# Patient Record
Sex: Female | Born: 1970 | Race: White | Hispanic: No | Marital: Married | Smoking: Never smoker
Health system: Southern US, Community
[De-identification: ages and names within clinical notes are randomized; demographics above are authoritative.]

## PROBLEM LIST (undated history)

## (undated) DIAGNOSIS — K5900 Constipation, unspecified: Secondary | ICD-10-CM

## (undated) DIAGNOSIS — R768 Other specified abnormal immunological findings in serum: Secondary | ICD-10-CM

## (undated) DIAGNOSIS — D509 Iron deficiency anemia, unspecified: Secondary | ICD-10-CM

## (undated) DIAGNOSIS — C8307 Small cell B-cell lymphoma, spleen: Secondary | ICD-10-CM

## (undated) DIAGNOSIS — N924 Excessive bleeding in the premenopausal period: Secondary | ICD-10-CM

## (undated) DIAGNOSIS — D472 Monoclonal gammopathy: Secondary | ICD-10-CM

## (undated) HISTORY — DX: Small cell b-cell lymphoma, spleen: C83.07

## (undated) HISTORY — DX: Iron deficiency anemia, unspecified: D50.9

## (undated) HISTORY — DX: Constipation, unspecified: K59.00

## (undated) HISTORY — DX: Monoclonal gammopathy: D47.2

## (undated) HISTORY — DX: Excessive bleeding in the premenopausal period: N92.4

## (undated) HISTORY — DX: Other specified abnormal immunological findings in serum: R76.8

## (undated) HISTORY — PX: CHOLECYSTECTOMY: SHX55

---

## 1998-07-09 ENCOUNTER — Other Ambulatory Visit: Admission: RE | Admit: 1998-07-09 | Discharge: 1998-07-09 | Payer: Self-pay | Admitting: Obstetrics and Gynecology

## 1999-08-02 ENCOUNTER — Other Ambulatory Visit: Admission: RE | Admit: 1999-08-02 | Discharge: 1999-08-02 | Payer: Self-pay | Admitting: Obstetrics and Gynecology

## 2000-07-10 ENCOUNTER — Other Ambulatory Visit: Admission: RE | Admit: 2000-07-10 | Discharge: 2000-07-10 | Payer: Self-pay | Admitting: Obstetrics and Gynecology

## 2001-08-09 ENCOUNTER — Other Ambulatory Visit: Admission: RE | Admit: 2001-08-09 | Discharge: 2001-08-09 | Payer: Self-pay | Admitting: Obstetrics and Gynecology

## 2002-05-12 ENCOUNTER — Other Ambulatory Visit: Admission: RE | Admit: 2002-05-12 | Discharge: 2002-05-12 | Payer: Self-pay | Admitting: Obstetrics and Gynecology

## 2002-12-05 ENCOUNTER — Inpatient Hospital Stay (HOSPITAL_COMMUNITY): Admission: AD | Admit: 2002-12-05 | Discharge: 2002-12-08 | Payer: Self-pay | Admitting: Obstetrics and Gynecology

## 2002-12-09 ENCOUNTER — Encounter: Admission: RE | Admit: 2002-12-09 | Discharge: 2003-01-08 | Payer: Self-pay | Admitting: Obstetrics and Gynecology

## 2002-12-15 ENCOUNTER — Inpatient Hospital Stay (HOSPITAL_COMMUNITY): Admission: AD | Admit: 2002-12-15 | Discharge: 2002-12-15 | Payer: Self-pay | Admitting: Obstetrics & Gynecology

## 2002-12-15 ENCOUNTER — Encounter: Payer: Self-pay | Admitting: Obstetrics & Gynecology

## 2003-01-16 ENCOUNTER — Other Ambulatory Visit: Admission: RE | Admit: 2003-01-16 | Discharge: 2003-01-16 | Payer: Self-pay | Admitting: Obstetrics and Gynecology

## 2004-01-18 ENCOUNTER — Other Ambulatory Visit: Admission: RE | Admit: 2004-01-18 | Discharge: 2004-01-18 | Payer: Self-pay | Admitting: Obstetrics and Gynecology

## 2004-08-08 ENCOUNTER — Ambulatory Visit: Payer: Self-pay | Admitting: Internal Medicine

## 2005-03-06 ENCOUNTER — Other Ambulatory Visit: Admission: RE | Admit: 2005-03-06 | Discharge: 2005-03-06 | Payer: Self-pay | Admitting: Obstetrics and Gynecology

## 2005-04-07 ENCOUNTER — Ambulatory Visit: Payer: Self-pay | Admitting: Unknown Physician Specialty

## 2005-04-23 ENCOUNTER — Ambulatory Visit: Payer: Self-pay | Admitting: Unknown Physician Specialty

## 2005-06-17 ENCOUNTER — Ambulatory Visit: Payer: Self-pay | Admitting: Surgery

## 2005-08-15 ENCOUNTER — Ambulatory Visit: Payer: Self-pay | Admitting: Psychiatry

## 2006-06-18 ENCOUNTER — Ambulatory Visit: Payer: Self-pay | Admitting: Psychiatry

## 2006-06-22 ENCOUNTER — Ambulatory Visit: Payer: Self-pay | Admitting: Obstetrics and Gynecology

## 2006-07-03 ENCOUNTER — Ambulatory Visit: Payer: Self-pay | Admitting: Obstetrics and Gynecology

## 2007-01-07 ENCOUNTER — Ambulatory Visit (HOSPITAL_COMMUNITY): Admission: RE | Admit: 2007-01-07 | Discharge: 2007-01-07 | Payer: Self-pay | Admitting: Obstetrics and Gynecology

## 2007-02-11 ENCOUNTER — Ambulatory Visit (HOSPITAL_COMMUNITY): Admission: RE | Admit: 2007-02-11 | Discharge: 2007-02-11 | Payer: Self-pay | Admitting: Obstetrics and Gynecology

## 2007-03-11 ENCOUNTER — Ambulatory Visit (HOSPITAL_COMMUNITY): Admission: RE | Admit: 2007-03-11 | Discharge: 2007-03-11 | Payer: Self-pay | Admitting: Obstetrics and Gynecology

## 2007-03-15 ENCOUNTER — Ambulatory Visit: Payer: Self-pay | Admitting: Oncology

## 2007-03-17 ENCOUNTER — Ambulatory Visit (HOSPITAL_COMMUNITY): Admission: RE | Admit: 2007-03-17 | Discharge: 2007-03-17 | Payer: Self-pay | Admitting: Oncology

## 2007-03-17 LAB — CBC & DIFF AND RETIC
Basophils Absolute: 0 10*3/uL (ref 0.0–0.1)
EOS%: 1 % (ref 0.0–7.0)
HCT: 32.2 % — ABNORMAL LOW (ref 34.8–46.6)
HGB: 11.7 g/dL (ref 11.6–15.9)
IRF: 0.43 — ABNORMAL HIGH (ref 0.130–0.330)
MCH: 33.6 pg (ref 26.0–34.0)
MONO#: 0.4 10*3/uL (ref 0.1–0.9)
NEUT#: 5.9 10*3/uL (ref 1.5–6.5)
NEUT%: 67.7 % (ref 39.6–76.8)
RDW: 13.9 % (ref 11.3–14.5)
RETIC #: 116.2 10*3/uL — ABNORMAL HIGH (ref 19.7–115.1)
WBC: 8.7 10*3/uL (ref 3.9–10.0)
lymph#: 2.3 10*3/uL (ref 0.9–3.3)

## 2007-03-17 LAB — CHCC SMEAR

## 2007-03-17 LAB — MORPHOLOGY: PLT EST: ADEQUATE

## 2007-03-19 LAB — COMPREHENSIVE METABOLIC PANEL
ALT: 13 U/L (ref 0–35)
AST: 12 U/L (ref 0–37)
Calcium: 8.6 mg/dL (ref 8.4–10.5)
Chloride: 106 mEq/L (ref 96–112)
Creatinine, Ser: 0.69 mg/dL (ref 0.40–1.20)
Sodium: 138 mEq/L (ref 135–145)
Total Bilirubin: 0.4 mg/dL (ref 0.3–1.2)
Total Protein: 6.6 g/dL (ref 6.0–8.3)

## 2007-03-19 LAB — IMMUNOFIXATION ELECTROPHORESIS
IgA: 265 mg/dL (ref 68–378)
IgM, Serum: 376 mg/dL — ABNORMAL HIGH (ref 60–263)

## 2007-03-31 ENCOUNTER — Ambulatory Visit (HOSPITAL_COMMUNITY): Admission: RE | Admit: 2007-03-31 | Discharge: 2007-03-31 | Payer: Self-pay | Admitting: Obstetrics and Gynecology

## 2007-04-29 ENCOUNTER — Ambulatory Visit: Payer: Self-pay | Admitting: Oncology

## 2007-05-03 ENCOUNTER — Ambulatory Visit (HOSPITAL_COMMUNITY): Admission: RE | Admit: 2007-05-03 | Discharge: 2007-05-03 | Payer: Self-pay | Admitting: Obstetrics and Gynecology

## 2007-05-03 LAB — CBC & DIFF AND RETIC
Eosinophils Absolute: 0.1 10*3/uL (ref 0.0–0.5)
LYMPH%: 25.5 % (ref 14.0–48.0)
MCV: 92.7 fL (ref 81.0–101.0)
MONO%: 4.3 % (ref 0.0–13.0)
NEUT#: 5.8 10*3/uL (ref 1.5–6.5)
Platelets: 192 10*3/uL (ref 145–400)
RBC: 3.31 10*6/uL — ABNORMAL LOW (ref 3.70–5.32)
Retic %: 3.2 % — ABNORMAL HIGH (ref 0.4–2.3)

## 2007-05-11 ENCOUNTER — Ambulatory Visit (HOSPITAL_COMMUNITY): Admission: RE | Admit: 2007-05-11 | Discharge: 2007-05-11 | Payer: Self-pay | Admitting: Obstetrics and Gynecology

## 2007-05-18 ENCOUNTER — Ambulatory Visit (HOSPITAL_COMMUNITY): Admission: RE | Admit: 2007-05-18 | Discharge: 2007-05-18 | Payer: Self-pay | Admitting: Obstetrics and Gynecology

## 2007-05-18 LAB — CBC & DIFF AND RETIC
Basophils Absolute: 0.1 10*3/uL (ref 0.0–0.1)
Eosinophils Absolute: 0.1 10*3/uL (ref 0.0–0.5)
HCT: 30.6 % — ABNORMAL LOW (ref 34.8–46.6)
HGB: 11.1 g/dL — ABNORMAL LOW (ref 11.6–15.9)
LYMPH%: 24.6 % (ref 14.0–48.0)
MCV: 90 fL (ref 81.0–101.0)
MONO%: 3.7 % (ref 0.0–13.0)
NEUT#: 6.2 10*3/uL (ref 1.5–6.5)
NEUT%: 70.2 % (ref 39.6–76.8)
Platelets: 179 10*3/uL (ref 145–400)
RBC: 3.4 10*6/uL — ABNORMAL LOW (ref 3.70–5.32)

## 2007-05-25 ENCOUNTER — Ambulatory Visit (HOSPITAL_COMMUNITY): Admission: RE | Admit: 2007-05-25 | Discharge: 2007-05-25 | Payer: Self-pay | Admitting: Obstetrics and Gynecology

## 2007-06-01 ENCOUNTER — Ambulatory Visit (HOSPITAL_COMMUNITY): Admission: RE | Admit: 2007-06-01 | Discharge: 2007-06-01 | Payer: Self-pay | Admitting: Obstetrics and Gynecology

## 2007-06-01 LAB — CBC & DIFF AND RETIC
Basophils Absolute: 0 10*3/uL (ref 0.0–0.1)
Eosinophils Absolute: 0.1 10*3/uL (ref 0.0–0.5)
HGB: 11.2 g/dL — ABNORMAL LOW (ref 11.6–15.9)
LYMPH%: 25.9 % (ref 14.0–48.0)
MCV: 92.2 fL (ref 81.0–101.0)
MONO#: 0.4 10*3/uL (ref 0.1–0.9)
MONO%: 4.4 % (ref 0.0–13.0)
NEUT#: 6.3 10*3/uL (ref 1.5–6.5)
Platelets: 195 10*3/uL (ref 145–400)
RETIC #: 95.9 10*3/uL (ref 19.7–115.1)

## 2007-06-08 ENCOUNTER — Ambulatory Visit (HOSPITAL_COMMUNITY): Admission: RE | Admit: 2007-06-08 | Discharge: 2007-06-08 | Payer: Self-pay | Admitting: Obstetrics and Gynecology

## 2007-06-10 ENCOUNTER — Ambulatory Visit: Payer: Self-pay | Admitting: Oncology

## 2007-06-15 LAB — CBC & DIFF AND RETIC
Basophils Absolute: 0 10*3/uL (ref 0.0–0.1)
Eosinophils Absolute: 0.1 10*3/uL (ref 0.0–0.5)
HGB: 11.7 g/dL (ref 11.6–15.9)
MCV: 91.6 fL (ref 81.0–101.0)
MONO#: 0.3 10*3/uL (ref 0.1–0.9)
MONO%: 3.5 % (ref 0.0–13.0)
NEUT#: 6.6 10*3/uL — ABNORMAL HIGH (ref 1.5–6.5)
Platelets: 195 10*3/uL (ref 145–400)
RDW: 14 % (ref 11.3–14.5)
RETIC #: 107.1 10*3/uL (ref 19.7–115.1)
Retic %: 3.1 % — ABNORMAL HIGH (ref 0.4–2.3)
WBC: 9.4 10*3/uL (ref 3.9–10.0)

## 2007-06-15 LAB — CHCC SMEAR

## 2007-06-16 ENCOUNTER — Ambulatory Visit (HOSPITAL_COMMUNITY): Admission: RE | Admit: 2007-06-16 | Discharge: 2007-06-16 | Payer: Self-pay | Admitting: Obstetrics and Gynecology

## 2007-06-17 LAB — COMPREHENSIVE METABOLIC PANEL
ALT: 10 U/L (ref 0–35)
AST: 11 U/L (ref 0–37)
Albumin: 3.4 g/dL — ABNORMAL LOW (ref 3.5–5.2)
Alkaline Phosphatase: 95 U/L (ref 39–117)
Glucose, Bld: 107 mg/dL — ABNORMAL HIGH (ref 70–99)
Potassium: 3.8 mEq/L (ref 3.5–5.3)
Sodium: 136 mEq/L (ref 135–145)
Total Bilirubin: 0.5 mg/dL (ref 0.3–1.2)
Total Protein: 6.6 g/dL (ref 6.0–8.3)

## 2007-06-17 LAB — IMMUNOFIXATION ELECTROPHORESIS
IgA: 274 mg/dL (ref 68–378)
IgG (Immunoglobin G), Serum: 823 mg/dL (ref 694–1618)

## 2007-06-17 LAB — DIRECT ANTIGLOBULIN TEST (NOT AT ARMC): DAT IgG: POSITIVE — AB

## 2007-06-21 ENCOUNTER — Ambulatory Visit (HOSPITAL_COMMUNITY): Admission: RE | Admit: 2007-06-21 | Discharge: 2007-06-21 | Payer: Self-pay | Admitting: Obstetrics and Gynecology

## 2007-06-30 ENCOUNTER — Ambulatory Visit (HOSPITAL_COMMUNITY): Admission: RE | Admit: 2007-06-30 | Discharge: 2007-06-30 | Payer: Self-pay | Admitting: Obstetrics and Gynecology

## 2007-06-30 LAB — CBC & DIFF AND RETIC
Basophils Absolute: 0 10*3/uL (ref 0.0–0.1)
EOS%: 0.6 % (ref 0.0–7.0)
HGB: 11.9 g/dL (ref 11.6–15.9)
MCH: 32.4 pg (ref 26.0–34.0)
MCV: 89.3 fL (ref 81.0–101.0)
MONO%: 3.7 % (ref 0.0–13.0)
RBC: 3.68 10*6/uL — ABNORMAL LOW (ref 3.70–5.32)
RDW: 12 % (ref 11.3–14.5)
RETIC #: 116.7 10*3/uL — ABNORMAL HIGH (ref 19.7–115.1)

## 2007-07-06 ENCOUNTER — Ambulatory Visit (HOSPITAL_COMMUNITY): Admission: RE | Admit: 2007-07-06 | Discharge: 2007-07-06 | Payer: Self-pay | Admitting: Obstetrics and Gynecology

## 2007-07-08 ENCOUNTER — Inpatient Hospital Stay (HOSPITAL_COMMUNITY): Admission: AD | Admit: 2007-07-08 | Discharge: 2007-07-08 | Payer: Self-pay | Admitting: Obstetrics & Gynecology

## 2007-07-14 ENCOUNTER — Ambulatory Visit (HOSPITAL_COMMUNITY): Admission: RE | Admit: 2007-07-14 | Discharge: 2007-07-14 | Payer: Self-pay | Admitting: Obstetrics and Gynecology

## 2007-07-18 ENCOUNTER — Inpatient Hospital Stay (HOSPITAL_COMMUNITY): Admission: AD | Admit: 2007-07-18 | Discharge: 2007-07-21 | Payer: Self-pay | Admitting: Obstetrics and Gynecology

## 2007-07-20 ENCOUNTER — Encounter (INDEPENDENT_AMBULATORY_CARE_PROVIDER_SITE_OTHER): Payer: Self-pay | Admitting: Obstetrics and Gynecology

## 2007-07-29 ENCOUNTER — Ambulatory Visit: Payer: Self-pay | Admitting: Oncology

## 2007-08-11 LAB — CBC & DIFF AND RETIC
Basophils Absolute: 0 10*3/uL (ref 0.0–0.1)
EOS%: 1.9 % (ref 0.0–7.0)
HGB: 14.5 g/dL (ref 11.6–15.9)
LYMPH%: 34.9 % (ref 14.0–48.0)
MCH: 32.3 pg (ref 26.0–34.0)
MCV: 89.6 fL (ref 81.0–101.0)
MONO%: 6.2 % (ref 0.0–13.0)
NEUT%: 56.7 % (ref 39.6–76.8)
RDW: 13.6 % (ref 11.3–14.5)
RETIC #: 92 10*3/uL (ref 19.7–115.1)

## 2007-08-11 LAB — COMPREHENSIVE METABOLIC PANEL
ALT: 34 U/L (ref 0–35)
Albumin: 4 g/dL (ref 3.5–5.2)
Alkaline Phosphatase: 105 U/L (ref 39–117)
Glucose, Bld: 91 mg/dL (ref 70–99)
Potassium: 4.4 mEq/L (ref 3.5–5.3)
Sodium: 140 mEq/L (ref 135–145)
Total Protein: 7 g/dL (ref 6.0–8.3)

## 2007-08-27 LAB — DIRECT ANTIGLOBULIN TEST (NOT AT ARMC): DAT IgG: POSITIVE — AB

## 2007-12-08 ENCOUNTER — Ambulatory Visit: Payer: Self-pay | Admitting: Obstetrics and Gynecology

## 2008-02-04 ENCOUNTER — Ambulatory Visit: Payer: Self-pay | Admitting: Oncology

## 2008-02-08 ENCOUNTER — Encounter: Admission: RE | Admit: 2008-02-08 | Discharge: 2008-02-08 | Payer: Self-pay | Admitting: General Surgery

## 2008-02-08 LAB — CBC & DIFF AND RETIC
BASO%: 0.7 % (ref 0.0–2.0)
EOS%: 2 % (ref 0.0–7.0)
IRF: 0.37 — ABNORMAL HIGH (ref 0.130–0.330)
MCH: 30.1 pg (ref 26.0–34.0)
MCHC: 34.5 g/dL (ref 32.0–36.0)
MONO#: 0.4 10*3/uL (ref 0.1–0.9)
RBC: 3.8 10*6/uL (ref 3.70–5.32)
Retic %: 3.6 % — ABNORMAL HIGH (ref 0.4–2.3)
WBC: 7 10*3/uL (ref 3.9–10.0)
lymph#: 3 10*3/uL (ref 0.9–3.3)

## 2008-02-09 LAB — DIRECT ANTIGLOBULIN TEST (NOT AT ARMC): DAT IgG: POSITIVE — AB

## 2008-03-14 LAB — COMPREHENSIVE METABOLIC PANEL
Albumin: 4.2 g/dL (ref 3.5–5.2)
Alkaline Phosphatase: 68 U/L (ref 39–117)
BUN: 13 mg/dL (ref 6–23)
Glucose, Bld: 94 mg/dL (ref 70–99)
Total Bilirubin: 0.6 mg/dL (ref 0.3–1.2)

## 2008-03-14 LAB — CBC & DIFF AND RETIC
BASO%: 0.4 % (ref 0.0–2.0)
HCT: 35.2 % (ref 34.8–46.6)
IRF: 0.36 — ABNORMAL HIGH (ref 0.130–0.330)
MCHC: 36.2 g/dL — ABNORMAL HIGH (ref 32.0–36.0)
MONO#: 0.3 10*3/uL (ref 0.1–0.9)
RBC: 4.02 10*6/uL (ref 3.70–5.32)
RDW: 14.1 % (ref 11.3–14.5)
RETIC #: 98.1 10*3/uL (ref 19.7–115.1)
Retic %: 2.4 % — ABNORMAL HIGH (ref 0.4–2.3)
WBC: 5.5 10*3/uL (ref 3.9–10.0)
lymph#: 2.4 10*3/uL (ref 0.9–3.3)

## 2008-03-14 LAB — LACTATE DEHYDROGENASE: LDH: 140 U/L (ref 94–250)

## 2008-03-14 LAB — BILIRUBIN, DIRECT: Bilirubin, Direct: 0.1 mg/dL (ref 0.0–0.3)

## 2008-05-04 ENCOUNTER — Ambulatory Visit: Payer: Self-pay | Admitting: Oncology

## 2008-05-09 LAB — CBC & DIFF AND RETIC
BASO%: 0.2 % (ref 0.0–2.0)
EOS%: 1.4 % (ref 0.0–7.0)
LYMPH%: 38.9 % (ref 14.0–48.0)
MCHC: 35.4 g/dL (ref 32.0–36.0)
MCV: 89.5 fL (ref 81.0–101.0)
MONO%: 5.1 % (ref 0.0–13.0)
Platelets: 171 10*3/uL (ref 145–400)
RBC: 3.84 10*6/uL (ref 3.70–5.32)
Retic %: 2.4 % — ABNORMAL HIGH (ref 0.4–2.3)
WBC: 5.9 10*3/uL (ref 3.9–10.0)

## 2008-06-30 ENCOUNTER — Ambulatory Visit: Payer: Self-pay | Admitting: Oncology

## 2008-07-14 LAB — MORPHOLOGY: PLT EST: ADEQUATE

## 2008-07-14 LAB — CBC & DIFF AND RETIC
BASO%: 0.3 % (ref 0.0–2.0)
LYMPH%: 38.5 % (ref 14.0–48.0)
MCHC: 35.4 g/dL (ref 32.0–36.0)
MONO#: 0.4 10*3/uL (ref 0.1–0.9)
MONO%: 5.9 % (ref 0.0–13.0)
Platelets: 184 10*3/uL (ref 145–400)
RBC: 4.14 10*6/uL (ref 3.70–5.32)
Retic %: 2.5 % — ABNORMAL HIGH (ref 0.4–2.3)
WBC: 6.4 10*3/uL (ref 3.9–10.0)

## 2008-09-21 ENCOUNTER — Ambulatory Visit: Payer: Self-pay | Admitting: Obstetrics and Gynecology

## 2008-11-09 ENCOUNTER — Ambulatory Visit: Payer: Self-pay | Admitting: Oncology

## 2009-03-13 ENCOUNTER — Ambulatory Visit: Payer: Self-pay | Admitting: Oncology

## 2009-03-15 LAB — CBC & DIFF AND RETIC
BASO%: 0.2 % (ref 0.0–2.0)
IRF: 0.39 — ABNORMAL HIGH (ref 0.130–0.330)
LYMPH%: 42.9 % (ref 14.0–49.7)
MCHC: 35.5 g/dL (ref 31.5–36.0)
MONO#: 0.4 10*3/uL (ref 0.1–0.9)
NEUT#: 3.9 10*3/uL (ref 1.5–6.5)
RBC: 4.12 10*6/uL (ref 3.70–5.45)
RDW: 14.3 % (ref 11.2–14.5)
RETIC #: 126.5 10*3/uL — ABNORMAL HIGH (ref 19.7–115.1)
Retic %: 3.1 % — ABNORMAL HIGH (ref 0.4–2.3)
WBC: 7.9 10*3/uL (ref 3.9–10.3)
lymph#: 3.4 10*3/uL — ABNORMAL HIGH (ref 0.9–3.3)

## 2009-03-15 LAB — LACTATE DEHYDROGENASE: LDH: 156 U/L (ref 94–250)

## 2009-07-12 ENCOUNTER — Ambulatory Visit: Payer: Self-pay | Admitting: Oncology

## 2009-07-16 LAB — MORPHOLOGY: PLT EST: ADEQUATE

## 2009-07-16 LAB — CBC & DIFF AND RETIC
Basophils Absolute: 0 10*3/uL (ref 0.0–0.1)
Eosinophils Absolute: 0.1 10*3/uL (ref 0.0–0.5)
HCT: 36.6 % (ref 34.8–46.6)
HGB: 12.2 g/dL (ref 11.6–15.9)
Immature Retic Fract: 0.8 % (ref 0.00–10.70)
NEUT#: 2.6 10*3/uL (ref 1.5–6.5)
NEUT%: 43.6 % (ref 38.4–76.8)
RDW: 13.7 % (ref 11.2–14.5)
Retic Ct Abs: 73.33 10*3/uL — ABNORMAL HIGH (ref 18.30–72.70)
lymph#: 2.9 10*3/uL (ref 0.9–3.3)

## 2009-07-16 LAB — CHCC SMEAR

## 2009-07-16 LAB — BILIRUBIN, DIRECT: Bilirubin, Direct: <0.1 mg/dL (ref 0.0–0.3)

## 2009-07-16 LAB — LACTATE DEHYDROGENASE: LDH: 125 U/L (ref 94–250)

## 2009-12-04 ENCOUNTER — Inpatient Hospital Stay: Payer: Self-pay | Admitting: Unknown Physician Specialty

## 2009-12-21 ENCOUNTER — Ambulatory Visit: Payer: Self-pay | Admitting: Oncology

## 2009-12-25 LAB — MORPHOLOGY: RBC Comments: NORMAL

## 2009-12-25 LAB — CBC & DIFF AND RETIC
BASO%: 0.1 % (ref 0.0–2.0)
Eosinophils Absolute: 0.1 10*3/uL (ref 0.0–0.5)
Immature Retic Fract: 2.2 % (ref 0.00–10.70)
LYMPH%: 26.2 % (ref 14.0–49.7)
MCHC: 34.1 g/dL (ref 31.5–36.0)
MONO#: 0.4 10*3/uL (ref 0.1–0.9)
NEUT#: 4.4 10*3/uL (ref 1.5–6.5)
Platelets: 213 10*3/uL (ref 145–400)
RBC: 4.18 10*6/uL (ref 3.70–5.45)
RDW: 13.3 % (ref 11.2–14.5)
Retic %: 0.93 % (ref 0.50–1.50)
Retic Ct Abs: 38.87 10*3/uL (ref 18.30–72.70)
WBC: 6.7 10*3/uL (ref 3.9–10.3)
lymph#: 1.8 10*3/uL (ref 0.9–3.3)

## 2009-12-26 LAB — COMPREHENSIVE METABOLIC PANEL
AST: 45 U/L — ABNORMAL HIGH (ref 0–37)
Albumin: 4.2 g/dL (ref 3.5–5.2)
Alkaline Phosphatase: 46 U/L (ref 39–117)
Potassium: 4.1 mEq/L (ref 3.5–5.3)
Sodium: 137 mEq/L (ref 135–145)
Total Bilirubin: 0.5 mg/dL (ref 0.3–1.2)
Total Protein: 7 g/dL (ref 6.0–8.3)

## 2009-12-26 LAB — KAPPA/LAMBDA LIGHT CHAINS
Kappa:Lambda Ratio: 1.2 (ref 0.26–1.65)
Lambda Free Lght Chn: 1.19 mg/dL (ref 0.57–2.63)

## 2009-12-26 LAB — IGM: IgM, Serum: 425 mg/dL — ABNORMAL HIGH (ref 60–263)

## 2010-01-02 ENCOUNTER — Ambulatory Visit (HOSPITAL_COMMUNITY): Admission: RE | Admit: 2010-01-02 | Discharge: 2010-01-02 | Payer: Self-pay | Admitting: Oncology

## 2010-01-11 LAB — UIFE/LIGHT CHAINS/TP QN, 24-HR UR
Alpha 1, Urine: DETECTED — AB
Alpha 2, Urine: DETECTED — AB
Free Kappa Lt Chains,Ur: 8.81 mg/dL — ABNORMAL HIGH (ref 0.04–1.51)
Free Kappa/Lambda Ratio: 11.75 ratio — ABNORMAL HIGH (ref 0.46–4.00)
Free Lambda Excretion/Day: 9 mg/d
Free Lt Chn Excr Rate: 105.72 mg/d
Gamma Globulin, Urine: DETECTED — AB
Time: 24 hours
Total Protein, Urine: 55.6 mg/dL

## 2010-01-14 LAB — COMPREHENSIVE METABOLIC PANEL
BUN: 12 mg/dL (ref 6–23)
CO2: 23 mEq/L (ref 19–32)
Calcium: 9.3 mg/dL (ref 8.4–10.5)
Chloride: 103 mEq/L (ref 96–112)
Creatinine, Ser: 0.82 mg/dL (ref 0.40–1.20)
Glucose, Bld: 83 mg/dL (ref 70–99)

## 2010-01-14 LAB — CBC & DIFF AND RETIC
Basophils Absolute: 0 10*3/uL (ref 0.0–0.1)
EOS%: 1.4 % (ref 0.0–7.0)
HGB: 12.8 g/dL (ref 11.6–15.9)
MCH: 29.8 pg (ref 25.1–34.0)
MCV: 87.4 fL (ref 79.5–101.0)
MONO%: 5.7 % (ref 0.0–14.0)
NEUT%: 57.7 % (ref 38.4–76.8)
RDW: 13.5 % (ref 11.2–14.5)

## 2010-01-14 LAB — LACTATE DEHYDROGENASE: LDH: 122 U/L (ref 94–250)

## 2010-01-29 ENCOUNTER — Ambulatory Visit: Payer: Self-pay | Admitting: Unknown Physician Specialty

## 2010-02-26 ENCOUNTER — Ambulatory Visit: Payer: Self-pay

## 2010-07-04 ENCOUNTER — Ambulatory Visit: Payer: Self-pay | Admitting: Oncology

## 2010-07-15 LAB — CBC & DIFF AND RETIC
Basophils Absolute: 0 10*3/uL (ref 0.0–0.1)
Eosinophils Absolute: 0.1 10*3/uL (ref 0.0–0.5)
HCT: 37 % (ref 34.8–46.6)
LYMPH%: 37.8 % (ref 14.0–49.7)
MCHC: 34.6 g/dL (ref 31.5–36.0)
MONO#: 0.2 10*3/uL (ref 0.1–0.9)
NEUT#: 2.6 10*3/uL (ref 1.5–6.5)
NEUT%: 54.9 % (ref 38.4–76.8)
Platelets: 156 10*3/uL (ref 145–400)
WBC: 4.7 10*3/uL (ref 3.9–10.3)

## 2010-07-15 LAB — MORPHOLOGY: RBC Comments: NORMAL

## 2010-07-18 LAB — IMMUNOFIXATION ELECTROPHORESIS
IgA: 306 mg/dL (ref 68–378)
IgG (Immunoglobin G), Serum: 909 mg/dL (ref 694–1618)
Total Protein, Serum Electrophoresis: 6.4 g/dL (ref 6.0–8.3)

## 2010-07-18 LAB — DIRECT ANTIGLOBULIN TEST (NOT AT ARMC)
DAT (Complement): NEGATIVE
DAT IgG: NEGATIVE

## 2010-07-19 LAB — UIFE/LIGHT CHAINS/TP QN, 24-HR UR
Alpha 2, Urine: DETECTED — AB
Free Kappa Lt Chains,Ur: 0.65 mg/dL (ref 0.04–1.51)
Free Lt Chn Excr Rate: 3.9 mg/d
Time: 24 hours
Total Protein, Urine: 1.5 mg/dL

## 2010-07-19 LAB — CREATININE CLEARANCE, URINE, 24 HOUR
Creatinine Clearance: 87 mL/min (ref 75–115)
Creatinine, 24H Ur: 1042 mg/d (ref 700–1800)
Urine Total Volume-CRCL: 600 mL

## 2010-08-28 ENCOUNTER — Ambulatory Visit: Payer: Self-pay | Admitting: Unknown Physician Specialty

## 2011-02-03 ENCOUNTER — Other Ambulatory Visit: Payer: Self-pay | Admitting: Oncology

## 2011-02-03 ENCOUNTER — Encounter (HOSPITAL_BASED_OUTPATIENT_CLINIC_OR_DEPARTMENT_OTHER): Payer: PRIVATE HEALTH INSURANCE | Admitting: Oncology

## 2011-02-03 DIAGNOSIS — R799 Abnormal finding of blood chemistry, unspecified: Secondary | ICD-10-CM

## 2011-02-03 DIAGNOSIS — R809 Proteinuria, unspecified: Secondary | ICD-10-CM

## 2011-02-03 DIAGNOSIS — D472 Monoclonal gammopathy: Secondary | ICD-10-CM

## 2011-02-03 LAB — CBC & DIFF AND RETIC
Basophils Absolute: 0 10*3/uL (ref 0.0–0.1)
EOS%: 3.6 % (ref 0.0–7.0)
Eosinophils Absolute: 0.2 10*3/uL (ref 0.0–0.5)
HGB: 11.6 g/dL (ref 11.6–15.9)
LYMPH%: 36.1 % (ref 14.0–49.7)
MCH: 27.9 pg (ref 25.1–34.0)
MCV: 83.9 fL (ref 79.5–101.0)
MONO%: 5.4 % (ref 0.0–14.0)
NEUT#: 3 10*3/uL (ref 1.5–6.5)
Platelets: 183 10*3/uL (ref 145–400)
RDW: 13.9 % (ref 11.2–14.5)

## 2011-02-04 LAB — KAPPA/LAMBDA LIGHT CHAINS
Kappa free light chain: 1.83 mg/dL (ref 0.33–1.94)
Kappa:Lambda Ratio: 1.08 (ref 0.26–1.65)
Lambda Free Lght Chn: 1.69 mg/dL (ref 0.57–2.63)

## 2011-02-04 LAB — IGM: IgM, Serum: 580 mg/dL — ABNORMAL HIGH (ref 60–263)

## 2011-02-06 ENCOUNTER — Other Ambulatory Visit: Payer: Self-pay | Admitting: Oncology

## 2011-02-10 LAB — CREATININE CLEARANCE, URINE, 24 HOUR
Creatinine, Urine: 107.2 mg/dL
Creatinine: 0.91 mg/dL (ref 0.40–1.20)

## 2011-02-10 LAB — UIFE/LIGHT CHAINS/TP QN, 24-HR UR
Albumin, U: DETECTED
Beta, Urine: DETECTED — AB
Free Lambda Lt Chains,Ur: <0.04 mg/dL — ABNORMAL LOW (ref 0.08–1.01)
Total Protein, Urine-Ur/day: 18 mg/d (ref 10–140)
Volume, Urine: 1600 mL

## 2011-05-14 ENCOUNTER — Other Ambulatory Visit: Payer: Self-pay | Admitting: Oncology

## 2011-05-14 ENCOUNTER — Encounter (HOSPITAL_BASED_OUTPATIENT_CLINIC_OR_DEPARTMENT_OTHER): Payer: PRIVATE HEALTH INSURANCE | Admitting: Oncology

## 2011-05-14 DIAGNOSIS — R809 Proteinuria, unspecified: Secondary | ICD-10-CM

## 2011-05-14 DIAGNOSIS — R799 Abnormal finding of blood chemistry, unspecified: Secondary | ICD-10-CM

## 2011-05-14 DIAGNOSIS — D472 Monoclonal gammopathy: Secondary | ICD-10-CM

## 2011-05-14 LAB — CBC WITH DIFFERENTIAL/PLATELET
Basophils Absolute: 0 10*3/uL (ref 0.0–0.1)
Eosinophils Absolute: 0.1 10*3/uL (ref 0.0–0.5)
HGB: 11.3 g/dL — ABNORMAL LOW (ref 11.6–15.9)
LYMPH%: 28.6 % (ref 14.0–49.7)
MCV: 84.8 fL (ref 79.5–101.0)
MONO%: 6.9 % (ref 0.0–14.0)
NEUT#: 2.7 10*3/uL (ref 1.5–6.5)
Platelets: 146 10*3/uL (ref 145–400)

## 2011-05-16 LAB — IMMUNOFIXATION ELECTROPHORESIS
IgM, Serum: 530 mg/dL — ABNORMAL HIGH (ref 52–322)
Total Protein, Serum Electrophoresis: 6.8 g/dL (ref 6.0–8.3)

## 2011-05-16 LAB — KAPPA/LAMBDA LIGHT CHAINS: Kappa:Lambda Ratio: 1.4 (ref 0.26–1.65)

## 2011-05-16 LAB — RHEUMATOID FACTOR: Rhuematoid fact SerPl-aCnc: <10 IU/mL (ref <=14)

## 2011-06-17 ENCOUNTER — Ambulatory Visit: Payer: Self-pay

## 2011-07-24 LAB — CBC
HCT: 32 — ABNORMAL LOW
HCT: 37.2
Hemoglobin: 11.5 — ABNORMAL LOW
Hemoglobin: 13.6
MCHC: 36.6 — ABNORMAL HIGH
MCV: 93.2
MCV: 94.2
RBC: 3.4 — ABNORMAL LOW
RDW: 13.8
WBC: 12.2 — ABNORMAL HIGH

## 2011-07-24 LAB — RPR: RPR Ser Ql: NONREACTIVE

## 2011-08-04 ENCOUNTER — Other Ambulatory Visit: Payer: Self-pay | Admitting: Oncology

## 2011-08-04 ENCOUNTER — Encounter (HOSPITAL_BASED_OUTPATIENT_CLINIC_OR_DEPARTMENT_OTHER): Payer: PRIVATE HEALTH INSURANCE | Admitting: Oncology

## 2011-08-04 DIAGNOSIS — D472 Monoclonal gammopathy: Secondary | ICD-10-CM

## 2011-08-04 DIAGNOSIS — R799 Abnormal finding of blood chemistry, unspecified: Secondary | ICD-10-CM

## 2011-08-04 DIAGNOSIS — R809 Proteinuria, unspecified: Secondary | ICD-10-CM

## 2011-08-04 LAB — CBC WITH DIFFERENTIAL/PLATELET
Basophils Absolute: 0 10*3/uL (ref 0.0–0.1)
Eosinophils Absolute: 0.1 10*3/uL (ref 0.0–0.5)
HCT: 32.3 % — ABNORMAL LOW (ref 34.8–46.6)
HGB: 11.2 g/dL — ABNORMAL LOW (ref 11.6–15.9)
LYMPH%: 32.3 % (ref 14.0–49.7)
MCV: 83.9 fL (ref 79.5–101.0)
MONO#: 0.3 10*3/uL (ref 0.1–0.9)
NEUT#: 2.6 10*3/uL (ref 1.5–6.5)
NEUT%: 58.6 % (ref 38.4–76.8)
Platelets: 169 10*3/uL (ref 145–400)
RBC: 3.85 10*6/uL (ref 3.70–5.45)
WBC: 4.4 10*3/uL (ref 3.9–10.3)

## 2011-08-06 LAB — KAPPA/LAMBDA LIGHT CHAINS
Kappa free light chain: 2.78 mg/dL — ABNORMAL HIGH (ref 0.33–1.94)
Lambda Free Lght Chn: 1.22 mg/dL (ref 0.57–2.63)

## 2011-08-06 LAB — COMPREHENSIVE METABOLIC PANEL
BUN: 15 mg/dL (ref 6–23)
CO2: 24 mEq/L (ref 19–32)
Glucose, Bld: 82 mg/dL (ref 70–99)
Sodium: 139 mEq/L (ref 135–145)
Total Bilirubin: 0.3 mg/dL (ref 0.3–1.2)
Total Protein: 7.1 g/dL (ref 6.0–8.3)

## 2011-08-06 LAB — SPEP & IFE WITH QIG
Albumin ELP: 57.1 % (ref 55.8–66.1)
Alpha-2-Globulin: 9 % (ref 7.1–11.8)
Beta Globulin: 7.1 % (ref 4.7–7.2)
IgG (Immunoglobin G), Serum: 835 mg/dL (ref 690–1700)
M-Spike, %: 0.4 g/dL
Total Protein, Serum Electrophoresis: 7.1 g/dL (ref 6.0–8.3)

## 2011-08-06 LAB — LACTATE DEHYDROGENASE: LDH: 145 U/L (ref 94–250)

## 2011-08-15 ENCOUNTER — Telehealth: Payer: Self-pay | Admitting: Oncology

## 2011-08-15 NOTE — Telephone Encounter (Signed)
Called pt about appt for January and April, left message

## 2011-10-07 IMAGING — CR DG CHEST 2V
1 series · 2 of 2 positions shown · non-contrast
Comparison: none

REASON FOR EXAM: severe ulcerative esophagitis
COMMENTS:

[Series 1: view not recorded · 0.17mm/px · 2 of 2 slices shown]
[im 1/2]
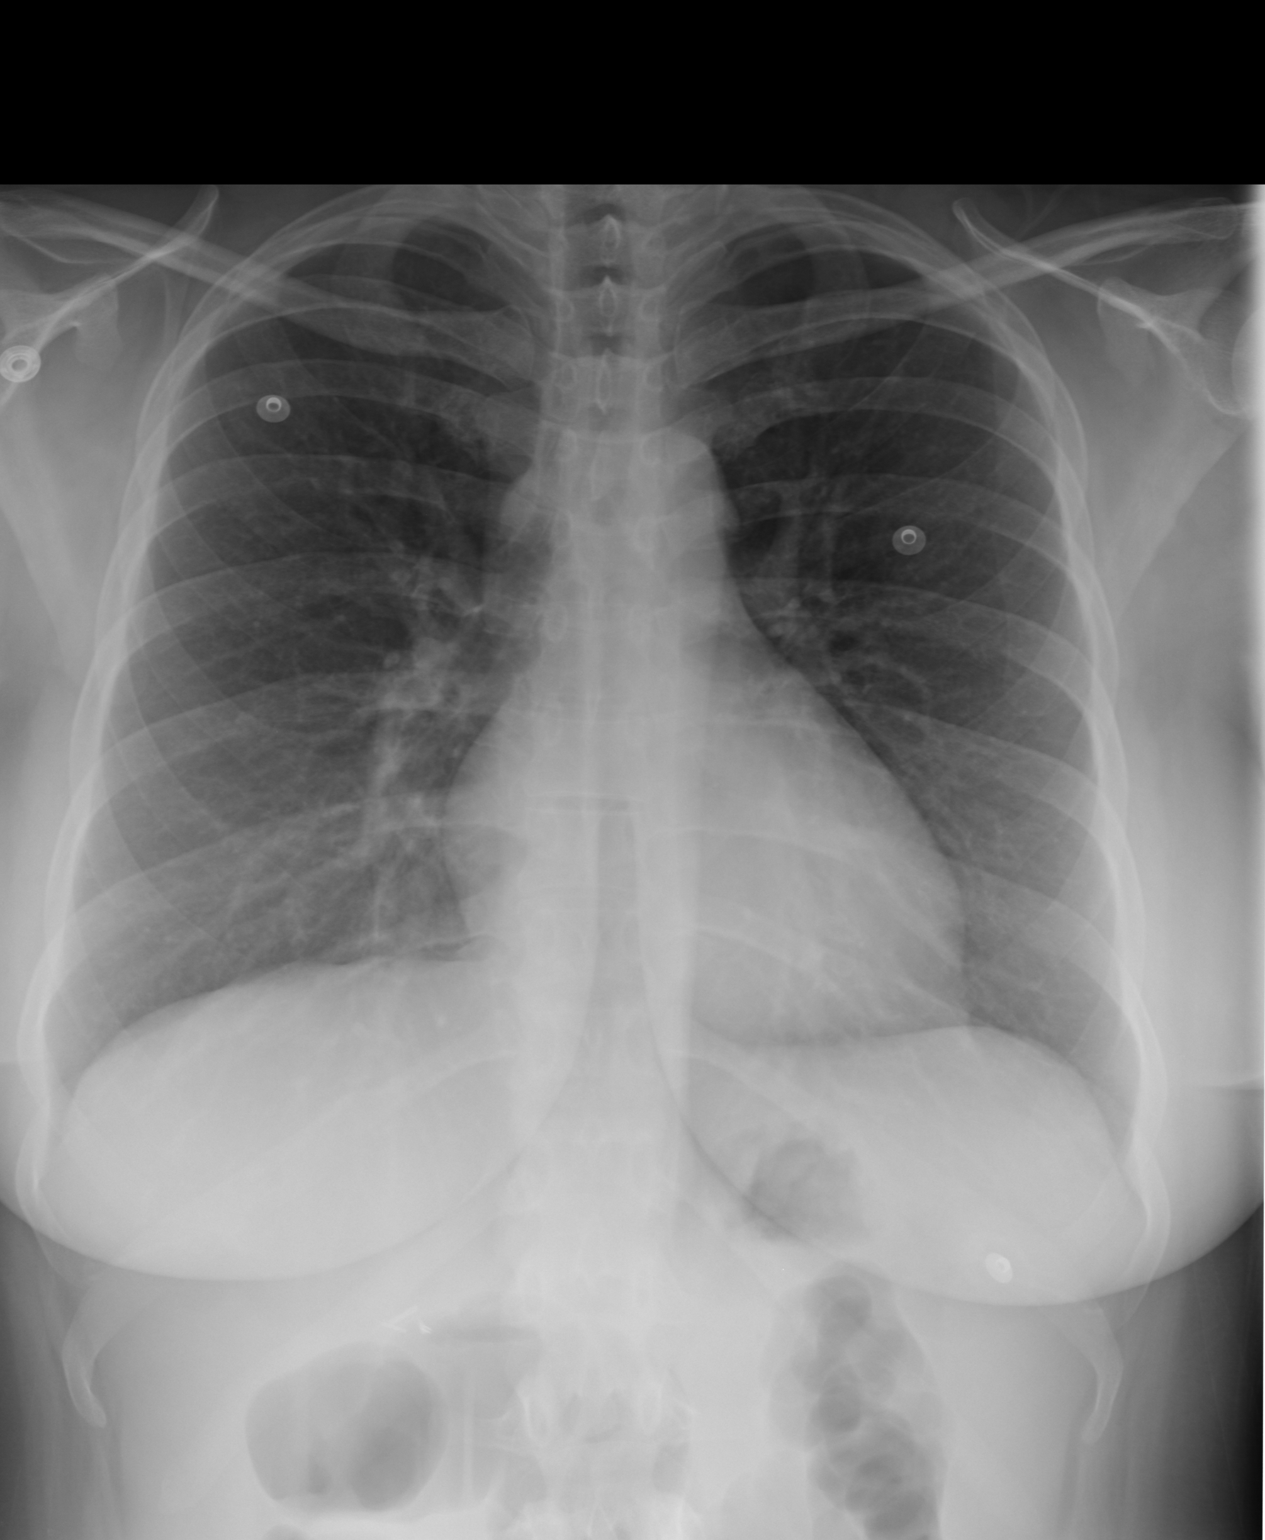
[im 2/2]
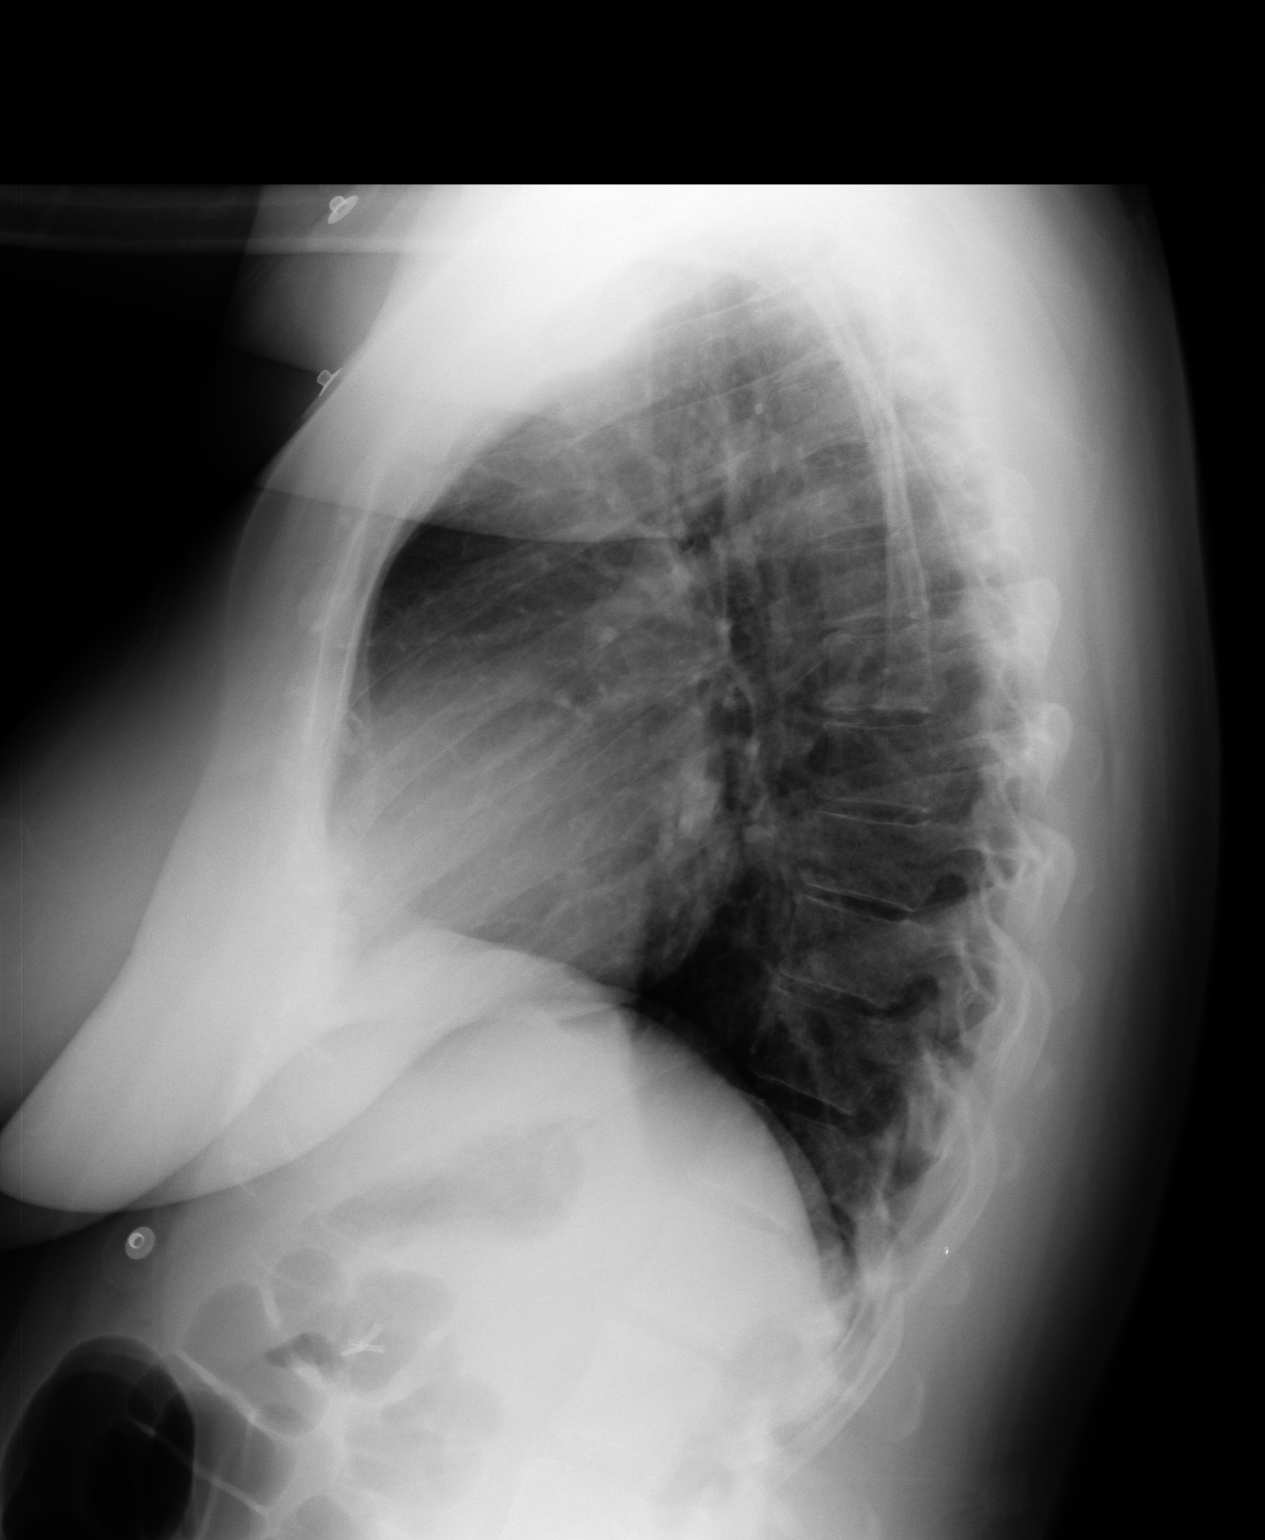

[2 of 2 positions shown; findings below may reference images not displayed]

PROCEDURE:     DXR - DXR CHEST PA (OR AP) AND LATERAL  - December 04, 2009  [DATE]

RESULT:     The lungs are adequately inflated. There is no focal infiltrate.
The heart is normal in size. The pulmonary vascularity is not engorged.
There is no evidence of a mediastinal mass or pneumomediastinum. There is no
pleural effusion.
IMPRESSION: I do not see evidence of acute cardiopulmonary abnormality.

## 2011-10-24 ENCOUNTER — Telehealth: Payer: Self-pay | Admitting: Oncology

## 2011-10-24 NOTE — Telephone Encounter (Signed)
That's fine - just find a spot for her

## 2011-10-27 ENCOUNTER — Other Ambulatory Visit: Payer: Self-pay | Admitting: Oncology

## 2011-10-27 ENCOUNTER — Other Ambulatory Visit (HOSPITAL_BASED_OUTPATIENT_CLINIC_OR_DEPARTMENT_OTHER): Payer: PRIVATE HEALTH INSURANCE | Admitting: Lab

## 2011-10-27 DIAGNOSIS — R809 Proteinuria, unspecified: Secondary | ICD-10-CM

## 2011-10-27 DIAGNOSIS — D472 Monoclonal gammopathy: Secondary | ICD-10-CM

## 2011-10-27 DIAGNOSIS — R799 Abnormal finding of blood chemistry, unspecified: Secondary | ICD-10-CM

## 2011-10-27 LAB — CBC WITH DIFFERENTIAL/PLATELET
Basophils Absolute: 0 10*3/uL (ref 0.0–0.1)
Eosinophils Absolute: 0.1 10*3/uL (ref 0.0–0.5)
HGB: 10.9 g/dL — ABNORMAL LOW (ref 11.6–15.9)
LYMPH%: 38.5 % (ref 14.0–49.7)
MCV: 82.1 fL (ref 79.5–101.0)
MONO%: 7.3 % (ref 0.0–14.0)
NEUT#: 2.3 10*3/uL (ref 1.5–6.5)
NEUT%: 50.6 % (ref 38.4–76.8)
Platelets: 141 10*3/uL — ABNORMAL LOW (ref 145–400)

## 2011-10-29 LAB — KAPPA/LAMBDA LIGHT CHAINS
Kappa free light chain: 2.94 mg/dL — ABNORMAL HIGH (ref 0.33–1.94)
Kappa:Lambda Ratio: 2.01 — ABNORMAL HIGH (ref 0.26–1.65)

## 2011-10-29 LAB — IMMUNOFIXATION ELECTROPHORESIS

## 2011-10-30 ENCOUNTER — Telehealth: Payer: Self-pay | Admitting: *Deleted

## 2011-10-30 NOTE — Telephone Encounter (Signed)
Patient called requesting how to proceed with her 24 hr urine due tomorrow morning.  She lives an hour away and there is inclement weather proposed for tonight through tomorrow.  Consulted with Lab and instructed her to keep the urine on ice and bring it in by noon.  Dynastee will try to get her parents to bring in the specimen.  If not she will start over on Sunday and bring this in on Monday.

## 2011-11-04 ENCOUNTER — Ambulatory Visit (HOSPITAL_BASED_OUTPATIENT_CLINIC_OR_DEPARTMENT_OTHER): Payer: PRIVATE HEALTH INSURANCE | Admitting: Oncology

## 2011-11-04 ENCOUNTER — Telehealth: Payer: Self-pay | Admitting: Oncology

## 2011-11-04 VITALS — BP 142/89 | HR 70 | Temp 98.6°F | Wt 193.6 lb

## 2011-11-04 DIAGNOSIS — D472 Monoclonal gammopathy: Secondary | ICD-10-CM

## 2011-11-04 DIAGNOSIS — Z23 Encounter for immunization: Secondary | ICD-10-CM

## 2011-11-04 DIAGNOSIS — D509 Iron deficiency anemia, unspecified: Secondary | ICD-10-CM

## 2011-11-04 DIAGNOSIS — R768 Other specified abnormal immunological findings in serum: Secondary | ICD-10-CM

## 2011-11-04 DIAGNOSIS — R799 Abnormal finding of blood chemistry, unspecified: Secondary | ICD-10-CM

## 2011-11-04 LAB — UIFE/LIGHT CHAINS/TP QN, 24-HR UR
Albumin, U: DETECTED
Beta, Urine: DETECTED — AB
Free Lambda Lt Chains,Ur: 0.03 mg/dL (ref 0.02–0.67)
Free Lt Chn Excr Rate: 8.58 mg/d
Gamma Globulin, Urine: DETECTED — AB
Volume, Urine: 2200 mL

## 2011-11-04 MED ORDER — INFLUENZA VIRUS VACC SPLIT PF IM SUSP
0.5000 mL | INTRAMUSCULAR | Status: AC
  Administered 2011-11-04: 0.5 mL via INTRAMUSCULAR
  Filled 2011-11-04: qty 0.5

## 2011-11-04 NOTE — Progress Notes (Signed)
Hematology and Oncology Follow Up Visit  Kara Perkins [id number] 12-20-1970 40 y.o. 11/04/2011 4:16 PM   Principle Diagnosis: Encounter Diagnosis  Name Primary?  . IgM monoclonal gammopathy of uncertain significance Yes     Interim History:   I moved her regularly scheduled appointment so we could discuss recent trends in her laboratory data. I first saw Kara Perkins when she was pregnant back in 2008 and was found to have a positive direct Coombs' test. She never demonstrated any significant hemolysis. She was followed closely through the pregnancy and blood counts remained stable. Over the years, the + Coombs test extinguished. As part of her initial evaluation serum immunoglobulins were done. She had a mild elevation of IgM. No concomitant suppression of her other immunoglobulins. I had forgotten about this until she had some GI problems back in March of 2011 when she developed an acute ulcerative esophagitis. As part of her GI evaluation immunoglobulin studies were done and she showed a persistent elevation of IgM. She was reevaluated in this office. Initial value for IgM was 425 mg percent compared with 530 mg percent done by her gastroenterologist. I have been monitoring IgM levels closely since that time. There has been a slow but steady trend for the IgM levels to be going up. Most recent value done 10/27/2011 was the highest value recorded yet at 727 mg percent. There is a monoclonal IgM with kappa light chain restriction. IgG and IgA levels remain normal. Kappa free light chains are slightly elevated at 2.94 mg percent range up to 1.94. Kappa lambda ratio slightly elevated at 2.01 (range 0.26-1.65). Total light chain has not changed very much in the last 2 years with previous values running at 2.1 mg percent and more recently up to 2.9. A single 24-hour total urine protein was elevated at 667 mg in a pattern of nonselective proteinuria on 12/30/2009 but this has not been reproducible. Subsequent  study done 07/17/2010 with total urine protein on 89 mg, on 02/06/2011 18 mg. And there is a monoclonal IgM kappa paraprotein in the urine as well as serum. In addition to the slow rise in her IgM, there has been also a trend for progressive anemia, intermittent leukopenia, and intermittent fall in her platelet count but no single platelet count has been less than 145,000. However in aggregate these changes make me suspicious that she is developing an underlying bone marrow disorder. She remains asymptomatic. She is not getting any headaches. She does get intermittent paresthesias which she does a lot of [place] carries heavy boxes. She has developed irregular menstrual cycles with heavy bleeding recently.  Medications: reviewed  Allergies: No Known Allergies  Review of Systems: Constitutional:  No constitutional symptoms  Respiratory: No dyspnea or cough except for recent seasonal bronchitis Cardiovascular:  No chest pain or palpitations Gastrointestinal: No abdominal pain or change in bowel habit Genito-Urinary: See above Musculoskeletal: No bone pain Neurologic: See above Skin: No bruises or rash Remaining ROS negative.  Physical Exam: Blood pressure 142/89, pulse 70, temperature 98.6 F (37 C), temperature source Oral, weight 193 lb 9.6 oz (87.816 kg). Wt Readings from Last 3 Encounters:  11/04/11 193 lb 9.6 oz (87.816 kg)     General appearance: Well-nourished Caucasian woman HENNT: Pharynx no erythema or exudate; no hypertrophy of her tongue; no periorbital purpura Lymph nodes: No adenopathy Breasts: Not examined Lungs: Clear to auscultation resonant to percussion Heart: Regular cardiac rhythm no murmur Abdomen: Soft nontender no mass no organomegaly Extremities: No edema no  calf tenderness Vascular: No cyanosis Neurologic: Fundi are normal optic discs sharp no retinal vein distention motor strength 5 over 5 reflexes 1+ symmetric sensation is intact to vibration sense by  tuning fork exam over the fingertips  Skin: No rash or ecchymoses  Lab Results: Lab Results  Component Value Date   WBC 4.5 10/27/2011   HGB 10.9* 10/27/2011   HCT 31.5* 10/27/2011   MCV 82.1 10/27/2011   PLT 141* 10/27/2011     Chemistry      Component Value Date/Time   NA 139 08/04/2011 1206   NA 139 08/04/2011 1206   NA 139 08/04/2011 1206   NA 139 08/04/2011 1206   NA 139 08/04/2011 1206   K 4.2 08/04/2011 1206   K 4.2 08/04/2011 1206   K 4.2 08/04/2011 1206   K 4.2 08/04/2011 1206   K 4.2 08/04/2011 1206   CL 101 08/04/2011 1206   CL 101 08/04/2011 1206   CL 101 08/04/2011 1206   CL 101 08/04/2011 1206   CL 101 08/04/2011 1206   CO2 24 08/04/2011 1206   CO2 24 08/04/2011 1206   CO2 24 08/04/2011 1206   CO2 24 08/04/2011 1206   CO2 24 08/04/2011 1206   BUN 15 08/04/2011 1206   BUN 15 08/04/2011 1206   BUN 15 08/04/2011 1206   BUN 15 08/04/2011 1206   BUN 15 08/04/2011 1206   CREATININE 0.92 08/04/2011 1206   CREATININE 0.92 08/04/2011 1206   CREATININE 0.92 08/04/2011 1206   CREATININE 0.92 08/04/2011 1206   CREATININE 0.92 08/04/2011 1206   CREATININE 0.91 02/06/2011 0856   CREATININE 0.91 02/06/2011 0856   CREATININE 0.91 02/06/2011 0856   CREATININE 0.91 02/06/2011 0856   CREATININE 0.91 02/06/2011 0856      Component Value Date/Time   CALCIUM 9.5 08/04/2011 1206   CALCIUM 9.5 08/04/2011 1206   CALCIUM 9.5 08/04/2011 1206   CALCIUM 9.5 08/04/2011 1206   CALCIUM 9.5 08/04/2011 1206   ALKPHOS 69 08/04/2011 1206   ALKPHOS 69 08/04/2011 1206   ALKPHOS 69 08/04/2011 1206   ALKPHOS 69 08/04/2011 1206   ALKPHOS 69 08/04/2011 1206   AST 18 08/04/2011 1206   AST 18 08/04/2011 1206   AST 18 08/04/2011 1206   AST 18 08/04/2011 1206   AST 18 08/04/2011 1206   ALT 15 08/04/2011 1206   ALT 15 08/04/2011 1206   ALT 15 08/04/2011 1206   ALT 15 08/04/2011 1206   ALT 15 08/04/2011 1206   BILITOT 0.3 08/04/2011 1206   BILITOT 0.3 08/04/2011 1206   BILITOT 0.3  08/04/2011 1206   BILITOT 0.3 08/04/2011 1206   BILITOT 0.3 08/04/2011 1206       Radiological Studies: CT scans chest abdomen and pelvis done 01/02/2010 showed no lymphadenopathy and no splenomegaly    Impression and Plan: #1. 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 Voice my concerns of the patient today that she may be developing an underlying hematologic disorder. Best fit clinical diagnosis would be Waldenstrm's macroglobulinemia. I do feel that a bone marrow aspiration and biopsy would be helpful at this point in time. She will check with her work schedule and get back with me in and we will schedule it here. We had a very long discussion today about the possibility of Waldenstrm's which is really a low-grade B-cell non-Hodgkin's lymphoma. Many patients remain asymptomatic for many years and can be treated with observation alone. However, we have a number of active treatments including  antibody therapy (rituximab), chemotherapy with the most active agent right now cladribine, and more recent developments showing activity with both thalidomide derivatives and m-tor inhibitors such as temsirolimus. I tried to emphasize to her that we are not in an emergency situation just trying to establish a more solid diagnosis to explain her current hematologic profile. #2. History of positive direct Coombs test without any evidence for hemolysis which has subsequently resolved on its own. However, the fact that she did have a positive Coombs test suggests that there is something not quite right with her immune system especially in view of subsequent development is with the IgM paraproteinnemia. #3. He regular menses. Current mild microcytic anemia. I recommended that she go on iron supplements.   CC:. Dr Mickey Farber, [facility], [place]; Dr Lynnae Prude, ; Dr Harold Hedge, [place]   Levert Feinstein, MD 1/22/20134:16 PM

## 2011-11-04 NOTE — Telephone Encounter (Signed)
Pt already on schedule for 4/9. gv pt schedule. No other orders.

## 2011-11-05 ENCOUNTER — Encounter: Payer: Self-pay | Admitting: Oncology

## 2011-11-05 DIAGNOSIS — D509 Iron deficiency anemia, unspecified: Secondary | ICD-10-CM

## 2011-11-05 DIAGNOSIS — D472 Monoclonal gammopathy: Secondary | ICD-10-CM

## 2011-11-05 DIAGNOSIS — R768 Other specified abnormal immunological findings in serum: Secondary | ICD-10-CM

## 2011-11-05 HISTORY — DX: Monoclonal gammopathy: D47.2

## 2011-11-05 HISTORY — DX: Iron deficiency anemia, unspecified: D50.9

## 2011-11-05 HISTORY — DX: Other specified abnormal immunological findings in serum: R76.8

## 2011-11-11 ENCOUNTER — Telehealth: Payer: Self-pay | Admitting: *Deleted

## 2011-11-11 NOTE — Telephone Encounter (Signed)
Received call from pt. Stating she had brought in a urine 10/31/11 & hadn't heard results.  She also mentioned having a BMBX & wanted to know when Dr Cyndie Chime wanted to do this.  She can be reached from 1-2pm @ [phone] cell # during her lunch or direct line at work after 2pm is [phone] but prefers her cell # 1st.  Note to Dr. Cyndie Chime.

## 2011-11-12 ENCOUNTER — Telehealth: Payer: Self-pay | Admitting: *Deleted

## 2011-11-12 NOTE — Telephone Encounter (Signed)
Notified pt that protein in urine is normal per Dr. Cyndie Chime & scheduled BMBX @ short stay /[facility] with Geraldine Contras for 12/11/11 @ 8am.  Scheduled with Carter[facility]to & pt informed to be in admitting 11/10/11 @ 6:45am to get to Short Stay  @ 7am for 8 am BX & NPO after MN & bring driver.  She expressed understanding.

## 2011-11-13 ENCOUNTER — Other Ambulatory Visit: Payer: Self-pay | Admitting: Oncology

## 2011-11-13 DIAGNOSIS — D472 Monoclonal gammopathy: Secondary | ICD-10-CM

## 2011-12-04 ENCOUNTER — Other Ambulatory Visit (HOSPITAL_COMMUNITY): Payer: PRIVATE HEALTH INSURANCE

## 2011-12-08 ENCOUNTER — Encounter (HOSPITAL_COMMUNITY): Payer: Self-pay | Admitting: Pharmacy Technician

## 2011-12-11 ENCOUNTER — Ambulatory Visit (HOSPITAL_COMMUNITY)
Admission: RE | Admit: 2011-12-11 | Discharge: 2011-12-11 | Disposition: A | Discharge status: Home / Self Care | Payer: PRIVATE HEALTH INSURANCE | Source: Ambulatory Visit | Attending: Oncology | Admitting: Oncology

## 2011-12-11 DIAGNOSIS — M25559 Pain in unspecified hip: Secondary | ICD-10-CM | POA: Insufficient documentation

## 2011-12-11 DIAGNOSIS — M79609 Pain in unspecified limb: Secondary | ICD-10-CM | POA: Insufficient documentation

## 2011-12-11 DIAGNOSIS — D472 Monoclonal gammopathy: Secondary | ICD-10-CM | POA: Insufficient documentation

## 2011-12-11 LAB — CBC
Hemoglobin: 10.2 g/dL — ABNORMAL LOW (ref 12.0–15.0)
MCH: 26.6 pg (ref 26.0–34.0)
MCHC: 32.8 g/dL (ref 30.0–36.0)
Platelets: 142 10*3/uL — ABNORMAL LOW (ref 150–400)
RDW: 14.9 % (ref 11.5–15.5)

## 2011-12-11 LAB — DIFFERENTIAL
Basophils Absolute: 0 10*3/uL (ref 0.0–0.1)
Basophils Relative: 0 % (ref 0–1)
Eosinophils Absolute: 0.1 10*3/uL (ref 0.0–0.7)
Monocytes Relative: 6 % (ref 3–12)
Neutro Abs: 2.4 10*3/uL (ref 1.7–7.7)
Neutrophils Relative %: 57 % (ref 43–77)

## 2011-12-11 MED ORDER — MIDAZOLAM HCL 5 MG/5ML IJ SOLN
INTRAMUSCULAR | Status: AC | PRN
  Administered 2011-12-11: 1 mg via INTRAVENOUS
  Administered 2011-12-11: 1.5 mg via INTRAVENOUS
  Administered 2011-12-11 (×3): 1 mg via INTRAVENOUS

## 2011-12-11 MED ORDER — SODIUM CHLORIDE 0.9 % IV SOLN
Freq: Once | INTRAVENOUS | Status: AC
  Administered 2011-12-11: 08:00:00 via INTRAVENOUS

## 2011-12-11 MED ORDER — MIDAZOLAM HCL 10 MG/2ML IJ SOLN
INTRAMUSCULAR | Status: AC
  Filled 2011-12-11: qty 2

## 2011-12-11 NOTE — ED Notes (Addendum)
Patient is resting comfortably. Placed supine with dressing to left posterior iliac area CDI

## 2011-12-11 NOTE — ED Notes (Signed)
Family updated as to patient's status.

## 2011-12-11 NOTE — Sedation Documentation (Signed)
Medication dose calculated and verified for: Versed 5.5mg 

## 2011-12-11 NOTE — ED Notes (Signed)
Patient denies pain and is resting comfortably.  

## 2011-12-11 NOTE — ED Notes (Signed)
Ambulated in room and up to BR with minimal assist and tolerated this well . No further c/o pain in leg

## 2011-12-11 NOTE — Progress Notes (Signed)
In the last two weeks has noticed a left hip pain (ball and joint area) that radiates to left leg. None at this time but states she notices that it was most noticeable when she is on her feet a lot at work

## 2011-12-11 NOTE — ED Notes (Signed)
Q 5 minute VS documented in doc flow sheets

## 2011-12-11 NOTE — ED Notes (Signed)
HOB 45 degrees c/o mild left leg pain while lying in bed

## 2011-12-11 NOTE — Procedures (Signed)
Bone Marrow Biopsy and Aspiration Procedure Note   Informed consent was obtained and potential risks including bleeding, infection and pain were reviewed with the patient.  Posterior iliac crest(s) prepped with Betadine.  Lidocaine 2% local anesthesia infiltrated into the subcutaneous tissue. Premedication: versed 5.5 mg IV  Left bone marrow biopsy x 2 and  aspirate was obtained.   The procedure was tolerated well and there were no complications.  Specimens sent for: routine histopathologic stains and sectioning, flow cytometry and cytogenetics  Physician: Levert Feinstein

## 2011-12-16 ENCOUNTER — Telehealth: Payer: Self-pay | Admitting: *Deleted

## 2011-12-16 NOTE — Telephone Encounter (Signed)
Received vm call from pt stating that she received a call from Dr.Granfortuna that BMBX normal but phone broke up & she needs to know when to return.  She also reports weakness & dull ache in her low back & would like to know when she will have a full recovery from the BX.   Returned call @ 4:55pm & asked her to call back with some more detail, ? Numbness or tingling down legs, any redness, swelling, etc.

## 2012-01-20 ENCOUNTER — Ambulatory Visit (HOSPITAL_BASED_OUTPATIENT_CLINIC_OR_DEPARTMENT_OTHER): Payer: PRIVATE HEALTH INSURANCE | Admitting: Oncology

## 2012-01-20 ENCOUNTER — Other Ambulatory Visit (HOSPITAL_BASED_OUTPATIENT_CLINIC_OR_DEPARTMENT_OTHER): Payer: PRIVATE HEALTH INSURANCE | Admitting: Lab

## 2012-01-20 ENCOUNTER — Telehealth: Payer: Self-pay | Admitting: Oncology

## 2012-01-20 VITALS — BP 126/80 | HR 88 | Temp 98.3°F | Ht 67.0 in | Wt 193.5 lb

## 2012-01-20 DIAGNOSIS — D472 Monoclonal gammopathy: Secondary | ICD-10-CM

## 2012-01-20 DIAGNOSIS — R809 Proteinuria, unspecified: Secondary | ICD-10-CM

## 2012-01-20 DIAGNOSIS — R799 Abnormal finding of blood chemistry, unspecified: Secondary | ICD-10-CM

## 2012-01-20 LAB — COMPREHENSIVE METABOLIC PANEL
ALT: 25 U/L (ref 0–35)
Albumin: 3.9 g/dL (ref 3.5–5.2)
Alkaline Phosphatase: 79 U/L (ref 39–117)
CO2: 31 mEq/L (ref 19–32)
Potassium: 3.4 mEq/L — ABNORMAL LOW (ref 3.5–5.3)
Sodium: 138 mEq/L (ref 135–145)
Total Bilirubin: 0.3 mg/dL (ref 0.3–1.2)
Total Protein: 7.6 g/dL (ref 6.0–8.3)

## 2012-01-20 LAB — CBC WITH DIFFERENTIAL/PLATELET
EOS%: 2.3 % (ref 0.0–7.0)
Eosinophils Absolute: 0.1 10*3/uL (ref 0.0–0.5)
HGB: 11.2 g/dL — ABNORMAL LOW (ref 11.6–15.9)
MCV: 82.3 fL (ref 79.5–101.0)
MONO%: 5.2 % (ref 0.0–14.0)
NEUT#: 2.6 10*3/uL (ref 1.5–6.5)
RBC: 4.05 10*6/uL (ref 3.70–5.45)
RDW: 16.2 % — ABNORMAL HIGH (ref 11.2–14.5)
lymph#: 1.6 10*3/uL (ref 0.9–3.3)
nRBC: 0 % (ref 0–0)

## 2012-01-20 NOTE — Progress Notes (Signed)
Hematology and Oncology Follow Up Visit  Kara Perkins [id number] 1971/04/19 40 y.o. 01/20/2012 5:14 PM   Principle Diagnosis: Encounter Diagnosis  Name Primary?  Marland Kitchen MGUS (monoclonal gammopathy of unknown significance) Yes     Interim History:   Short interim followup visit for this 41 year old woman initially referred to our office in 2008 for evaluation of a positive direct Coombs' test. She never had any evidence for overt hemolysis. She was found to have a mild elevation of IgM immunoglobulin. Values have been fairly constant over time with a previous peak value of 681 mg percent in September 2008 with subsequent values in the 425-580 mg percent range through August of 2012. However most recent to values showed a trend for further elevation with 699 mg percent on 08/04/2011 and 727 mg percent on January 14th 2013. She has no lymphadenopathy or splenomegaly on exam or on a CT scan of the chest abdomen and pelvis done on 01/02/2010. However in view of the rising IgM level and a trend for her platelet count to decrease with value as low as 141,000, I did a bone marrow aspiration and biopsy on 12/11/2011. I was pleasantly surprised to see that there was no infiltrate of lymphocytes or plasma cells. The bone marrow aspirate was dilute but the biopsy specimen was good. Touch preparations were made. 500 cell count done off of the touch prep showed only 1% plasma cells and 4% lymphocytes. For now we can call this monoclonal gammopathy of undetermined significance.  Since her last visit here she got bit by a tick. She started to develop a rash and low-grade fever. She went to an urgent care facility and was put on a 10 day course of doxycycline. She has noticed some achiness in her ankles and knees which was present to a lesser degree prior to the tick bite. She noticed a transient area of inflammation and tenderness in the medial proximal left thigh which resolved on its own.    Medications:  reviewed  Allergies: No Known Allergies  Review of Systems: Constitutional: No constitutional symptoms   Respiratory: No cough or dyspnea Cardiovascular:  No chest pain or palpitations Gastrointestinal: No change in bowel habit Genito-Urinary: Not questioned Musculoskeletal: See above Neurologic: No new neurologic symptoms Skin: See above Remaining ROS negative.  Physical Exam: Blood pressure 126/80, pulse 88, temperature 98.3 F (36.8 C), temperature source Oral, height 5\' 7"  (1.702 m), weight 193 lb 8 oz (87.771 kg). Wt Readings from Last 3 Encounters:  01/20/12 193 lb 8 oz (87.771 kg)  12/11/11 190 lb (86.183 kg)  11/04/11 193 lb 9.6 oz (87.816 kg)     General appearance: Well-nourished Caucasian woman HENNT: Pharynx no erythema or exudate Lymph nodes: No cervical supraclavicular or axillary adenopathy Breasts: Not examined Lungs: Clear to auscultation resonant to percussion Heart: Regular rhythm no murmur  Abdomen: Soft nontender no mass no organomegaly Extremities: No edema no calf tenderness no joint tenderness Vascular: No cyanosis Neurologic: Motor strength 5 over 5 reflexes absent but symmetric, sensation intact to vibration over the fingertips by tuning fork exam Skin: No rash or ecchymosis  Lab Results: Lab Results  Component Value Date   WBC 4.5 01/20/2012   HGB 11.2* 01/20/2012   HCT 33.4* 01/20/2012   MCV 82.3 01/20/2012   PLT 153 01/20/2012     Chemistry      Component Value Date/Time   NA 138 01/20/2012 1452   K 3.4* 01/20/2012 1452   CL 100 01/20/2012 1452  CO2 31 01/20/2012 1452   BUN 13 01/20/2012 1452   CREATININE 0.99 01/20/2012 1452   CREATININE 0.91 02/06/2011 0856   CREATININE 0.91 02/06/2011 0856   CREATININE 0.91 02/06/2011 0856   CREATININE 0.91 02/06/2011 0856   CREATININE 0.91 02/06/2011 0856      Component Value Date/Time   CALCIUM 9.3 01/20/2012 1452   ALKPHOS 79 01/20/2012 1452   AST 24 01/20/2012 1452   ALT 25 01/20/2012 1452   BILITOT 0.3 01/20/2012 1452        Impression and Plan: #1. IgM monoclonal gammopathy of undetermined significance. I will continue to monitor IgM levels periodically over time. I am reassured with the recent normal bone marrow that we are not dealing with Waldenstrm's macroglobulinemia at this time. No evidence for IgM multiple myeloma.  #2. History of a positive direct Coombs' test. I believe she has some disturbance of her anion system to explain this finding as well as the elevated IgM. Although she attributes her large joint arthralgias to a recent tick bite, I am more inclined to believe that she may be developing an atypical collagen vascular disorder or time.   CC:. Dr Mickey Farber; Dr Lynnae Prude; Dr Harold Hedge   Levert Feinstein, MD 4/9/20135:14 PM

## 2012-01-20 NOTE — Telephone Encounter (Signed)
gve the pt her oct,april 2014 appt calendar

## 2012-01-23 LAB — IMMUNOFIXATION ELECTROPHORESIS
IgA: 263 mg/dL (ref 69–380)
IgG (Immunoglobin G), Serum: 861 mg/dL (ref 690–1700)
IgM, Serum: 837 mg/dL — ABNORMAL HIGH (ref 52–322)
Total Protein, Serum Electrophoresis: 7.4 g/dL (ref 6.0–8.3)

## 2012-03-30 ENCOUNTER — Ambulatory Visit: Payer: Self-pay | Admitting: Family Medicine

## 2012-06-29 ENCOUNTER — Telehealth: Payer: Self-pay | Admitting: Oncology

## 2012-06-29 NOTE — Telephone Encounter (Signed)
Pt called today to change time of 10/8 appt. Also confirmed April 2014 appts.

## 2012-07-19 ENCOUNTER — Telehealth: Payer: Self-pay | Admitting: *Deleted

## 2012-07-19 ENCOUNTER — Other Ambulatory Visit (HOSPITAL_COMMUNITY): Payer: Self-pay | Admitting: Oncology

## 2012-07-19 DIAGNOSIS — D509 Iron deficiency anemia, unspecified: Secondary | ICD-10-CM

## 2012-07-19 NOTE — Telephone Encounter (Signed)
Patient called to say she would like her hemoglobin tested.  Her last was 9.9 with OB-GYN.  Called her back and left message that a CBC was already on her list of lab work to be done tomorrow so it is no problem.

## 2012-07-20 ENCOUNTER — Other Ambulatory Visit (HOSPITAL_BASED_OUTPATIENT_CLINIC_OR_DEPARTMENT_OTHER): Payer: PRIVATE HEALTH INSURANCE | Admitting: Lab

## 2012-07-20 DIAGNOSIS — D472 Monoclonal gammopathy: Secondary | ICD-10-CM

## 2012-07-20 DIAGNOSIS — D509 Iron deficiency anemia, unspecified: Secondary | ICD-10-CM

## 2012-07-20 LAB — CBC WITH DIFFERENTIAL/PLATELET
BASO%: 0.2 % (ref 0.0–2.0)
EOS%: 1.9 % (ref 0.0–7.0)
MCH: 27.4 pg (ref 25.1–34.0)
MCHC: 33.7 g/dL (ref 31.5–36.0)
NEUT%: 53.6 % (ref 38.4–76.8)
RBC: 3.89 10*6/uL (ref 3.70–5.45)
RDW: 15.9 % — ABNORMAL HIGH (ref 11.2–14.5)
lymph#: 1.7 10*3/uL (ref 0.9–3.3)

## 2012-07-21 LAB — IGM: IgM, Serum: 741 mg/dL — ABNORMAL HIGH (ref 52–322)

## 2012-07-21 LAB — IRON AND TIBC
Iron: 31 ug/dL — ABNORMAL LOW (ref 42–145)
UIBC: 334 ug/dL (ref 125–400)

## 2012-07-23 ENCOUNTER — Telehealth: Payer: Self-pay | Admitting: *Deleted

## 2012-07-23 MED ORDER — POLYSACCHARIDE IRON COMPLEX 150 MG PO CAPS: 150.0000 mg | ORAL_CAPSULE | Freq: Two times a day (BID) | ORAL | Status: DC

## 2012-07-23 NOTE — Telephone Encounter (Signed)
Called and spoke with pt; per Dr. Cyndie Chime IgM level stable and has decreased; anemia now due to iron deficiency and wants to start pt on Niferex 150mg  twice a day.  Pt verbalized understanding and requested HgB results which were 10.7.  Pt expressed appreciation for call.

## 2012-12-01 ENCOUNTER — Ambulatory Visit
Admission: RE | Admit: 2012-12-01 | Discharge: 2012-12-01 | Disposition: A | Discharge status: Home / Self Care | Payer: Commercial Managed Care - PPO | Source: Ambulatory Visit | Attending: Obstetrics and Gynecology | Admitting: Obstetrics and Gynecology

## 2012-12-01 ENCOUNTER — Other Ambulatory Visit: Payer: Self-pay | Admitting: Obstetrics and Gynecology

## 2012-12-01 DIAGNOSIS — N644 Mastodynia: Secondary | ICD-10-CM

## 2013-01-18 ENCOUNTER — Other Ambulatory Visit: Payer: PRIVATE HEALTH INSURANCE | Admitting: Lab

## 2013-01-19 ENCOUNTER — Other Ambulatory Visit (HOSPITAL_BASED_OUTPATIENT_CLINIC_OR_DEPARTMENT_OTHER): Payer: Commercial Managed Care - PPO | Admitting: Lab

## 2013-01-19 DIAGNOSIS — D472 Monoclonal gammopathy: Secondary | ICD-10-CM

## 2013-01-19 LAB — CBC WITH DIFFERENTIAL/PLATELET
BASO%: 0.2 % (ref 0.0–2.0)
HCT: 31.2 % — ABNORMAL LOW (ref 34.8–46.6)
LYMPH%: 43.3 % (ref 14.0–49.7)
MCHC: 32.1 g/dL (ref 31.5–36.0)
MONO#: 0.2 10*3/uL (ref 0.1–0.9)
NEUT%: 49.9 % (ref 38.4–76.8)
Platelets: 141 10*3/uL — ABNORMAL LOW (ref 145–400)
WBC: 4.9 10*3/uL (ref 3.9–10.3)

## 2013-01-21 ENCOUNTER — Telehealth: Payer: Self-pay | Admitting: *Deleted

## 2013-01-21 NOTE — Telephone Encounter (Signed)
Message left yest for return call for results & she called back today.  Call returned & lab results given per Dr Cyndie Chime.

## 2013-01-21 NOTE — Telephone Encounter (Signed)
Message copied by Sabino Snipes on Fri Jan 21, 2013 12:49 PM ------      Message from: Levert Feinstein      Created: Thu Jan 20, 2013 12:49 PM       Call pt IgM 796, Hb 10 - both within range of previous values ------

## 2013-01-25 ENCOUNTER — Ambulatory Visit (HOSPITAL_BASED_OUTPATIENT_CLINIC_OR_DEPARTMENT_OTHER): Payer: Commercial Managed Care - PPO | Admitting: Oncology

## 2013-01-25 ENCOUNTER — Telehealth: Payer: Self-pay | Admitting: Oncology

## 2013-01-25 ENCOUNTER — Other Ambulatory Visit: Payer: PRIVATE HEALTH INSURANCE | Admitting: Lab

## 2013-01-25 VITALS — BP 147/91 | HR 77 | Temp 97.9°F | Resp 18 | Ht 67.0 in | Wt 200.5 lb

## 2013-01-25 DIAGNOSIS — R799 Abnormal finding of blood chemistry, unspecified: Secondary | ICD-10-CM

## 2013-01-25 DIAGNOSIS — D472 Monoclonal gammopathy: Secondary | ICD-10-CM

## 2013-01-25 DIAGNOSIS — N920 Excessive and frequent menstruation with regular cycle: Secondary | ICD-10-CM

## 2013-01-25 DIAGNOSIS — D509 Iron deficiency anemia, unspecified: Secondary | ICD-10-CM

## 2013-01-25 DIAGNOSIS — R768 Other specified abnormal immunological findings in serum: Secondary | ICD-10-CM

## 2013-01-25 NOTE — Telephone Encounter (Signed)
gv and printed appt sched and avs for pt  °

## 2013-01-25 NOTE — Progress Notes (Signed)
Hematology and Oncology Follow Up Visit  Kara Perkins [id number] [birth year] 42 y.o. 01/25/2013 11:41 AM   Principle Diagnosis: Encounter Diagnoses  Name Primary?  . Positive Coombs test   . Iron deficiency anemia   . IgM monoclonal gammopathy of uncertain significance Yes     Interim History:    Follow up visit for this 42 year old woman I first saw  when she was pregnant back in 2008 and was found to have a positive direct Coombs' test. She never demonstrated any significant hemolysis. She was followed closely through the pregnancy and blood counts remained stable. Over the years, the + Coombs test yeah extinguished. As part of her initial evaluation serum immunoglobulins were done. She had a mild elevation of IgM. No concomitant suppression of her other immunoglobulins. I had forgotten about this until she had some GI problems back in March of 2011 when she developed an acute ulcerative esophagitis. As part of her GI evaluation immunoglobulin studies were done and she showed a persistent elevation of IgM. She was reevaluated in this office. Initial value for IgM was 425 mg percent compared with 530 mg percent done by her gastroenterologist. I have been monitoring IgM levels closely since that time. There has been a slow but steady trend for the IgM levels to be going up. A single 24-hour total urine protein was elevated at 667 mg in a pattern of nonselective proteinuria on 12/30/2009 but this has not been reproducible. Subsequent study done 07/17/2010 with total urine protein on 89 mg, on 02/06/2011 18 mg. There is a monoclonal IgM kappa paraprotein in the urine as well as serum. In addition to the slow rise in her IgM, there has been also a trend for progressive anemia, intermittent leukopenia, and intermittent fall in her platelet count but no single platelet count has been less than 124,000. Interpreting her hemoglobin has been complicated by the fact that she is chronically iron deficient  from menorrhagia. I did a bone marrow aspiration and biopsy on 12/11/2011. There was no evidence for either Waldenstrm's or multiple myeloma on that specimen.  She's had no interim medical problems. She has been discussing possibility of endometrial ablation procedure with her gynecologist. She has not tolerated oral iron. She discontinued it and this is reflected in the fall in her hemoglobin down to 10 g today.   Medications: reviewed  Allergies: No Known Allergies  Review of Systems: Constitutional:   No constitutional symptoms Respiratory: No cough or dyspnea Cardiovascular:  No chest pain or palpitations Gastrointestinal: Occasional intestinal spasm no diarrhea no hematochezia Genito-Urinary: See above Musculoskeletal: No muscle bone or joint pains Neurologic: No headache or change in vision Skin: No rash or ecchymosis Remaining ROS negative.  Physical Exam: Blood pressure 147/91, pulse 77, temperature 97.9 F (36.6 C), temperature source Oral, resp. rate 18, height 5\' 7"  (1.702 m), weight 200 lb 8 oz (90.946 kg). Wt Readings from Last 3 Encounters:  01/25/13 200 lb 8 oz (90.946 kg)  01/20/12 193 lb 8 oz (87.771 kg)  12/11/11 190 lb (86.183 kg)     General appearance: Well-nourished Caucasian woman HENNT: Pharynx no erythema or exudate Lymph nodes: No adenopathy Breasts: Lungs: Clear to auscultation resonant to percussion Heart: Regular rhythm no murmur Abdomen: Soft, nontender, no mass, no organomegaly Extremities: No edema, no calf tenderness Vascular: Neurologic: No focal deficit Skin: No rash or ecchymosis  Lab Results: Lab Results  Component Value Date   WBC 4.9 01/19/2013   HGB 10.0* 01/19/2013   HCT  31.2* 01/19/2013   MCV 79.2* 01/19/2013   PLT 141* 01/19/2013     Chemistry      Component Value Date/Time   NA 138 01/20/2012 1452   K 3.4* 01/20/2012 1452   CL 100 01/20/2012 1452   CO2 31 01/20/2012 1452   BUN 13 01/20/2012 1452   CREATININE 0.99 01/20/2012 1452    CREATININE 0.91 02/06/2011 0856      Component Value Date/Time   CALCIUM 9.3 01/20/2012 1452   ALKPHOS 79 01/20/2012 1452   AST 24 01/20/2012 1452   ALT 25 01/20/2012 1452   BILITOT 0.3 01/20/2012 1452     IgM: 796 mg percent done 01/19/2013 compared with 741 on 07/20/2012, 837 on 01/20/2012, 727 on 10/27/2011, and 699 on 08/04/2011 Radiological Studies:   Impression and Plan: #1. Positive direct Coombs test. No longer detectable on most recent analyses done in April 2009, March 2011, and October 2011. She never had any evidence for active hemolysis even when the test was positive.  #2. IgM monoclonal gammopathy of undetermined significance with nondiagnostic bone marrow for either lymphoma or myeloma. Of note her paternal grandfather and a paternal uncle had rheumatoid arthritis. This condition is usually caused by an IgM antibody against IgG. Mrs. Anastacio does not have any joint symptoms and I questioned her about this again today. Plan: Continue every 6 month monitoring  #3. Iron deficiency anemia due to menorrhagia. I'm giving her a prescription for Niferex ferrous gluconate preparation. Hopefully she will tolerate this better than the ferrous sulfate and be compliant. She is considering endometrial ablation procedure.   CC:. Dr. Thressa Sheller; Dr. Mickey Farber; Dr. Andrez Grime   Levert Feinstein, MD 4/15/201411:41 AM

## 2013-01-27 ENCOUNTER — Other Ambulatory Visit: Payer: Self-pay

## 2013-01-27 DIAGNOSIS — D509 Iron deficiency anemia, unspecified: Secondary | ICD-10-CM

## 2013-01-27 MED ORDER — POLYSACCHARIDE IRON COMPLEX 150 MG PO CAPS: 150.0000 mg | ORAL_CAPSULE | Freq: Two times a day (BID) | ORAL | Status: DC

## 2013-07-18 ENCOUNTER — Other Ambulatory Visit (HOSPITAL_BASED_OUTPATIENT_CLINIC_OR_DEPARTMENT_OTHER): Payer: Commercial Managed Care - PPO | Admitting: Lab

## 2013-07-18 DIAGNOSIS — D509 Iron deficiency anemia, unspecified: Secondary | ICD-10-CM

## 2013-07-18 DIAGNOSIS — R768 Other specified abnormal immunological findings in serum: Secondary | ICD-10-CM

## 2013-07-18 DIAGNOSIS — R799 Abnormal finding of blood chemistry, unspecified: Secondary | ICD-10-CM

## 2013-07-18 DIAGNOSIS — N920 Excessive and frequent menstruation with regular cycle: Secondary | ICD-10-CM

## 2013-07-18 DIAGNOSIS — D472 Monoclonal gammopathy: Secondary | ICD-10-CM

## 2013-07-18 DIAGNOSIS — R7689 Other specified abnormal immunological findings in serum: Secondary | ICD-10-CM

## 2013-07-18 LAB — CBC & DIFF AND RETIC
Basophils Absolute: 0 10*3/uL (ref 0.0–0.1)
EOS%: 2.4 % (ref 0.0–7.0)
Eosinophils Absolute: 0.1 10*3/uL (ref 0.0–0.5)
HGB: 8.7 g/dL — ABNORMAL LOW (ref 11.6–15.9)
LYMPH%: 40.3 % (ref 14.0–49.7)
MCH: 23.3 pg — ABNORMAL LOW (ref 25.1–34.0)
MCHC: 32 g/dL (ref 31.5–36.0)
MCV: 73 fL — ABNORMAL LOW (ref 79.5–101.0)
MONO%: 6.1 % (ref 0.0–14.0)
NEUT#: 2.4 10*3/uL (ref 1.5–6.5)
NEUT%: 50.9 % (ref 38.4–76.8)
Platelets: 141 10*3/uL — ABNORMAL LOW (ref 145–400)
RBC: 3.72 10*6/uL (ref 3.70–5.45)
RDW: 18.6 % — ABNORMAL HIGH (ref 11.2–14.5)
Retic Ct Abs: 176.7 10*3/uL — ABNORMAL HIGH (ref 33.70–90.70)

## 2013-07-18 LAB — IRON AND TIBC CHCC
%SAT: 7 % — ABNORMAL LOW (ref 21–57)
Iron: 27 ug/dL — ABNORMAL LOW (ref 41–142)
TIBC: 390 ug/dL (ref 236–444)
UIBC: 363 ug/dL (ref 120–384)

## 2013-07-18 LAB — COMPREHENSIVE METABOLIC PANEL (CC13)
ALT: 15 U/L (ref 0–55)
AST: 16 U/L (ref 5–34)
Albumin: 3.6 g/dL (ref 3.5–5.0)
Alkaline Phosphatase: 66 U/L (ref 40–150)
Potassium: 4.5 mEq/L (ref 3.5–5.1)
Sodium: 139 mEq/L (ref 136–145)
Total Bilirubin: 0.47 mg/dL (ref 0.20–1.20)
Total Protein: 7.4 g/dL (ref 6.4–8.3)

## 2013-07-20 ENCOUNTER — Other Ambulatory Visit: Payer: Self-pay | Admitting: Oncology

## 2013-07-20 DIAGNOSIS — R768 Other specified abnormal immunological findings in serum: Secondary | ICD-10-CM

## 2013-07-20 LAB — LACTATE DEHYDROGENASE (CC13): LDH: 205 U/L (ref 125–245)

## 2013-07-20 LAB — IMMUNOFIXATION ELECTROPHORESIS
IgG (Immunoglobin G), Serum: 706 mg/dL (ref 690–1700)
IgM, Serum: 813 mg/dL — ABNORMAL HIGH (ref 52–322)
Total Protein, Serum Electrophoresis: 7.2 g/dL (ref 6.0–8.3)

## 2013-07-20 LAB — KAPPA/LAMBDA LIGHT CHAINS: Kappa:Lambda Ratio: 2.01 — ABNORMAL HIGH (ref 0.26–1.65)

## 2013-07-21 ENCOUNTER — Other Ambulatory Visit: Payer: Self-pay | Admitting: *Deleted

## 2013-07-21 ENCOUNTER — Telehealth: Payer: Self-pay | Admitting: *Deleted

## 2013-07-21 DIAGNOSIS — D509 Iron deficiency anemia, unspecified: Secondary | ICD-10-CM

## 2013-07-21 NOTE — Telephone Encounter (Signed)
Per Dr. Cyndie Chime, notified pt of lab results and MD instructions to go back on iron (Niferex 150 mg) take twice a day.  Pt verbalized understanding and confirmed appt for 07/26/13 at 11:30.

## 2013-07-21 NOTE — Telephone Encounter (Signed)
Message copied by Gala Romney on Thu Jul 21, 2013 11:22 AM ------      Message from: Levert Feinstein      Created: Wed Jul 20, 2013  1:00 PM       Call pt - needs to go back on iron - Niferex 150 mg PO BID ------

## 2013-07-26 ENCOUNTER — Ambulatory Visit (HOSPITAL_BASED_OUTPATIENT_CLINIC_OR_DEPARTMENT_OTHER): Payer: Commercial Managed Care - PPO | Admitting: Oncology

## 2013-07-26 ENCOUNTER — Encounter: Payer: Self-pay | Admitting: Oncology

## 2013-07-26 ENCOUNTER — Telehealth: Payer: Self-pay | Admitting: Oncology

## 2013-07-26 VITALS — BP 130/81 | HR 81 | Temp 97.3°F | Resp 20 | Ht 67.0 in | Wt 191.3 lb

## 2013-07-26 DIAGNOSIS — N920 Excessive and frequent menstruation with regular cycle: Secondary | ICD-10-CM

## 2013-07-26 DIAGNOSIS — D509 Iron deficiency anemia, unspecified: Secondary | ICD-10-CM

## 2013-07-26 DIAGNOSIS — D472 Monoclonal gammopathy: Secondary | ICD-10-CM

## 2013-07-26 DIAGNOSIS — D500 Iron deficiency anemia secondary to blood loss (chronic): Secondary | ICD-10-CM

## 2013-07-26 DIAGNOSIS — N924 Excessive bleeding in the premenopausal period: Secondary | ICD-10-CM

## 2013-07-26 DIAGNOSIS — R768 Other specified abnormal immunological findings in serum: Secondary | ICD-10-CM

## 2013-07-26 HISTORY — DX: Excessive bleeding in the premenopausal period: N92.4

## 2013-07-26 NOTE — Progress Notes (Signed)
Hematology and Oncology Follow Up Visit  Kara Perkins [id number] 11-10-1970 42 y.o. 07/26/2013 5:40 PM   Principle Diagnosis: Encounter Diagnoses  Name Primary?  . IgM monoclonal gammopathy of uncertain significance Yes  . Positive Coombs test   . Iron deficiency anemia      Interim History:    Clinical summary: Follow up visit for this 42 year old woman I first saw when she was pregnant back in 2008 and was found to have a positive direct Coombs' test. She never demonstrated any significant hemolysis. She was followed closely through the pregnancy and blood counts remained stable. Over the years, the positive Coombs test  extinguished. As part of her initial evaluation, serum immunoglobulins were done. She had a mild elevation of IgM. No concomitant suppression of her other immunoglobulins. I had forgotten about this until she had some GI problems back in March of 2011 when she developed  acute ulcerative esophagitis. As part of her GI evaluation immunoglobulin studies were done and she showed a persistent elevation of IgM. She was reevaluated in this office. Initial value for IgM was 425 mg percent compared with 530 mg percent done by her gastroenterologist. I have been monitoring IgM levels closely since that time. There has been a slow but steady trend for the IgM levels to be going up. A single 24-hour total urine protein was elevated at 667 mg in a pattern of nonselective proteinuria on 12/30/2009 but this has not been reproducible. Subsequent study done 07/17/2010 with total urine protein on 89 mg, on 02/06/2011 18 mg. There is a monoclonal IgM kappa paraprotein in the urine as well as serum. In addition to the slow rise in her IgM, there has  Also been a trend for progressive anemia, intermittent leukopenia, and intermittent fall in her platelet count but no single platelet count has been less than 124,000. Interpreting her hemoglobin has been complicated by the fact that she is  chronically iron deficient from menorrhagia.  I did a bone marrow aspiration and biopsy on 12/11/2011. There was no evidence for either Waldenstrm's or multiple myeloma on that specimen.   She's had no interim medical problems. She continues to have menorrhagia. She is still discussing possibility of endometrial ablation procedure with her gynecologist. She did not tolerate her previous oral iron preparation. I started her on Niferex ferrous gluconate at time of her visit here in April. She did tolerate this but stopped taking it. This is reflected in her hemoglobin done last week which was down to 8.7 compare with 10 g in April.  IgM paraprotein has been overall stable for the last year with values ranging between a low of 699 mg percent and a high of 837 mg percent with most recent value of 813 on 07/18/2013.   Medications: reviewed  Allergies: No Known Allergies  Review of Systems: Constitutional:   No anorexia, weight loss, fever HEENT: No sore throat Respiratory: No cough or dyspnea Cardiovascular:  No chest pain or palpitations Gastrointestinal: No abdominal pain no change in bowel habit Genito-Urinary: See above Musculoskeletal: No muscle bone or joint pain Neurologic: No headache or change in vision Skin: No rash or ecchymosis  Remaining ROS negative.     Physical Exam: Blood pressure 130/81, pulse 81, temperature 97.3 F (36.3 C), temperature source Oral, resp. rate 20, height 5\' 7"  (1.702 m), weight 191 lb 4.8 oz (86.773 kg). Wt Readings from Last 3 Encounters:  07/26/13 191 lb 4.8 oz (86.773 kg)  01/25/13 200 lb 8 oz (90.946  kg)  01/20/12 193 lb 8 oz (87.771 kg)     General appearance: Well-nourished Caucasian woman HENNT: Pharynx no erythema or exudate. Thyroid symmetrically enlarged no nodules. Lymph nodes: No cervical, supraclavicular, or axillary adenopathy Breasts: Lungs: Clear to auscultation resonant to percussion Heart: Regular rhythm no murmur Abdomen:  Soft, nontender, no mass, no organomegaly Extremities: No edema, no calf tenderness Musculoskeletal: No joint deformities GU: Vascular: No carotid bruits, no cyanosis Neurologic: Mental status intact, cranial nerves grossly normal, motor strength 5 over 5, reflexes 1+ symmetric Skin: No rash or ecchymosis  Lab Results: CBC W/Diff    Component Value Date/Time   WBC 4.7 07/18/2013 0827   WBC 4.1 12/11/2011 0739   RBC 3.72 07/18/2013 0827   RBC 3.84* 12/11/2011 0739   HGB 8.7* 07/18/2013 0827   HGB 10.2* 12/11/2011 0739   HCT 27.2* 07/18/2013 0827   HCT 31.1* 12/11/2011 0739   PLT 141* 07/18/2013 0827   PLT 142* 12/11/2011 0739   MCV 73.0* 07/18/2013 0827   MCV 81.0 12/11/2011 0739   MCH 23.3* 07/18/2013 0827   MCH 26.6 12/11/2011 0739   MCHC 32.0 07/18/2013 0827   MCHC 32.8 12/11/2011 0739   RDW 18.6* 07/18/2013 0827   RDW 14.9 12/11/2011 0739   LYMPHSABS 1.9 07/18/2013 0827   LYMPHSABS 1.4 12/11/2011 0739   MONOABS 0.3 07/18/2013 0827   MONOABS 0.2 12/11/2011 0739   EOSABS 0.1 07/18/2013 0827   EOSABS 0.1 12/11/2011 0739   BASOSABS 0.0 07/18/2013 0827   BASOSABS 0.0 12/11/2011 0739     Chemistry      Component Value Date/Time   NA 139 07/18/2013 0827   NA 138 01/20/2012 1452   K 4.5 07/18/2013 0827   K 3.4* 01/20/2012 1452   CL 100 01/20/2012 1452   CO2 24 07/18/2013 0827   CO2 31 01/20/2012 1452   BUN 9.8 07/18/2013 0827   BUN 13 01/20/2012 1452   CREATININE 1.0 07/18/2013 0827   CREATININE 0.99 01/20/2012 1452   CREATININE 0.91 02/06/2011 0856      Component Value Date/Time   CALCIUM 9.1 07/18/2013 0827   CALCIUM 9.3 01/20/2012 1452   ALKPHOS 66 07/18/2013 0827   ALKPHOS 79 01/20/2012 1452   AST 16 07/18/2013 0827   AST 24 01/20/2012 1452   ALT 15 07/18/2013 0827   ALT 25 01/20/2012 1452   BILITOT 0.47 07/18/2013 0827   BILITOT 0.3 01/20/2012 1452    LDH: 205  Impression: #1. Positive direct Coombs test with no evidence for hemolysis Most recent assessments since April 2009 have shown the Coombs test is  now negative.  #2. IgM monoclonal gammopathy of undetermined significance. Both #1 and #2 suggest she has some underlying immune dysfunction but this has not manifested itself in any clinical symptoms to date. Previous bone marrow negative for myeloma or Waldenstrm's as noted. I will continue to monitor lab parameters periodically.  #3. Iron deficiency anemia secondary to menorrhagia See discussion above. She is encouraged to go back on oral iron until she makes a decision about an endometrial ablation procedure. She is done having children.  #4. History of ulcerative esophagitis  More than 30 minutes spent with this patient with more than 50% in counseling and review of data.  Marland Kitchen    Levert Feinstein, MD 10/14/20145:40 PM

## 2013-07-26 NOTE — Telephone Encounter (Signed)
gv and printed appt sched and avs for pt for [facility] 2015

## 2013-11-16 ENCOUNTER — Telehealth: Payer: Self-pay | Admitting: *Deleted

## 2013-11-16 NOTE — Telephone Encounter (Addendum)
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  She wants to know if she should have any preventative like tamiflu. She has no symptoms but is just asking for Dr Azucena Freed opinion.  Note to Dr Beryle Beams.  Called pt back & informed no need to take tamiflu unless she gets symptoms herself.

## 2013-12-12 ENCOUNTER — Encounter: Payer: Self-pay | Admitting: Oncology

## 2014-01-17 ENCOUNTER — Other Ambulatory Visit: Payer: Commercial Managed Care - PPO

## 2014-01-24 ENCOUNTER — Ambulatory Visit: Payer: Commercial Managed Care - PPO | Admitting: Oncology

## 2014-02-14 ENCOUNTER — Other Ambulatory Visit (INDEPENDENT_AMBULATORY_CARE_PROVIDER_SITE_OTHER): Payer: Commercial Managed Care - PPO

## 2014-02-14 DIAGNOSIS — D509 Iron deficiency anemia, unspecified: Secondary | ICD-10-CM

## 2014-02-14 DIAGNOSIS — R799 Abnormal finding of blood chemistry, unspecified: Secondary | ICD-10-CM

## 2014-02-14 DIAGNOSIS — D472 Monoclonal gammopathy: Secondary | ICD-10-CM

## 2014-02-14 DIAGNOSIS — R768 Other specified abnormal immunological findings in serum: Secondary | ICD-10-CM

## 2014-02-14 LAB — CBC WITH DIFFERENTIAL/PLATELET
BASOS PCT: 0 % (ref 0–1)
Basophils Absolute: 0 10*3/uL (ref 0.0–0.1)
EOS ABS: 0.1 10*3/uL (ref 0.0–0.7)
Eosinophils Relative: 3 % (ref 0–5)
HCT: 30.1 % — ABNORMAL LOW (ref 36.0–46.0)
Hemoglobin: 9.8 g/dL — ABNORMAL LOW (ref 12.0–15.0)
Lymphocytes Relative: 48 % — ABNORMAL HIGH (ref 12–46)
Lymphs Abs: 1.5 10*3/uL (ref 0.7–4.0)
MCH: 26.4 pg (ref 26.0–34.0)
MCHC: 32.6 g/dL (ref 30.0–36.0)
MCV: 81.1 fL (ref 78.0–100.0)
Monocytes Absolute: 0.2 10*3/uL (ref 0.1–1.0)
Monocytes Relative: 5 % (ref 3–12)
NEUTROS PCT: 44 % (ref 43–77)
Neutro Abs: 1.4 10*3/uL — ABNORMAL LOW (ref 1.7–7.7)
PLATELETS: 120 10*3/uL — AB (ref 150–400)
RBC: 3.71 MIL/uL — AB (ref 3.87–5.11)
RDW: 16.5 % — ABNORMAL HIGH (ref 11.5–15.5)
WBC: 3.1 10*3/uL — ABNORMAL LOW (ref 4.0–10.5)

## 2014-02-14 LAB — COMPLETE METABOLIC PANEL WITH GFR
ALBUMIN: 3.8 g/dL (ref 3.5–5.2)
ALT: 18 U/L (ref 0–35)
AST: 17 U/L (ref 0–37)
Alkaline Phosphatase: 56 U/L (ref 39–117)
BILIRUBIN TOTAL: 0.5 mg/dL (ref 0.2–1.2)
BUN: 14 mg/dL (ref 6–23)
CO2: 28 mEq/L (ref 19–32)
Calcium: 9.3 mg/dL (ref 8.4–10.5)
Chloride: 103 mEq/L (ref 96–112)
Creat: 0.93 mg/dL (ref 0.50–1.10)
GFR, EST AFRICAN AMERICAN: 88 mL/min
GFR, EST NON AFRICAN AMERICAN: 76 mL/min
GLUCOSE: 85 mg/dL (ref 70–99)
POTASSIUM: 4.1 meq/L (ref 3.5–5.3)
Sodium: 137 mEq/L (ref 135–145)
TOTAL PROTEIN: 6.6 g/dL (ref 6.0–8.3)

## 2014-02-14 LAB — LACTATE DEHYDROGENASE: LDH: 187 U/L (ref 94–250)

## 2014-02-14 LAB — RETICULOCYTES
ABS Retic: 115 10*3/uL (ref 19.0–186.0)
RBC.: 3.71 MIL/uL — ABNORMAL LOW (ref 3.87–5.11)
Retic Ct Pct: 3.1 % — ABNORMAL HIGH (ref 0.4–2.3)

## 2014-02-14 LAB — IRON AND TIBC
%SAT: 22 % (ref 20–55)
Iron: 76 ug/dL (ref 42–145)
TIBC: 352 ug/dL (ref 250–470)
UIBC: 276 ug/dL (ref 125–400)

## 2014-02-14 LAB — FERRITIN: Ferritin: 6 ng/mL — ABNORMAL LOW (ref 10–291)

## 2014-02-15 LAB — IGM: IgM, Serum: 986 mg/dL — ABNORMAL HIGH (ref 52–322)

## 2014-02-21 ENCOUNTER — Encounter: Payer: Self-pay | Admitting: Oncology

## 2014-02-21 ENCOUNTER — Ambulatory Visit (INDEPENDENT_AMBULATORY_CARE_PROVIDER_SITE_OTHER): Payer: Commercial Managed Care - PPO | Admitting: Oncology

## 2014-02-21 VITALS — BP 131/84 | HR 80 | Temp 97.9°F | Ht 67.0 in | Wt 200.3 lb

## 2014-02-21 DIAGNOSIS — R768 Other specified abnormal immunological findings in serum: Secondary | ICD-10-CM

## 2014-02-21 DIAGNOSIS — D472 Monoclonal gammopathy: Secondary | ICD-10-CM

## 2014-02-21 DIAGNOSIS — D509 Iron deficiency anemia, unspecified: Secondary | ICD-10-CM

## 2014-02-21 DIAGNOSIS — R799 Abnormal finding of blood chemistry, unspecified: Secondary | ICD-10-CM

## 2014-02-21 NOTE — Progress Notes (Signed)
Patient ID: Kara Perkins, female   DOB: August 21, 1971, 43 y.o.   MRN: [id number] Hematology and Oncology Follow Up Visit  KIMBERLI WINNE [id number] 1970-12-12 43 y.o. 02/21/2014 3:55 PM   Principle Diagnosis: Encounter Diagnoses  Name Primary?  . IgM monoclonal gammopathy of uncertain significance Yes  . Positive Coombs test      Interim History:   Clinical history:  43 year old woman I first saw when she was pregnant back in 2008 and was found to have a positive direct Coombs' test. She never demonstrated any signs of hemolysis. She was followed closely through the pregnancy and blood counts remained stable. Over the years, the positive Coombs test extinguished. As part of her initial evaluation, serum immunoglobulins were done. She had a mild elevation of IgM. No concomitant suppression of her other immunoglobulins. I had forgotten about this until she had some GI problems back in March of 2011 when she developed acute ulcerative esophagitis. As part of her GI evaluation immunoglobulin studies were done and she showed a persistent elevation of IgM. She was reevaluated.. Initial value for IgM was 425 mg percent compared with 530 mg percent done by her gastroenterologist. I have been monitoring IgM levels closely since that time. There has been a slow but steady trend for the IgM levels to be going up. A single 24-hour total urine protein was elevated at 667 mg in a pattern of nonselective proteinuria on 12/30/2009 but this has not been reproducible. Subsequent study done 07/17/2010 with total urine protein on 89 mg, on 02/06/2011 18 mg. There is a monoclonal IgM kappa paraprotein in the urine as well as serum. In addition to the slow rise in her IgM, there has also been a trend for progressive anemia, intermittent leukopenia, and intermittent fall in her platelet count but no single platelet count has been less than 124,000 until today when platelet count is 120,000. Interpreting her hemoglobin  has been complicated by the fact that she is chronically iron deficient from menorrhagia.  CT scan of the abdomen and pelvis done 01/02/2010 did not image any lymphadenopathy or splenomegaly. I did a bone marrow aspiration and biopsy on 12/11/2011. There was no evidence for either Waldenstrm's or multiple myeloma on that specimen.  She remains asymptomatic. However, lab done in anticipation of today's visit shows progressive leukopenia with total white count 3144% neutrophils, 8% lymphocytes, I percent monocytes, 3 eosinophils. Platelet count down from 141,000 and October 2014 to 120,000 today. Hemoglobin has come up from 8.7 g to 9.8 g on oral iron replacement. Serum iron has normalized. Ferritin is still low and I advised her to continue the iron.  Her husband has had a shoulder injury and she is out on family leave to assist him. Her 2 children are doing well.     Medications: reviewed  Allergies: No Known Allergies  Review of Systems: Hematology:  No bleeding or bruising ENT ROS: No sore throat. She does she felt a swollen gland on the left neck. Breast ROS:  Respiratory ROS: No cough or dyspnea Cardiovascular ROS:  No chest pain or palpitations Gastrointestinal ROS:  No abdominal pain or change in bowel habit  Genito-Urinary ROS: Menses remain regular Musculoskeletal ROS: No muscle bone or joint pain. Neurological ROS: No headache or change in vision Dermatological ROS: No rash or ecchymosis Remaining ROS negative:   Physical Exam: Blood pressure 131/84, pulse 80, temperature 97.9 F (36.6 C), temperature source Oral, height '5\' 7"'  (1.702 m), weight 200 lb 4.8 oz (90.855  kg), SpO2 99.00%. Wt Readings from Last 3 Encounters:  02/21/14 200 lb 4.8 oz (90.855 kg)  07/26/13 191 lb 4.8 oz (86.773 kg)  01/25/13 200 lb 8 oz (90.946 kg)     General appearance: Well-nourished Caucasian woman HENNT: Pharynx no erythema, exudate, mass, or ulcer. No thyromegaly or thyroid nodules Lymph  nodes: No cervical, supraclavicular, or axillary lymphadenopathy Breasts: No abnormal skin changes, no dominant mass in either breast Lungs: Clear to auscultation, resonant to percussion throughout Heart: Regular rhythm, no murmur, no gallop, no rub, no click, no edema Abdomen: Soft, nontender, normal bowel sounds, no mass, difficult exam due to body habitus but I believe I can not palpate her spleen about 5 cm below left costal margin. No hepatomegaly. Extremities: No edema, no calf tenderness Musculoskeletal: no joint deformities GU:  Vascular: Carotid pulses 2+, no bruits, distal pulses: Dorsalis pedis 1+ symmetric Neurologic: Alert, oriented, PERRLA,  cranial nerves grossly normal, motor strength 5 over 5, reflexes 1+ symmetric, upper body coordination normal, gait normal, Skin: No rash or ecchymosis  Lab Results: CBC W/Diff    Component Value Date/Time   WBC 3.1* 02/14/2014 1105   WBC 4.7 07/18/2013 0827   RBC 3.71* 02/14/2014 1105   RBC 3.71* 02/14/2014 1105   RBC 3.72 07/18/2013 0827   HGB 9.8* 02/14/2014 1105   HGB 8.7* 07/18/2013 0827   HCT 30.1* 02/14/2014 1105   HCT 27.2* 07/18/2013 0827   PLT 120* 02/14/2014 1105   PLT 141* 07/18/2013 0827   MCV 81.1 02/14/2014 1105   MCV 73.0* 07/18/2013 0827   MCH 26.4 02/14/2014 1105   MCH 23.3* 07/18/2013 0827   MCHC 32.6 02/14/2014 1105   MCHC 32.0 07/18/2013 0827   RDW 16.5* 02/14/2014 1105   RDW 18.6* 07/18/2013 0827   LYMPHSABS 1.5 02/14/2014 1105   LYMPHSABS 1.9 07/18/2013 0827   MONOABS 0.2 02/14/2014 1105   MONOABS 0.3 07/18/2013 0827   EOSABS 0.1 02/14/2014 1105   EOSABS 0.1 07/18/2013 0827   BASOSABS 0.0 02/14/2014 1105   BASOSABS 0.0 07/18/2013 0827     Chemistry      Component Value Date/Time   NA 137 02/14/2014 1105   NA 139 07/18/2013 0827   K 4.1 02/14/2014 1105   K 4.5 07/18/2013 0827   CL 103 02/14/2014 1105   CO2 28 02/14/2014 1105   CO2 24 07/18/2013 0827   BUN 14 02/14/2014 1105   BUN 9.8 07/18/2013 0827   CREATININE 0.93 02/14/2014 1105    CREATININE 1.0 07/18/2013 0827   CREATININE 0.99 01/20/2012 1452   CREATININE 0.91 02/06/2011 0856      Component Value Date/Time   CALCIUM 9.3 02/14/2014 1105   CALCIUM 9.1 07/18/2013 0827   ALKPHOS 56 02/14/2014 1105   ALKPHOS 66 07/18/2013 0827   AST 17 02/14/2014 1105   AST 16 07/18/2013 0827   ALT 18 02/14/2014 1105   ALT 15 07/18/2013 0827   BILITOT 0.5 02/14/2014 1105   BILITOT 0.47 07/18/2013 0827    IgM 986 mg percent done on 02/15/2012 compare with 813 in October 2014 and 796 in April 2014.   Impression:  #1. Evolving hematologic process which I still haven't been able to characterize fully. We are now seeing progressive rise in her IgM paraprotein, progressive pancytopenia. Possible splenomegaly on my exam today. Despite negative evaluation in 2011 and again in 2013, I think she deserves a repeat evaluation at this time. My differential at this point includes Waldenstrm's macroglobulinemia or marginal cell lymphoma.  I will obtain a CT scan of the abdomen and pelvis to see if I can confirm splenomegaly. We discussed that I might need to do another bone marrow biopsy. If she does have isolated splenomegaly, then splenectomy may be diagnostic and therapeutic. Both Waldenstrm's and marginal cell lymphoma  are low-grade B cell processes and we have made significant progress in their treatment just in the last few years, in fact, even in the last few months now that ibrutinib is approved for first-line treatment of Waldenstrm's and second line treatment of low-grade B-cell lymphoma. We have a number of additional options including Rituxan antibody therapy and a number of chemotherapeutic agents as well. I'll schedule a short-term followup visit to see if labs are reproducible and to review the CT scan results.  #2. Iron deficiency anemia  #3. 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 History of ulcerative esophagitis.    CC: Patient Care Team: Demetrio Lapping, MD as PCP - General (Family  Medicine)   Annia Belt, MD 5/12/20153:55 PM

## 2014-02-21 NOTE — Patient Instructions (Signed)
CT scan to evaluate spleen - office will schedule Follow up visit with Dr Julienne Kass on June 30  lab 1 week before visit at [facility] clinic on  6/23

## 2014-02-28 ENCOUNTER — Encounter: Payer: Self-pay | Admitting: Oncology

## 2014-02-28 ENCOUNTER — Ambulatory Visit (HOSPITAL_COMMUNITY): Payer: Commercial Managed Care - PPO

## 2014-02-28 NOTE — Progress Notes (Signed)
Ct a/p w/contrast 03/01/14 @ [facility] auth # [id number] 02/28/14-03/30/14

## 2014-03-01 ENCOUNTER — Ambulatory Visit (HOSPITAL_COMMUNITY)
Admission: RE | Admit: 2014-03-01 | Discharge: 2014-03-01 | Disposition: A | Discharge status: Home / Self Care | Payer: Commercial Managed Care - PPO | Source: Ambulatory Visit | Attending: Oncology | Admitting: Oncology

## 2014-03-01 DIAGNOSIS — N83209 Unspecified ovarian cyst, unspecified side: Secondary | ICD-10-CM | POA: Insufficient documentation

## 2014-03-01 DIAGNOSIS — K838 Other specified diseases of biliary tract: Secondary | ICD-10-CM | POA: Insufficient documentation

## 2014-03-01 DIAGNOSIS — R161 Splenomegaly, not elsewhere classified: Secondary | ICD-10-CM | POA: Insufficient documentation

## 2014-03-01 DIAGNOSIS — R768 Other specified abnormal immunological findings in serum: Secondary | ICD-10-CM

## 2014-03-01 DIAGNOSIS — D472 Monoclonal gammopathy: Secondary | ICD-10-CM | POA: Insufficient documentation

## 2014-03-01 MED ORDER — IOHEXOL 300 MG/ML  SOLN
100.0000 mL | Freq: Once | INTRAMUSCULAR | Status: AC | PRN
  Administered 2014-03-01: 100 mL via INTRAVENOUS

## 2014-03-02 ENCOUNTER — Other Ambulatory Visit: Payer: Self-pay | Admitting: Oncology

## 2014-03-02 ENCOUNTER — Telehealth: Payer: Self-pay | Admitting: *Deleted

## 2014-03-02 NOTE — Telephone Encounter (Signed)
Pt had called and left a message - anxious/requesting results of CT scan. Thanks [phone].

## 2014-03-03 NOTE — Telephone Encounter (Signed)
F/U call - stated she had talked to Dr Granfortuna. 

## 2014-03-22 ENCOUNTER — Encounter (HOSPITAL_COMMUNITY): Payer: Self-pay | Admitting: Pharmacy Technician

## 2014-03-22 ENCOUNTER — Other Ambulatory Visit: Payer: Self-pay | Admitting: Oncology

## 2014-03-22 DIAGNOSIS — D472 Monoclonal gammopathy: Secondary | ICD-10-CM

## 2014-03-23 ENCOUNTER — Encounter (HOSPITAL_COMMUNITY): Payer: Self-pay

## 2014-03-23 ENCOUNTER — Ambulatory Visit (HOSPITAL_COMMUNITY)
Admission: RE | Admit: 2014-03-23 | Discharge: 2014-03-23 | Disposition: A | Discharge status: Home / Self Care | Payer: Commercial Managed Care - PPO | Source: Ambulatory Visit | Attending: Oncology | Admitting: Oncology

## 2014-03-23 VITALS — BP 126/76 | HR 78 | Temp 97.8°F | Resp 18

## 2014-03-23 DIAGNOSIS — D472 Monoclonal gammopathy: Secondary | ICD-10-CM | POA: Diagnosis not present

## 2014-03-23 DIAGNOSIS — R238 Other skin changes: Secondary | ICD-10-CM | POA: Insufficient documentation

## 2014-03-23 DIAGNOSIS — D704 Cyclic neutropenia: Secondary | ICD-10-CM | POA: Insufficient documentation

## 2014-03-23 DIAGNOSIS — D61818 Other pancytopenia: Secondary | ICD-10-CM | POA: Insufficient documentation

## 2014-03-23 LAB — CBC WITH DIFFERENTIAL/PLATELET
BASOS ABS: 0 10*3/uL (ref 0.0–0.1)
BASOS PCT: 0 % (ref 0–1)
EOS PCT: 2 % (ref 0–5)
Eosinophils Absolute: 0.1 10*3/uL (ref 0.0–0.7)
HEMATOCRIT: 30 % — AB (ref 36.0–46.0)
Hemoglobin: 9.7 g/dL — ABNORMAL LOW (ref 12.0–15.0)
Lymphocytes Relative: 43 % (ref 12–46)
Lymphs Abs: 1.4 10*3/uL (ref 0.7–4.0)
MCH: 26.7 pg (ref 26.0–34.0)
MCHC: 32.3 g/dL (ref 30.0–36.0)
MCV: 82.6 fL (ref 78.0–100.0)
MONO ABS: 0.3 10*3/uL (ref 0.1–1.0)
Monocytes Relative: 9 % (ref 3–12)
NEUTROS ABS: 1.5 10*3/uL — AB (ref 1.7–7.7)
Neutrophils Relative %: 46 % (ref 43–77)
Platelets: 90 10*3/uL — ABNORMAL LOW (ref 150–400)
RBC: 3.63 MIL/uL — ABNORMAL LOW (ref 3.87–5.11)
RDW: 15.1 % (ref 11.5–15.5)
WBC: 3.2 10*3/uL — ABNORMAL LOW (ref 4.0–10.5)

## 2014-03-23 LAB — BONE MARROW EXAM

## 2014-03-23 MED ORDER — SODIUM CHLORIDE 0.9 % IV SOLN
INTRAVENOUS | Status: DC
  Administered 2014-03-23: 500 mL via INTRAVENOUS

## 2014-03-23 MED ORDER — MIDAZOLAM HCL 10 MG/2ML IJ SOLN
10.0000 mg | Freq: Once | INTRAMUSCULAR | Status: AC
  Administered 2014-03-23: 2.5 mg via INTRAVENOUS
  Filled 2014-03-23: qty 2

## 2014-03-23 MED ORDER — MIDAZOLAM HCL 10 MG/2ML IJ SOLN: 10.0000 mg | Freq: Once | INTRAMUSCULAR | Status: DC

## 2014-03-23 MED ORDER — MIDAZOLAM BOLUS VIA INFUSION
10.0000 mg | Freq: Once | INTRAVENOUS | Status: DC
  Filled 2014-03-23: qty 10

## 2014-03-23 NOTE — Sedation Documentation (Signed)
dsg applied

## 2014-03-23 NOTE — Sedation Documentation (Signed)
Patient denies pain and is resting comfortably.  

## 2014-03-23 NOTE — Procedures (Signed)
Bone Marrow Biopsy and Aspiration Procedure Note   Informed consent was obtained and potential risks including bleeding, infection and pain were reviewed with the patient.  Red Rule procedure followed  Posterior iliac crest(s) prepped with Betadine.  Lidocaine 2% local anesthesia infiltrated into the subcutaneous tissue.  Premedication: versed 2.5 mg IV x 2 = 5 mg  Left bone marrow biopsy and  aspirate was obtained.   The procedure was tolerated well and there were no complications.  Specimens sent for routine histopathologic stains and sectioning, flow cytometry and cytogenetics  Indication: Rising IgM level; progressive splenomegaly R/O lymphoma  Physician: Annia Belt

## 2014-03-23 NOTE — Sedation Documentation (Signed)
MD at bedside. 

## 2014-03-23 NOTE — Sedation Documentation (Signed)
dsg cdi 

## 2014-03-23 NOTE — Sedation Documentation (Signed)
MD at bedside. 0844 dr Beryle Beams back in to answer questions pt has

## 2014-03-23 NOTE — Sedation Documentation (Signed)
Medication dose calculated and verified for: 4.5mg 

## 2014-03-23 NOTE — Discharge Instructions (Signed)
Do not drive  For 24 hours Do not go into public places today May resume your regular diet and take home medications as usual May experience small amount of tingling in leg (biopsy side) May take shower and remove bandage in am For any questions or concerns, call dr If bleeding occurs at site, hold pressure x10 minutes  If continues, call doctorBone Marrow Aspiration, Bone Marrow Biopsy Care After Read the instructions outlined below and refer to this sheet in the next few weeks. These discharge instructions provide you with general information on caring for yourself after you leave the hospital. Your caregiver may also give you specific instructions. While your treatment has been planned according to the most current medical practices available, unavoidable complications occasionally occur. If you have any problems or questions after discharge, call your caregiver. FINDING OUT THE RESULTS OF YOUR TEST Not all test results are available during your visit. If your test results are not back during the visit, make an appointment with your caregiver to find out the results. Do not assume everything is normal if you have not heard from your caregiver or the medical facility. It is important for you to follow up on all of your test results.  HOME CARE INSTRUCTIONS  You have had sedation and may be sleepy or dizzy. Your thinking may not be as clear as usual. For the next 24 hours:  Only take over-the-counter or prescription medicines for pain, discomfort, and or fever as directed by your caregiver.  Do not drink alcohol.  Do not smoke.  Do not drive.  Do not make important legal decisions.  Do not operate heavy machinery.  Do not care for small children by yourself.  Keep your dressing clean and dry. You may replace dressing with a bandage after 24 hours.  You may take a bath or shower after 24 hours.  Use an ice pack for 20 minutes every 2 hours while awake for pain as needed. SEEK MEDICAL  CARE IF:   There is redness, swelling, or increasing pain at the biopsy site.  There is pus coming from the biopsy site.  There is drainage from a biopsy site lasting longer than one day.  An unexplained oral temperature above 102 F (38.9 C) develops. SEEK IMMEDIATE MEDICAL CARE IF:   You develop a rash.  You have difficulty breathing.  You develop any reaction or side effects to medications given. Document Released: 04/18/2005 Document Revised: 12/22/2011 Document Reviewed: 09/26/2008 [facility] Patient Information 2014 [place].

## 2014-03-23 NOTE — Sedation Documentation (Signed)
Patient is resting comfortably. 

## 2014-03-23 NOTE — Sedation Documentation (Signed)
Family updated as to patient's status.

## 2014-03-23 NOTE — Sedation Documentation (Signed)
Vital signs stable. 

## 2014-03-23 NOTE — Sedation Documentation (Signed)
md finished procedure at [phone]

## 2014-03-31 LAB — CHROMOSOME ANALYSIS, BONE MARROW

## 2014-03-31 LAB — TISSUE HYBRIDIZATION (BONE MARROW)-NCBH

## 2014-04-04 ENCOUNTER — Other Ambulatory Visit (INDEPENDENT_AMBULATORY_CARE_PROVIDER_SITE_OTHER): Payer: Commercial Managed Care - PPO

## 2014-04-04 DIAGNOSIS — D472 Monoclonal gammopathy: Secondary | ICD-10-CM

## 2014-04-04 DIAGNOSIS — R799 Abnormal finding of blood chemistry, unspecified: Secondary | ICD-10-CM

## 2014-04-04 DIAGNOSIS — R768 Other specified abnormal immunological findings in serum: Secondary | ICD-10-CM

## 2014-04-04 LAB — CBC WITH DIFFERENTIAL/PLATELET
BASOS ABS: 0 10*3/uL (ref 0.0–0.1)
BASOS PCT: 0 % (ref 0–1)
Eosinophils Absolute: 0.1 10*3/uL (ref 0.0–0.7)
Eosinophils Relative: 2 % (ref 0–5)
HCT: 31.8 % — ABNORMAL LOW (ref 36.0–46.0)
Hemoglobin: 10.4 g/dL — ABNORMAL LOW (ref 12.0–15.0)
LYMPHS PCT: 44 % (ref 12–46)
Lymphs Abs: 1.7 10*3/uL (ref 0.7–4.0)
MCH: 27.3 pg (ref 26.0–34.0)
MCHC: 32.7 g/dL (ref 30.0–36.0)
MCV: 83.5 fL (ref 78.0–100.0)
MONO ABS: 0.2 10*3/uL (ref 0.1–1.0)
Monocytes Relative: 4 % (ref 3–12)
NEUTROS ABS: 1.9 10*3/uL (ref 1.7–7.7)
NEUTROS PCT: 50 % (ref 43–77)
Platelets: 122 10*3/uL — ABNORMAL LOW (ref 150–400)
RBC: 3.81 MIL/uL — ABNORMAL LOW (ref 3.87–5.11)
RDW: 15.5 % (ref 11.5–15.5)
WBC: 3.8 10*3/uL — ABNORMAL LOW (ref 4.0–10.5)

## 2014-04-04 LAB — RETICULOCYTES
ABS RETIC: 123.8 10*3/uL (ref 19.0–186.0)
RBC.: 3.81 MIL/uL — AB (ref 3.87–5.11)
RETIC CT PCT: 3.25 % — AB (ref 0.4–2.3)

## 2014-04-04 LAB — SAVE SMEAR

## 2014-04-05 LAB — DIRECT ANTIGLOBULIN TEST (NOT AT ARMC)
DAT (COMPLEMENT): POSITIVE — AB
DAT IgG: POSITIVE — AB

## 2014-04-05 LAB — IGG, IGA, IGM
IGA: 206 mg/dL (ref 69–380)
IgG (Immunoglobin G), Serum: 743 mg/dL (ref 690–1700)
IgM, Serum: 1130 mg/dL — ABNORMAL HIGH (ref 52–322)

## 2014-04-06 LAB — IMMUNOFIXATION ELECTROPHORESIS
IGG (IMMUNOGLOBIN G), SERUM: 743 mg/dL (ref 690–1700)
IGM, SERUM: 1130 mg/dL — AB (ref 52–322)
IgA: 206 mg/dL (ref 69–380)
Total Protein, Serum Electrophoresis: 7.1 g/dL (ref 6.0–8.3)

## 2014-04-11 ENCOUNTER — Other Ambulatory Visit: Payer: Self-pay | Admitting: Oncology

## 2014-04-11 ENCOUNTER — Ambulatory Visit (INDEPENDENT_AMBULATORY_CARE_PROVIDER_SITE_OTHER): Payer: Commercial Managed Care - PPO | Admitting: Oncology

## 2014-04-11 ENCOUNTER — Encounter: Payer: Self-pay | Admitting: Oncology

## 2014-04-11 ENCOUNTER — Other Ambulatory Visit: Payer: Self-pay | Admitting: *Deleted

## 2014-04-11 VITALS — BP 145/88 | HR 73 | Temp 97.3°F | Ht 67.0 in | Wt 192.8 lb

## 2014-04-11 DIAGNOSIS — C8307 Small cell B-cell lymphoma, spleen: Secondary | ICD-10-CM | POA: Insufficient documentation

## 2014-04-11 DIAGNOSIS — D472 Monoclonal gammopathy: Secondary | ICD-10-CM

## 2014-04-11 DIAGNOSIS — R799 Abnormal finding of blood chemistry, unspecified: Secondary | ICD-10-CM

## 2014-04-11 HISTORY — DX: Small cell b-cell lymphoma, spleen: C83.07

## 2014-04-11 MED ORDER — ONDANSETRON 8 MG PO TBDP: 8.0000 mg | ORAL_TABLET | Freq: Three times a day (TID) | ORAL | Status: AC | PRN

## 2014-04-11 MED ORDER — SULFAMETHOXAZOLE-TMP DS 800-160 MG PO TABS: 1.0000 | ORAL_TABLET | ORAL | Status: AC

## 2014-04-11 MED ORDER — FLUCONAZOLE 100 MG PO TABS: 100.0000 mg | ORAL_TABLET | Freq: Every day | ORAL | Status: AC

## 2014-04-11 MED ORDER — ACYCLOVIR 400 MG PO TABS: 400.0000 mg | ORAL_TABLET | Freq: Two times a day (BID) | ORAL | Status: AC

## 2014-04-11 NOTE — Patient Instructions (Signed)
Lab today Schedule PET scan 1st available at [facility] We will get pre-authorization for chem plus Rituxan Target date to start Rx: Thursday July 16 Follow up visit with Dr Beryle Beams July 28 at 11:15 AM

## 2014-04-11 NOTE — Progress Notes (Signed)
Patient ID: Kara Perkins, female   DOB: 1971/02/08, 43 y.o.   MRN: [id number] Hematology and Oncology Follow Up Visit  Kara Perkins [id number] 04/14/1971 43 y.o. 04/11/2014 3:19 PM   Principle Diagnosis: Encounter Diagnosis  Name Primary?  . 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 Her history is summarized in my office note of 02/21/2014. Although initial immune abnormalities were first discovered in 2008 and an IgM monoclonal gammopathy of undetermined significance has existed since that time as well, only over the last year has there been a change. She developed splenomegaly on exam, progressive rise in her IgM paraprotein, reversion to a positive Coombs test and modest pancytopenia. She had a nondiagnostic bone marrow aspiration and biopsy in February 2013. I repeated the bone marrow aspiration and biopsy on 03/23/2014. At this time, although there are no gross lymphoid aggregates or infiltrates, and there is a monoclonal population of B lymphocytes expressing CD20 but negative for CD5 and CD 25. 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  Since visit with me last month, she is now aware of increasing left upper quadrant discomfort.    Medications: reviewed  Allergies: No Known Allergies    Physical Exam: Blood pressure 145/88, pulse 73, temperature 97.3 F (36.3 C), temperature source Oral, height 5\' 7"  (1.702 m), weight 192 lb 12.8 oz (87.454 kg), SpO2 100.00%. Wt Readings from Last 3 Encounters:  04/11/14 192 lb 12.8 oz (87.454 kg)  02/21/14 200 lb 4.8 oz (90.855 kg)  07/26/13 191 lb 4.8 oz (86.773 kg)   the patient was not examined today.    Lab Results: CBC W/Diff    Component Value Date/Time   WBC 3.8* 04/04/2014  1019   WBC 4.7 07/18/2013 0827   RBC 3.81* 04/04/2014 1019   RBC 3.81* 04/04/2014 1019   RBC 3.72 07/18/2013 0827   HGB 10.4* 04/04/2014 1019   HGB 8.7* 07/18/2013 0827   HCT 31.8* 04/04/2014 1019   HCT 27.2* 07/18/2013 0827   PLT 122* 04/04/2014 1019   PLT 141* 07/18/2013 0827   MCV 83.5 04/04/2014 1019   MCV 73.0* 07/18/2013 0827   MCH 27.3 04/04/2014 1019   MCH 23.3* 07/18/2013 0827   MCHC 32.7 04/04/2014 1019   MCHC 32.0 07/18/2013 0827   RDW 15.5 04/04/2014 1019   RDW 18.6* 07/18/2013 0827   LYMPHSABS 1.7 04/04/2014 1019   LYMPHSABS 1.9 07/18/2013 0827   MONOABS 0.2 04/04/2014 1019   MONOABS 0.3 07/18/2013 0827   EOSABS 0.1 04/04/2014 1019   EOSABS 0.1 07/18/2013 0827   BASOSABS 0.0 04/04/2014 1019   BASOSABS 0.0 07/18/2013 0827     Chemistry      Component Value Date/Time   NA 137 02/14/2014 1105   NA 139 07/18/2013 0827   K 4.1 02/14/2014 1105   K 4.5 07/18/2013 0827   CL 103 02/14/2014 1105   CO2 28 02/14/2014 1105   CO2 24 07/18/2013 0827   BUN 14 02/14/2014 1105   BUN 9.8 07/18/2013 0827   CREATININE 0.93 02/14/2014 1105   CREATININE 1.0 07/18/2013 0827   CREATININE 0.99 01/20/2012 1452   CREATININE 0.91 02/06/2011 0856      Component Value Date/Time   CALCIUM 9.3 02/14/2014 1105   CALCIUM 9.1 07/18/2013 0827   ALKPHOS 56  02/14/2014 1105   ALKPHOS 66 07/18/2013 0827   AST 17 02/14/2014 1105   AST 16 07/18/2013 0827   ALT 18 02/14/2014 1105   ALT 15 07/18/2013 0827   BILITOT 0.5 02/14/2014 1105   BILITOT 0.47 07/18/2013 0827       Impression:  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  I reviewed the current literature: Although splenectomy as first therapeutic approach remains a reasonable option, some experts are shifting away from this approach in favor of a more traditional approach to low-grade lymphoma combining Rituxan with chemotherapy. It  appears that the lymphocytes involved with marginal zone lymphoma have a higher expression of CD20 and are more sensitive to Rituxan than other lymphomas frequently giving very high initial responses in 80-100% of patients including some molecular complete remissions. All series reporting on this lymphoma are small since it is rare comprising only 2% of non-Hodgkin's lymphomas with only 20% of that cohort having splenic marginal zone lymphoma. The largest series reported looking at splenectomy alone in 100 patient's followed long-term reported by Lenglet et 2014 Leukemia Lymphoma [id number] showed a median progression free survival of 8.25 years with a 5 and 10 year overall survival of 84 and 76% respectively. 32% of patients required initiation of additional treatment within a median of 2.3 years of splenectomy. 10% of patients converted to high-grade lymphoma.  In another series of 66 patients, 10 patients treated with splenectomy alone, all responded to Rituxan with a complete response at time of progression. Guadalupe Dawn et al 2012 Viviana Simpler of hematology November 1 59 (3): 322-8).  Best contemporary series looking at chemotherapy plus Rituxan reported in 2013 annals of oncology 24:2434  Cervetti et al, employed dose modified cladribine given 5 mg per meter squared once a week for 6 weeks instead of the traditional 5 days in a row which minimized infectious toxicity, along with weekly Rituxan  in 47 evaluable patients. 87% overall response with 51% complete responses. 15 of 24 complete responders also got a molecular remission. Five-year progression free survival 80%. Overall survival 86% with 100% five-year progression free survival if PCR negative.  All series employing combinations of chemotherapy and Rituxan showed superior results with the addition of Rituxan which is not surprising.  I reviewed these data in detail with the patient and her husband. She is more in favor of the chemotherapy plus Rituxan  strategy rather than initial splenectomy.  Since the treatment using a purine analog plus Rituxan is giving the best results in the literature at present, I propose that we use that treatment plan. Although substituting cladribine with Bendamustine might also be reasonable, we really don't have any precedent for this with respect to an abbreviated program so that the number of cycles of bendamustine would be arbitrary. We know that a single cycle of cladribine results in prolonged remissions in hairy cell leukemia and the results reported above in marginal zone lymphoma seem to reflect a similar outcome with a short course of therapy.  In going to get a PET scan to complete her staging evaluation. I will discuss her situation with my colleagues in conference next week. I would plan on beginning treatment in mid July. We reviewed potential side effects of all of the drugs which would primarily be allergic reactions with Rituxan, immunosuppression with decreased blood counts and risk for opportunistic infections with cladribine. I am prescribing prophylactic Septra, fluconazole,  and acyclovir to begin when she starts the program. I will arrange for a chemotherapy education session.  Over 90 minutes spent with the patient and her husband with 100% in direct counseling and coordination of care.    CC: Patient Care Team: Sofie Hartigan, MD as PCP - General (Family Medicine)   Annia Belt, MD 6/30/20153:19 PM

## 2014-04-12 LAB — COMPREHENSIVE METABOLIC PANEL
ALK PHOS: 65 U/L (ref 39–117)
ALT: 14 U/L (ref 0–35)
AST: 15 U/L (ref 0–37)
Albumin: 4.1 g/dL (ref 3.5–5.2)
BILIRUBIN TOTAL: 0.7 mg/dL (ref 0.2–1.2)
BUN: 14 mg/dL (ref 6–23)
CALCIUM: 9.1 mg/dL (ref 8.4–10.5)
CO2: 28 mEq/L (ref 19–32)
Chloride: 102 mEq/L (ref 96–112)
Creat: 0.85 mg/dL (ref 0.50–1.10)
Glucose, Bld: 95 mg/dL (ref 70–99)
Potassium: 4.2 mEq/L (ref 3.5–5.3)
Sodium: 137 mEq/L (ref 135–145)
Total Protein: 6.7 g/dL (ref 6.0–8.3)

## 2014-04-12 LAB — LACTATE DEHYDROGENASE: LDH: 169 U/L (ref 94–250)

## 2014-04-12 LAB — HIV ANTIBODY (ROUTINE TESTING W REFLEX): HIV: NONREACTIVE

## 2014-04-12 LAB — HEPATITIS PANEL, ACUTE
HCV Ab: NEGATIVE
HEP A IGM: NONREACTIVE
HEP B C IGM: NONREACTIVE
Hepatitis B Surface Ag: NEGATIVE

## 2014-04-13 ENCOUNTER — Telehealth: Payer: Self-pay | Admitting: Hematology and Oncology

## 2014-04-13 ENCOUNTER — Telehealth: Payer: Self-pay | Admitting: *Deleted

## 2014-04-13 NOTE — Telephone Encounter (Signed)
Called pt - informed pt Hepatitis A,B,C,HIV test results are all negative per Dr Beryle Beams. Also Dr Alvy Bimler will help coordinate chemo treatments - stated she already has an appt Wed 7/8.

## 2014-04-13 NOTE — Telephone Encounter (Signed)
Message copied by Ebbie Latus on Thu Apr 13, 2014  4:23 PM ------      Message from: Annia Belt      Created: Thu Apr 13, 2014 11:24 AM       Call pt: hepatitis A,B,C, HIV all negative.      Dr Alvy Bimler will see her at the cancer center to help coordinate chemotherapy treatments. ------

## 2014-04-13 NOTE — Telephone Encounter (Signed)
per 6/30 pof from [place] pt to have ched class and start rituxan 7/16. pt currently on schedule w/NG as NP 7/8. message to NG to clarify is we should hold off on this pof until pt see her 7/8. pof removed from [place] inbox.

## 2014-04-18 ENCOUNTER — Telehealth: Payer: Self-pay | Admitting: Hematology and Oncology

## 2014-04-18 NOTE — Telephone Encounter (Signed)
per 7/5 reply from NG proceed w/pof sent by JG 6/30 to schedule tx. added ched for 7/10 and tx to start 7/16 per 6/30 pof. pt to see NG tomorrow (new pt) and will be given schedule then.

## 2014-04-19 ENCOUNTER — Encounter: Payer: Self-pay | Admitting: Hematology and Oncology

## 2014-04-19 ENCOUNTER — Ambulatory Visit: Payer: Commercial Managed Care - PPO

## 2014-04-19 ENCOUNTER — Ambulatory Visit (HOSPITAL_COMMUNITY)
Admission: RE | Admit: 2014-04-19 | Discharge: 2014-04-19 | Disposition: A | Discharge status: Home / Self Care | Payer: Commercial Managed Care - PPO | Source: Ambulatory Visit | Attending: Oncology | Admitting: Oncology

## 2014-04-19 ENCOUNTER — Telehealth: Payer: Self-pay | Admitting: Hematology and Oncology

## 2014-04-19 ENCOUNTER — Ambulatory Visit (HOSPITAL_BASED_OUTPATIENT_CLINIC_OR_DEPARTMENT_OTHER): Payer: Commercial Managed Care - PPO | Admitting: Hematology and Oncology

## 2014-04-19 VITALS — BP 132/79 | HR 74 | Temp 97.0°F | Resp 19 | Ht 67.0 in | Wt 194.1 lb

## 2014-04-19 DIAGNOSIS — D630 Anemia in neoplastic disease: Secondary | ICD-10-CM

## 2014-04-19 DIAGNOSIS — C8307 Small cell B-cell lymphoma, spleen: Secondary | ICD-10-CM

## 2014-04-19 DIAGNOSIS — R161 Splenomegaly, not elsewhere classified: Secondary | ICD-10-CM

## 2014-04-19 DIAGNOSIS — C8589 Other specified types of non-Hodgkin lymphoma, extranodal and solid organ sites: Secondary | ICD-10-CM | POA: Insufficient documentation

## 2014-04-19 DIAGNOSIS — Z9089 Acquired absence of other organs: Secondary | ICD-10-CM | POA: Insufficient documentation

## 2014-04-19 DIAGNOSIS — D696 Thrombocytopenia, unspecified: Secondary | ICD-10-CM

## 2014-04-19 DIAGNOSIS — R599 Enlarged lymph nodes, unspecified: Secondary | ICD-10-CM | POA: Insufficient documentation

## 2014-04-19 DIAGNOSIS — D72819 Decreased white blood cell count, unspecified: Secondary | ICD-10-CM

## 2014-04-19 LAB — GLUCOSE, CAPILLARY: Glucose-Capillary: 83 mg/dL (ref 70–99)

## 2014-04-19 MED ORDER — PREDNISONE 20 MG PO TABS: 60.0000 mg | ORAL_TABLET | Freq: Every day | ORAL | Status: DC

## 2014-04-19 MED ORDER — FLUDEOXYGLUCOSE F - 18 (FDG) INJECTION
9.8000 | Freq: Once | INTRAVENOUS | Status: AC | PRN
  Administered 2014-04-19: 9.8 via INTRAVENOUS

## 2014-04-19 NOTE — Telephone Encounter (Signed)
gv and printed appt sched and avs for pt for July and Aug °

## 2014-04-19 NOTE — Progress Notes (Signed)
Checked in new patient with no financial issues prior to seeing the dr. She has not been out of the country and she has her appt card.

## 2014-04-20 ENCOUNTER — Telehealth: Payer: Self-pay | Admitting: *Deleted

## 2014-04-20 DIAGNOSIS — D696 Thrombocytopenia, unspecified: Secondary | ICD-10-CM | POA: Insufficient documentation

## 2014-04-20 DIAGNOSIS — D72819 Decreased white blood cell count, unspecified: Secondary | ICD-10-CM | POA: Insufficient documentation

## 2014-04-20 DIAGNOSIS — D630 Anemia in neoplastic disease: Secondary | ICD-10-CM | POA: Insufficient documentation

## 2014-04-20 NOTE — Assessment & Plan Note (Signed)
Her mild leukopenia is likely due to disease and splenomegaly. She is not symptomatic. I recommend observation only.

## 2014-04-20 NOTE — Assessment & Plan Note (Addendum)
We discussed the role of chemotherapy. The intent is for cure, although she is aware of risk of future relapses.  We discussed some of the risks, benefits, side-effects of Rituximab.  Some of the short term side-effects included, though not limited to, risk of fatigue, allergic reactions, nausea, vomiting, admission to hospital for various reasons, and risks of death.   The patient is aware that the response rates discussed earlier is not guaranteed.    After a long discussion, patient made an informed decision to proceed with the prescribed plan of care and went ahead to sign the consent form today.   Patient education material was dispensed. To reduce her risk of allergic reaction, and was stopped her on 60 mg of prednisone for 2 days prior to treatment.

## 2014-04-20 NOTE — Telephone Encounter (Signed)
Call from pt - states she had the PET scan also had appt w/Dr Alvy Bimler, who she thought was very impressive. States Dr Alvy Bimler gave her the option of Rituxan or Rutuxan and chemo. She wants to know if u can call her on your opinion about her treatment options. Telephone# [phone].  Thanks

## 2014-04-20 NOTE — Assessment & Plan Note (Signed)
This is likely anemia of chronic disease. The patient denies recent history of bleeding such as epistaxis, hematuria or hematochezia. She is asymptomatic from the anemia. We will observe for now.  She does not require transfusion now.   

## 2014-04-20 NOTE — Progress Notes (Signed)
[facility] FOLLOW-UP progress notes  Patient Care Team: Sofie Hartigan, MD as PCP - General (Family Medicine)  CHIEF COMPLAINTS/PURPOSE OF VISIT:  Marginal zone lymphoma.  HISTORY OF PRESENTING ILLNESS:  Kara Perkins 43 y.o. female was transferred to my care after her prior physician has left.  I reviewed the patient's records extensive and collaborated the history with the patient. Summary of her history is as follows:   Lymphoma, marginal zone, spleen   03/23/2014 Bone Marrow Biopsy Bone marrow is involved   04/19/2014 Imaging PET CT scan showed lymphadenopathy and splenomegaly  She was seen by another hematologist she was pregnant back in 2008 and was found to have a positive direct Coombs' test. She never demonstrated any significant hemolysis. She was followed closely through the pregnancy and blood counts remained stable. Over the years, the positive Coombs test extinguished. As part of her initial evaluation, serum immunoglobulins were done. She had a mild elevation of IgM. No concomitant suppression of her other immunoglobulins. She had some GI problems back in March of 2011 when she developed  acute ulcerative esophagitis. As part of her GI evaluation immunoglobulin studies were done and she showed a persistent elevation of IgM. She was reevaluated in this office. Initial value for IgM was 425 mg percent compared with 530 mg percent done by her gastroenterologist. There has been a slow but steady trend for the IgM levels to be going up. A single 24-hour total urine protein was elevated at 667 mg in a pattern of nonselective proteinuria on 12/30/2009 but this has not been reproducible. Subsequent study done 07/17/2010 with total urine protein on 89 mg, on 02/06/2011 18 mg. There is a monoclonal IgM kappa paraprotein in the urine as well as serum. In addition to the slow rise in her IgM, there has  Also been a trend for progressive anemia, intermittent leukopenia, and  intermittent fall in her platelet count but no single platelet count has been less than 124,000. Interpreting her hemoglobin has been complicated by the fact that she is chronically iron deficient from menorrhagia. Bone marrow aspiration and biopsy on 12/11/2011 showed no evidence for either Waldenstrm's or multiple myeloma on that specimen.  Subsequently, she started to have early satiety and was found to have splenomegaly. Further evaluation including bone marrow biopsy and PET/CT scan confirmed diagnosis of low-grade marginal zone lymphoma. Treatment is recommended and she is being referred to see me. MEDICAL HISTORY:  Past Medical History  Diagnosis Date  . Positive Coombs test   . 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 Positive Coombs test 11/05/2011  . Iron deficiency anemia 11/05/2011  . Perimenopausal menorrhagia 07/26/2013  . Lymphoma, marginal zone, spleen 04/11/2014    SURGICAL HISTORY: Past Surgical History  Procedure Laterality Date  . Cholecystectomy      SOCIAL HISTORY: History   Social History  . Marital Status: Married    Spouse Name: N/A    Number of Children: N/A  . Years of Education: N/A   Occupational History  . Not on file.   Social History Main Topics  . Smoking status: Never Smoker   . Smokeless tobacco: Never Used  . Alcohol Use: No  . Drug Use: No  . Sexual Activity: Not on file   Other Topics Concern  . Not on file   Social History Narrative  . No narrative on file    FAMILY HISTORY: History reviewed. No pertinent family history.  ALLERGIES:  has No Known  Allergies.  MEDICATIONS:  Current Outpatient Prescriptions  Medication Sig Dispense Refill  . [START ON 04/27/2014] acyclovir (ZOVIRAX) 400 MG tablet Take 1 tablet (400 mg total) by mouth 2 (two) times daily.  60 tablet  2  . Cholecalciferol (VITAMIN D) 2000 UNITS tablet Take 2,000 Units by mouth daily.      Derrill Memo ON 04/27/2014] fluconazole (DIFLUCAN) 100 MG  tablet Take 1 tablet (100 mg total) by mouth daily.  30 tablet  2  . ibuprofen (ADVIL,MOTRIN) 400 MG tablet Take 400 mg by mouth every 6 (six) hours as needed.      . iron polysaccharides (NIFEREX) 150 MG capsule Take 1 capsule (150 mg total) by mouth 2 (two) times daily.  120 capsule  11  . [START ON 04/27/2014] ondansetron (ZOFRAN-ODT) 8 MG disintegrating tablet Take 1 tablet (8 mg total) by mouth every 8 (eight) hours as needed for nausea or vomiting.  20 tablet  3  . Probiotic Product (PROBIOTIC DAILY PO) Take by mouth daily.      Derrill Memo ON 04/27/2014] sulfamethoxazole-trimethoprim (BACTRIM DS) 800-160 MG per tablet Take 1 tablet by mouth 3 (three) times a week.  24 tablet  1  . predniSONE (DELTASONE) 20 MG tablet Take 3 tablets (60 mg total) by mouth daily with breakfast.  6 tablet  0   No current facility-administered medications for this visit.    REVIEW OF SYSTEMS:   Constitutional: Denies fevers, chills she has some mild weight loss recently. Eyes: Denies blurriness of vision, double vision or watery eyes Ears, nose, mouth, throat, and face: Denies mucositis or sore throat Respiratory: Denies cough, dyspnea or wheezes Cardiovascular: Denies palpitation, chest discomfort or lower extremity swelling Gastrointestinal:  Denies nausea, heartburn or change in bowel habits Skin: Denies abnormal skin rashes Lymphatics: She has new palpable lymphadenopathy in the right axilla. Neurological:Denies numbness, tingling or new weaknesses Behavioral/Psych: Mood is stable, no new changes  All other systems were reviewed with the patient and are negative.  PHYSICAL EXAMINATION: ECOG PERFORMANCE STATUS: 1 - Symptomatic but completely ambulatory  Filed Vitals:   04/19/14 1233  BP: 132/79  Pulse: 74  Temp: 97 F (36.1 C)  Resp: 19   Filed Weights   04/19/14 1233  Weight: 194 lb 1.6 oz (88.043 kg)    GENERAL:alert, no distress and comfortable. She is moderately obese SKIN: skin color,  texture, turgor are normal, no rashes or significant lesions EYES: normal, conjunctiva are pink and non-injected, sclera clear OROPHARYNX:no exudate, normal lips, buccal mucosa, and tongue  NECK: supple, thyroid normal size, non-tender, without nodularity LYMPH: No palpable lymphadenopathy in the right axilla. LUNGS: clear to auscultation and percussion with normal breathing effort HEART: regular rate & rhythm and no murmurs without lower extremity edema ABDOMEN:abdomen soft, non-tender and normal bowel sounds. Splenomegaly is present Musculoskeletal:no cyanosis of digits and no clubbing  PSYCH: alert & oriented x 3 with fluent speech NEURO: no focal motor/sensory deficits  LABORATORY DATA:  I have reviewed the data as listed Lab Results  Component Value Date   WBC 3.8* 04/04/2014   HGB 10.4* 04/04/2014   HCT 31.8* 04/04/2014   MCV 83.5 04/04/2014   PLT 122* 04/04/2014    Recent Labs  07/18/13 0827 02/14/14 1105 04/11/14 1059  NA 139 137 137  K 4.5 4.1 4.2  CL  --  103 102  CO2 _0 GLUCOSE 91 85 95  BUN 9._1 CREATININE 1.0 0.93 0.85  CALCIUM 9.1 9.3  9.1  GFRNONAA  --  76  --   GFRAA  --  88  --   PROT 7.4 6.6 6.7  ALBUMIN 3.6 3.8 4.1  AST _0 ALT _1 ALKPHOS 66 56 65  BILITOT 0.47 0.5 0.7    RADIOGRAPHIC STUDIES: I have personally reviewed the radiological images as listed and agreed with the findings in the report. Nm Pet Image Initial (pi) Skull Base To Thigh  04/20/2014   CLINICAL DATA:  Initial treatment strategy for lymphoma.  EXAM: NUCLEAR MEDICINE PET SKULL BASE TO THIGH  TECHNIQUE: 9.8 mCi F-18 FDG was injected intravenously. Full-ring PET imaging was performed from the skull base to thigh after the radiotracer. CT data was obtained and used for attenuation correction and anatomic localization.  FASTING BLOOD GLUCOSE:  Value: 83 mg/dl  COMPARISON:  CT of the abdomen and pelvis 03/01/2014. Chest CT 01/02/2010.  FINDINGS: NECK  Submandibular  lymph nodes (level 1B) are enlarged bilaterally measuring up to 2.4 cm, and are hypermetabolic (SUVmax = 6.9-7.9).  CHEST  Multiple borderline enlarged and mildly enlarged axillary lymph nodes bilaterally, largest of which is on the right side measuring up to 14 mm in short axis. These are hypermetabolic (SUVmax = 4.8-0.1). More inferiorly along the chest wall in the lateral aspect of the breasts bilaterally there are additional lymph nodes which are also borderline to mildly enlarged, largest of which is on the right side (image 76 of series 5) measuring up to 12 mm in short axis (SUVmax = 3.2). No hypermetabolic mediastinal or hilar nodes. No suspicious pulmonary nodules on the CT scan.  ABDOMEN/PELVIS  Spleen is massively enlarged measuring 14.7 x 9.9 x 23.3 cm (estimated splenic volume of 1,541 mL), and is hypermetabolic (SUVmax = 3.8). No abnormal hypermetabolic activity within the liver, pancreas, or adrenal glands. Multiple enlarged lymph nodes are noted throughout the upper abdomen, demonstrating some hypermetabolism. Specific examples include a 1.6 cm short axis portacaval lymph node (SUVmax = 4.3), and a 2.3 cm gastrohepatic ligament lymph node (SUVmax = 4.5). Numerous borderline enlarged and mildly enlarged bilateral inguinal lymph nodes are noted measuring up to 1.2 cm on the left and 1 cm on the right (SUVmax = 2.6-4.2).  Status post cholecystectomy. Normal appendix. No significant volume of ascites. No pneumoperitoneum. No pathologic distention of small bowel. Uterus and ovaries are unremarkable in appearance.  SKELETON  No focal hypermetabolic activity to suggest skeletal metastasis.  IMPRESSION: 1. Hypermetabolic lymphadenopathy in the neck, chest, abdomen and pelvis, as above, in addition to a massively enlarged and hypermetabolic spleen, compatible with the reported clinical history of lymphoma. 2. Additional incidental findings, as above.   Electronically Signed   By: Vinnie Langton M.D.   On:  04/20/2014 08:38    ASSESSMENT & PLAN:  Lymphoma, marginal zone, spleen We discussed the role of chemotherapy. The intent is for cure, although she is aware of risk of future relapses.  We discussed some of the risks, benefits, side-effects of Rituximab.  Some of the short term side-effects included, though not limited to, risk of fatigue, allergic reactions, nausea, vomiting, admission to hospital for various reasons, and risks of death.   The patient is aware that the response rates discussed earlier is not guaranteed.    After a long discussion, patient made an informed decision to proceed with the prescribed plan of care and went ahead to sign the consent form today.   Patient education material was dispensed. To reduce her  risk of allergic reaction, and was stopped her on 60 mg of prednisone for 2 days prior to treatment.   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 The patient denies recent history of bleeding such as epistaxis, hematuria or hematochezia. She is asymptomatic from the anemia. We will observe for now.  She does not require transfusion now.    Thrombocytopenia, unspecified The cause is related to splenomegaly and disease. It is mild and there is little change compared from previous platelet count. The patient denies recent history of bleeding such as epistaxis, hematuria or hematochezia. She is asymptomatic from the thrombocytopenia. I will observe for now.  she does not require transfusion now.     her mild leukopenia is likely due to disease and splenomegaly. She is not symptomatic. I recommend observation only.  Orders Placed This Encounter  Procedures  . CBC & Diff and Retic    Standing Status: Standing     Number of Occurrences: 9     Standing Expiration Date: 04/20/2015  . Lactate dehydrogenase    Standing Status: Standing     Number of Occurrences: 9     Standing Expiration Date: 04/20/2015  . Comprehensive metabolic panel     Standing Status: Standing     Number of Occurrences: 9     Standing Expiration Date: 04/20/2015    All questions were answered. The patient knows to call the clinic with any problems, questions or concerns. I spent 40 minutes counseling the patient face to face. The total time spent in the appointment was 55 minutes and more than 50% was on counseling.     [facility], [place], MD 04/20/2014 9:36 PM

## 2014-04-20 NOTE — Assessment & Plan Note (Signed)
The cause is related to splenomegaly and disease. It is mild and there is little change compared from previous platelet count. The patient denies recent history of bleeding such as epistaxis, hematuria or hematochezia. She is asymptomatic from the thrombocytopenia. I will observe for now.  she does not require transfusion now.

## 2014-04-21 ENCOUNTER — Other Ambulatory Visit: Payer: Commercial Managed Care - PPO

## 2014-04-21 ENCOUNTER — Telehealth: Payer: Self-pay | Admitting: *Deleted

## 2014-04-21 NOTE — Telephone Encounter (Signed)
Message copied by Cathlean Cower on Fri Apr 21, 2014  8:50 AM ------      Message from: [facility], [place]: Thu Apr 20, 2014  9:33 PM      Regarding: PET       Please let her know she has enlarged LN. Will review PET scan in her next visit, would not change management ------

## 2014-04-21 NOTE — Telephone Encounter (Signed)
Left pt another VM asking her to return nurse's call when able.

## 2014-04-21 NOTE — Telephone Encounter (Signed)
Call from pt - wanting to know if FMLA papers have been completed. FMLA papers are in MD's box. Pt states chemo will be starting soon and need papers for her job as soon as possible. Thanks

## 2014-04-21 NOTE — Telephone Encounter (Signed)
Talked to pt this morning - she had spoken to Dr Beryle Beams.

## 2014-04-21 NOTE — Telephone Encounter (Signed)
Spoke w/ pt for a few seconds and lost connection.  Called her back and left VM asking her to return nurse's call.

## 2014-04-22 ENCOUNTER — Other Ambulatory Visit: Payer: Self-pay | Admitting: Hematology and Oncology

## 2014-04-22 DIAGNOSIS — C8307 Small cell B-cell lymphoma, spleen: Secondary | ICD-10-CM

## 2014-04-22 MED ORDER — PROCHLORPERAZINE MALEATE 10 MG PO TABS: 10.0000 mg | ORAL_TABLET | Freq: Four times a day (QID) | ORAL | Status: DC | PRN

## 2014-04-22 MED ORDER — ONDANSETRON HCL 8 MG PO TABS: 8.0000 mg | ORAL_TABLET | Freq: Three times a day (TID) | ORAL | Status: DC | PRN

## 2014-04-24 ENCOUNTER — Telehealth: Payer: Self-pay | Admitting: *Deleted

## 2014-04-24 ENCOUNTER — Telehealth: Payer: Self-pay | Admitting: Hematology and Oncology

## 2014-04-24 NOTE — Telephone Encounter (Signed)
I spoke with the patient over the telephone. I verified that she agreed to proceed with weekly cladribine with rituximab. I will modify her chemotherapy and consents.

## 2014-04-24 NOTE — Telephone Encounter (Signed)
Informed pt of PET scan results showed enlarged LN per Dr. Alvy Bimler and she will review on next office visit. Pt verbalized understanding.  Pt states Dr. Alvy Bimler had actually called her this morning about her treatment and they are going to add "chemo" to the Rituxan.

## 2014-04-25 ENCOUNTER — Other Ambulatory Visit: Payer: Commercial Managed Care - PPO

## 2014-04-25 ENCOUNTER — Telehealth: Payer: Self-pay | Admitting: *Deleted

## 2014-04-25 NOTE — Telephone Encounter (Signed)
Pt wants to know if it is ok for her children to get up to date on their Vaccines?  Specifically her daughter is due for Tdap and Meningitis vaccine prior to starting 6 th grade.

## 2014-04-25 NOTE — Telephone Encounter (Signed)
Pt coming in for Chemo education class tonight.  Left message for chemo education RN to inform pt of ok for her children to have their vaccinations.

## 2014-04-25 NOTE — Telephone Encounter (Signed)
Yes, no problem!

## 2014-04-27 ENCOUNTER — Other Ambulatory Visit: Payer: Self-pay | Admitting: Hematology and Oncology

## 2014-04-27 ENCOUNTER — Ambulatory Visit (HOSPITAL_BASED_OUTPATIENT_CLINIC_OR_DEPARTMENT_OTHER): Payer: Commercial Managed Care - PPO | Admitting: Nurse Practitioner

## 2014-04-27 ENCOUNTER — Ambulatory Visit (HOSPITAL_BASED_OUTPATIENT_CLINIC_OR_DEPARTMENT_OTHER): Payer: Commercial Managed Care - PPO

## 2014-04-27 ENCOUNTER — Other Ambulatory Visit (HOSPITAL_BASED_OUTPATIENT_CLINIC_OR_DEPARTMENT_OTHER): Payer: Commercial Managed Care - PPO

## 2014-04-27 VITALS — BP 118/74 | HR 87 | Temp 98.0°F | Resp 16

## 2014-04-27 DIAGNOSIS — T7840XA Allergy, unspecified, initial encounter: Secondary | ICD-10-CM

## 2014-04-27 DIAGNOSIS — Z5112 Encounter for antineoplastic immunotherapy: Secondary | ICD-10-CM

## 2014-04-27 DIAGNOSIS — Z5111 Encounter for antineoplastic chemotherapy: Secondary | ICD-10-CM

## 2014-04-27 DIAGNOSIS — C8307 Small cell B-cell lymphoma, spleen: Secondary | ICD-10-CM

## 2014-04-27 DIAGNOSIS — T451X5A Adverse effect of antineoplastic and immunosuppressive drugs, initial encounter: Secondary | ICD-10-CM

## 2014-04-27 DIAGNOSIS — R112 Nausea with vomiting, unspecified: Secondary | ICD-10-CM

## 2014-04-27 LAB — COMPREHENSIVE METABOLIC PANEL (CC13)
ALT: 16 U/L (ref 0–55)
ANION GAP: 10 meq/L (ref 3–11)
AST: 11 U/L (ref 5–34)
Albumin: 4 g/dL (ref 3.5–5.0)
Alkaline Phosphatase: 60 U/L (ref 40–150)
BUN: 17 mg/dL (ref 7.0–26.0)
CO2: 22 meq/L (ref 22–29)
Calcium: 9.5 mg/dL (ref 8.4–10.4)
Chloride: 107 mEq/L (ref 98–109)
Creatinine: 1 mg/dL (ref 0.6–1.1)
Glucose: 91 mg/dl (ref 70–140)
POTASSIUM: 4 meq/L (ref 3.5–5.1)
SODIUM: 138 meq/L (ref 136–145)
TOTAL PROTEIN: 7.8 g/dL (ref 6.4–8.3)
Total Bilirubin: 0.52 mg/dL (ref 0.20–1.20)

## 2014-04-27 LAB — CBC & DIFF AND RETIC
BASO%: 0.1 % (ref 0.0–2.0)
BASOS ABS: 0 10*3/uL (ref 0.0–0.1)
EOS%: 0 % (ref 0.0–7.0)
Eosinophils Absolute: 0 10*3/uL (ref 0.0–0.5)
HEMATOCRIT: 33.1 % — AB (ref 34.8–46.6)
HEMOGLOBIN: 10.7 g/dL — AB (ref 11.6–15.9)
Immature Retic Fract: 16.4 % — ABNORMAL HIGH (ref 1.60–10.00)
LYMPH%: 33.6 % (ref 14.0–49.7)
MCH: 27 pg (ref 25.1–34.0)
MCHC: 32.3 g/dL (ref 31.5–36.0)
MCV: 83.4 fL (ref 79.5–101.0)
MONO#: 0.5 10*3/uL (ref 0.1–0.9)
MONO%: 6.4 % (ref 0.0–14.0)
NEUT%: 59.9 % (ref 38.4–76.8)
NEUTROS ABS: 4.9 10*3/uL (ref 1.5–6.5)
PLATELETS: 141 10*3/uL — AB (ref 145–400)
RBC: 3.97 10*6/uL (ref 3.70–5.45)
RDW: 15.7 % — ABNORMAL HIGH (ref 11.2–14.5)
Retic %: 4.49 % — ABNORMAL HIGH (ref 0.70–2.10)
Retic Ct Abs: 178.25 10*3/uL — ABNORMAL HIGH (ref 33.70–90.70)
WBC: 8.2 10*3/uL (ref 3.9–10.3)
lymph#: 2.8 10*3/uL (ref 0.9–3.3)
nRBC: 0 % (ref 0–0)

## 2014-04-27 LAB — LACTATE DEHYDROGENASE (CC13): LDH: 180 U/L (ref 125–245)

## 2014-04-27 MED ORDER — RITUXIMAB CHEMO INJECTION 10 MG/ML
375.0000 mg/m2 | Freq: Once | INTRAVENOUS | Status: AC
  Administered 2014-04-27: 800 mg via INTRAVENOUS
  Filled 2014-04-27: qty 80

## 2014-04-27 MED ORDER — ONDANSETRON 16 MG/50ML IVPB (CHCC)
INTRAVENOUS | Status: AC
  Filled 2014-04-27: qty 16

## 2014-04-27 MED ORDER — METHYLPREDNISOLONE SODIUM SUCC 125 MG IJ SOLR
125.0000 mg | Freq: Once | INTRAMUSCULAR | Status: AC | PRN
  Administered 2014-04-27: 125 mg via INTRAVENOUS

## 2014-04-27 MED ORDER — ONDANSETRON 16 MG/50ML IVPB (CHCC)
16.0000 mg | Freq: Once | INTRAVENOUS | Status: AC
  Administered 2014-04-27: 16 mg via INTRAVENOUS

## 2014-04-27 MED ORDER — SODIUM CHLORIDE 0.9 % IV SOLN
0.1000 mg/kg | INTRAVENOUS | Status: DC
  Administered 2014-04-27: 9 mg via INTRAVENOUS
  Filled 2014-04-27: qty 9

## 2014-04-27 MED ORDER — ACETAMINOPHEN 325 MG PO TABS
ORAL_TABLET | ORAL | Status: AC
  Filled 2014-04-27: qty 2

## 2014-04-27 MED ORDER — DIPHENHYDRAMINE HCL 25 MG PO CAPS
ORAL_CAPSULE | ORAL | Status: AC
  Filled 2014-04-27: qty 2

## 2014-04-27 MED ORDER — LORAZEPAM 2 MG/ML IJ SOLN
INTRAMUSCULAR | Status: AC
  Filled 2014-04-27: qty 1

## 2014-04-27 MED ORDER — SODIUM CHLORIDE 0.9 % IV SOLN
Freq: Once | INTRAVENOUS | Status: AC
  Administered 2014-04-27: 10:00:00 via INTRAVENOUS

## 2014-04-27 MED ORDER — LORAZEPAM 2 MG/ML IJ SOLN
0.5000 mg | INTRAMUSCULAR | Status: AC
  Administered 2014-04-27: 0.5 mg via INTRAVENOUS

## 2014-04-27 MED ORDER — FAMOTIDINE IN NACL 20-0.9 MG/50ML-% IV SOLN
20.0000 mg | Freq: Once | INTRAVENOUS | Status: AC | PRN
  Administered 2014-04-27: 20 mg via INTRAVENOUS

## 2014-04-27 MED ORDER — ACETAMINOPHEN 325 MG PO TABS
650.0000 mg | ORAL_TABLET | Freq: Once | ORAL | Status: AC
  Administered 2014-04-27: 650 mg via ORAL

## 2014-04-27 MED ORDER — DIPHENHYDRAMINE HCL 25 MG PO CAPS
50.0000 mg | ORAL_CAPSULE | Freq: Once | ORAL | Status: AC
  Administered 2014-04-27: 50 mg via ORAL

## 2014-04-27 NOTE — Progress Notes (Signed)
Approximately 1400 during first rituxan infusion (at second to last bump up) pt began vomiting.  Rituxan infusion paused, Selena Lesser, NP notified.  Selena Lesser to bedside 1405, pt states she is feeling better after vomiting.  Instructed by NP to restart infusion at previous rate.  Rituxan restarted at 1420.  Approximately 1430 patient began vomiting again, infusion held.  Selena Lesser paged, RN instructed to administer Pepcid 20mg  IV, Solumedrol 125mg  IV, Zofran 16mg  IV, and hold infusion until told otherwise.  See Mar for administration times.  Selena Lesser to bedside approximately 1520, pt stating she feels much better.  Instructed by NP to restart infusion at1525.  Rituxan infusion finished at 1545, pt began vomiting again.  NP notified.  Pt given 0.5mg  ativan at 1610.  Selena Lesser and Dr Alvy Bimler to bedside at that time.  Ok to proceed with Cladrabine per Dr Alvy Bimler.  Cladrabine started 1612.  VSS stable throughout episde.  No other distress to patient.  Will continue to monitor.

## 2014-04-27 NOTE — Patient Instructions (Signed)
[place] Discharge Instructions for Patients Receiving Chemotherapy  Today you received the following chemotherapy agents rituxan/cladribine.    To help prevent nausea and vomiting after your treatment, we encourage you to take your nausea medication as directed.    If you develop nausea and vomiting that is not controlled by your nausea medication, call the clinic.   BELOW ARE SYMPTOMS THAT SHOULD BE REPORTED IMMEDIATELY:  *FEVER GREATER THAN 100.5 F  *CHILLS WITH OR WITHOUT FEVER  NAUSEA AND VOMITING THAT IS NOT CONTROLLED WITH YOUR NAUSEA MEDICATION  *UNUSUAL SHORTNESS OF BREATH  *UNUSUAL BRUISING OR BLEEDING  TENDERNESS IN MOUTH AND THROAT WITH OR WITHOUT PRESENCE OF ULCERS  *URINARY PROBLEMS  *BOWEL PROBLEMS  UNUSUAL RASH Items with * indicate a potential emergency and should be followed up as soon as possible.  Feel free to call the clinic you have any questions or concerns. The clinic phone number is [phone].   Rituximab injection What is this medicine? RITUXIMAB (ri TUX i mab) is a monoclonal antibody. This medicine changes the way the body's immune system works. It is used commonly to treat non-Hodgkin's lymphoma and other conditions. In cancer cells, this drug targets a specific protein within cancer cells and stops the cancer cells from growing. It is also used to treat rhuematoid arthritis (RA). In RA, this medicine slow the inflammatory process and help reduce joint pain and swelling. This medicine is often used with other cancer or arthritis medications. This medicine may be used for other purposes; ask your health care provider or pharmacist if you have questions. COMMON BRAND NAME(S): Rituxan What should I tell my health care provider before I take this medicine? They need to know if you have any of these conditions: -blood disorders -heart disease -history of hepatitis B -infection (especially a virus infection such as chickenpox,  cold sores, or herpes) -irregular heartbeat -kidney disease -lung or breathing disease, like asthma -lupus -an unusual or allergic reaction to rituximab, mouse proteins, other medicines, foods, dyes, or preservatives -pregnant or trying to get pregnant -breast-feeding How should I use this medicine? This medicine is for infusion into a vein. It is administered in a hospital or clinic by a specially trained health care professional. A special MedGuide will be given to you by the pharmacist with each prescription and refill. Be sure to read this information carefully each time. Talk to your pediatrician regarding the use of this medicine in children. This medicine is not approved for use in children. Overdosage: If you think you have taken too much of this medicine contact a poison control center or emergency room at once. NOTE: This medicine is only for you. Do not share this medicine with others. What if I miss a dose? It is important not to miss a dose. Call your doctor or health care professional if you are unable to keep an appointment. What may interact with this medicine? -cisplatin -medicines for blood pressure -some other medicines for arthritis -vaccines This list may not describe all possible interactions. Give your health care provider a list of all the medicines, herbs, non-prescription drugs, or dietary supplements you use. Also tell them if you smoke, drink alcohol, or use illegal drugs. Some items may interact with your medicine. What should I watch for while using this medicine? Report any side effects that you notice during your treatment right away, such as changes in your breathing, fever, chills, dizziness or lightheadedness. These effects are more common with the first dose. Visit  your prescriber or health care professional for checks on your progress. You will need to have regular blood work. Report any other side effects. The side effects of this medicine can continue  after you finish your treatment. Continue your course of treatment even though you feel ill unless your doctor tells you to stop. Call your doctor or health care professional for advice if you get a fever, chills or sore throat, or other symptoms of a cold or flu. Do not treat yourself. This drug decreases your body's ability to fight infections. Try to avoid being around people who are sick. This medicine may increase your risk to bruise or bleed. Call your doctor or health care professional if you notice any unusual bleeding. Be careful brushing and flossing your teeth or using a toothpick because you may get an infection or bleed more easily. If you have any dental work done, tell your dentist you are receiving this medicine. Avoid taking products that contain aspirin, acetaminophen, ibuprofen, naproxen, or ketoprofen unless instructed by your doctor. These medicines may hide a fever. Do not become pregnant while taking this medicine. Women should inform their doctor if they wish to become pregnant or think they might be pregnant. There is a potential for serious side effects to an unborn child. Talk to your health care professional or pharmacist for more information. Do not breast-feed an infant while taking this medicine. What side effects may I notice from receiving this medicine? Side effects that you should report to your doctor or health care professional as soon as possible: -allergic reactions like skin rash, itching or hives, swelling of the face, lips, or tongue -low blood counts - this medicine may decrease the number of white blood cells, red blood cells and platelets. You may be at increased risk for infections and bleeding. -signs of infection - fever or chills, cough, sore throat, pain or difficulty passing urine -signs of decreased platelets or bleeding - bruising, pinpoint red spots on the skin, black, tarry stools, blood in the urine -signs of decreased red blood cells - unusually  weak or tired, fainting spells, lightheadedness -breathing problems -confused, not responsive -chest pain -fast, irregular heartbeat -feeling faint or lightheaded, falls -mouth sores -redness, blistering, peeling or loosening of the skin, including inside the mouth -stomach pain -swelling of the ankles, feet, or hands -trouble passing urine or change in the amount of urine Side effects that usually do not require medical attention (report to your doctor or other health care professional if they continue or are bothersome): -anxiety -headache -loss of appetite -muscle aches -nausea -night sweats This list may not describe all possible side effects. Call your doctor for medical advice about side effects. You may report side effects to FDA at 1-800-FDA-1088. Where should I keep my medicine? This drug is given in a hospital or clinic and will not be stored at home. NOTE: This sheet is a summary. It may not cover all possible information. If you have questions about this medicine, talk to your doctor, pharmacist, or health care provider.  2015, Elsevier/Gold Standard. (2008-05-29 14:04:59)  Cladribine injection for infusion What is this medicine? CLADRIBINE (KLA dri been) is a chemotherapy drug. This medicine reduces the growth of cancer cells and can suppress the immune system. It is used for treating leukemias. This medicine may be used for other purposes; ask your health care provider or pharmacist if you have questions. COMMON BRAND NAME(S): Leustatin What should I tell my health care provider before I take  this medicine? They need to know if you have any of these conditions: -bleeding problems -infection (especially a virus infection such as chickenpox, cold sores, or herpes) -kidney disease -liver disease -an unusual or allergic reaction to cladribine, benzyl alcohol, other medicines, foods, dyes, or preservatives -pregnant or trying to get pregnant -breast-feeding How should  I use this medicine? This drug is given as an infusion into a vein. It is administered in a hospital or clinic by a specially trained health care professional. Talk to your pediatrician regarding the use of this medicine in children. While this drug may be prescribed for children for selected conditions, precautions do apply. Overdosage: If you think you have taken too much of this medicine contact a poison control center or emergency room at once. NOTE: This medicine is only for you. Do not share this medicine with others. What if I miss a dose? It is important not to miss a dose. Call your doctor or health care professional if you are unable to keep an appointment. What may interact with this medicine? -vaccines Talk to your doctor or health care professional before taking any of these medicines: -acetaminophen -aspirin -ibuprofen -naproxen -ketoprofen This list may not describe all possible interactions. Give your health care provider a list of all the medicines, herbs, non-prescription drugs, or dietary supplements you use. Also tell them if you smoke, drink alcohol, or use illegal drugs. Some items may interact with your medicine. What should I watch for while using this medicine? This drug may make you feel generally unwell. This is not uncommon, as chemotherapy can affect healthy cells as well as cancer cells. Report any side effects. Continue your course of treatment even though you feel ill unless your doctor tells you to stop. In some cases, you may be given additional medicines to help with side effects. Follow all directions for their use. Call your doctor or health care professional for advice if you get a fever, chills or sore throat, or other symptoms of a cold or flu. Do not treat yourself. This drug decreases your body's ability to fight infections. Try to avoid being around people who are sick. This medicine may increase your risk to bruise or bleed. Call your doctor or health  care professional if you notice any unusual bleeding. Be careful brushing and flossing your teeth or using a toothpick because you may get an infection or bleed more easily. If you have any dental work done, tell your dentist you are receiving this medicine. Avoid taking products that contain aspirin, acetaminophen, ibuprofen, naproxen, or ketoprofen unless instructed by your doctor. These medicines may hide a fever. Do not become pregnant while taking this medicine. Women should inform their doctor if they wish to become pregnant or think they might be pregnant. There is a potential for serious side effects to an unborn child. Talk to your health care professional or pharmacist for more information. Do not breast-feed an infant while taking this medicine. If you are a man, you should not father a child while receiving treatment. What side effects may I notice from receiving this medicine? Side effects that you should report to your doctor or health care professional as soon as possible: -allergic reactions like skin rash, itching or hives, swelling of the face, lips, or tongue -low blood counts - This drug may decrease the number of white blood cells, red blood cells and platelets. You may be at increased risk for infections and bleeding. -signs of infection - fever or chills,  cough, sore throat, pain or difficulty passing urine -signs of decreased platelets or bleeding - bruising, pinpoint red spots on the skin, black, tarry stools, nosebleeds -signs of decreased red blood cells - unusually weak or tired, fainting spells, lightheadedness -abdominal pain -breathing problems -dizziness -mouth sores -trouble passing urine or change in the amount of urine Side effects that usually do not require medical attention (report to your doctor or health care professional if they continue or are bothersome): -diarrhea -headache -loss of appetite -nausea, vomiting -pain or redness at the injection  site -weak or tired This list may not describe all possible side effects. Call your doctor for medical advice about side effects. You may report side effects to FDA at 1-800-FDA-1088. Where should I keep my medicine? This drug is given in a hospital or clinic and will not be stored at home. NOTE: This sheet is a summary. It may not cover all possible information. If you have questions about this medicine, talk to your doctor, pharmacist, or health care provider.  2015, Elsevier/Gold Standard. (2008-01-04 14:42:56)

## 2014-04-28 ENCOUNTER — Telehealth: Payer: Self-pay | Admitting: *Deleted

## 2014-04-28 ENCOUNTER — Encounter: Payer: Self-pay | Admitting: Nurse Practitioner

## 2014-04-28 DIAGNOSIS — T7840XA Allergy, unspecified, initial encounter: Secondary | ICD-10-CM | POA: Insufficient documentation

## 2014-04-28 NOTE — Progress Notes (Signed)
Reader   Chief Complaint  Patient presents with  . Hypersensitivity Reaction    HPI: Kara Perkins 43 y.o. female  Recently diagnosed with lymphoma.  Here today to initiate Rituxan/cladribine chemotherapy regimen.    Patient developed a probable hypersensitivity reaction to the rituximab; vomiting a total of 3 times while in the infusion area.  Patient was treated with several anti-nausea medications and Solu-Medrol which did seem to greatly relieve all nausea/vomiting issues.   CURRENT THERAPY: Upcoming Treatment Dates - LEUKEMIA HAIRY CELL Cladribine q7d Days with orders from any treatment category:  05/04/2014      SCHEDULING COMMUNICATION      prochlorperazine (COMPAZINE) tablet 10 mg      cladribine (LEUSTATIN) 9 mg in sodium chloride 0.9 % 250 mL chemo infusion      sodium chloride 0.9 % injection 10 mL      heparin lock flush 100 unit/mL      heparin lock flush 100 unit/mL      alteplase (CATHFLO ACTIVASE) injection 2 mg      sodium chloride 0.9 % injection 3 mL      0.9 %  sodium chloride infusion      TREATMENT CONDITIONS 05/11/2014      SCHEDULING COMMUNICATION      prochlorperazine (COMPAZINE) tablet 10 mg      cladribine (LEUSTATIN) 9 mg in sodium chloride 0.9 % 250 mL chemo infusion      sodium chloride 0.9 % injection 10 mL      heparin lock flush 100 unit/mL      heparin lock flush 100 unit/mL      alteplase (CATHFLO ACTIVASE) injection 2 mg      sodium chloride 0.9 % injection 3 mL      0.9 %  sodium chloride infusion      TREATMENT CONDITIONS 05/18/2014      SCHEDULING COMMUNICATION      prochlorperazine (COMPAZINE) tablet 10 mg      cladribine (LEUSTATIN) 9 mg in sodium chloride 0.9 % 250 mL chemo infusion      sodium chloride 0.9 % injection 10 mL      heparin lock flush 100 unit/mL      heparin lock flush 100 unit/mL      alteplase (CATHFLO ACTIVASE) injection 2 mg      sodium chloride 0.9 % injection 3 mL      0.9 %  sodium  chloride infusion      TREATMENT CONDITIONS   Past Medical History  Diagnosis Date  . Positive Coombs test   . 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 Positive Coombs test 11/05/2011  . Iron deficiency anemia 11/05/2011  . Perimenopausal menorrhagia 07/26/2013  . 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 Cholecystectomy      has IgM monoclonal gammopathy of uncertain significance; Positive Coombs test; Iron deficiency anemia; Perimenopausal menorrhagia; Lymphoma, marginal zone, spleen; Anemia in neoplastic disease; Thrombocytopenia, unspecified; Leukopenia; and Hypersensitivity reaction on her problem list.     has No Known Allergies.    Medication List       This list is accurate as of: 04/27/14 11:59 PM.  Always use your most recent med list.               acyclovir 400 MG tablet  Commonly known as:  ZOVIRAX  Take 1 tablet (400 mg total) by mouth 2 (two)  times daily.     fluconazole 100 MG tablet  Commonly known as:  DIFLUCAN  Take 1 tablet (100 mg total) by mouth daily.     ibuprofen 400 MG tablet  Commonly known as:  ADVIL,MOTRIN  Take 400 mg by mouth every 6 (six) hours as needed.     iron polysaccharides 150 MG capsule  Commonly known as:  NIFEREX  Take 1 capsule (150 mg total) by mouth 2 (two) times daily.     ondansetron 8 MG disintegrating tablet  Commonly known as:  ZOFRAN-ODT  Take 1 tablet (8 mg total) by mouth every 8 (eight) hours as needed for nausea or vomiting.     predniSONE 20 MG tablet  Commonly known as:  DELTASONE  Take 3 tablets (60 mg total) by mouth daily with breakfast.     PROBIOTIC DAILY PO  Take by mouth daily.     sulfamethoxazole-trimethoprim 800-160 MG per tablet  Commonly known as:  BACTRIM DS  Take 1 tablet by mouth 3 (three) times a week.     Vitamin D 2000 UNITS tablet  Take 2,000 Units by mouth daily.          PHYSICAL  EXAMINATION  Vitals: BP 117/68 HR 92, temp 98.0   GENERAL:alert, healthy, no distress, well nourished and well developed   LABORATORY DATA:. CBC  Lab Results  Component Value Date   WBC 8.2 04/27/2014   RBC 3.97 04/27/2014   HGB 10.7* 04/27/2014   HCT 33.1* 04/27/2014   PLT 141* 04/27/2014   MCV 83.4 04/27/2014   MCH 27.0 04/27/2014   MCHC 32.3 04/27/2014   RDW 15.7* 04/27/2014   LYMPHSABS 2.8 04/27/2014   MONOABS 0.5 04/27/2014   EOSABS 0.0 04/27/2014   BASOSABS 0.0 04/27/2014     CMET  Lab Results  Component Value Date   NA 138 04/27/2014   K 4.0 04/27/2014   CL 102 04/11/2014   CO2 22 04/27/2014   GLUCOSE 91 04/27/2014   BUN 17.0 04/27/2014   CREATININE 1.0 04/27/2014   CALCIUM 9.5 04/27/2014   PROT 7.8 04/27/2014   ALBUMIN 4.0 04/27/2014   AST 11 04/27/2014   ALT 16 04/27/2014   ALKPHOS 60 04/27/2014   BILITOT 0.52 04/27/2014   GFRNONAA 76 02/14/2014   GFRAA 88 02/14/2014   ASSESSMENT/PLAN:    Lymphoma, marginal zone, spleen  Assessment & Plan Patient received her first cycle of rituximab/cladribine today.  She did develop some nausea and vomited 3 times while receiving the rituximab infusion.  The patient is scheduled to return for her next cycle of chemotherapy in 7 days-which will be 05/04/2014.   Hypersensitivity reaction  Assessment & Plan Patient did develop an apparent hypersensitivity reaction while receiving the rituximab infusion.  She had just finished eating a [employer] sandwich at; and initially thought that this was the reason for her nausea and vomiting.  Rituxan infusion was held and hypersensitivity protocol initiated.  Patient did receive earlier premedications of Benadryl and Tylenol.  Patient was also given Zofran 16 mg, Pepcid 20 mg, lorazepam 0.5 mg, and Solu-Medrol 125 mg.  The patient was able to finish her rituximab infusion with no further vomiting issues.  Patient was encouraged to take her antitheft nausea medications as previously directed PRN.   Patient  stated understanding of all instructions; and was in agreement with this plan of care. The patient knows to call the clinic with any problems, questions or concerns.   Review/collaboration with Dr. Alvy Bimler regarding all aspects  of patient's visit today.   Total time spent with patient was 30 minutes;  with greater than 50 percent of that time spent in face to face counseling regarding anti-nausea medications; and need for clear liquid diet if nauseous;  and coordination of care and follow up.  Disclaimer: This note was dictated with voice recognition software. Similar sounding words can inadvertently be transcribed and may not be corrected upon review.   Drue Second, NP 04/28/2014

## 2014-04-28 NOTE — Telephone Encounter (Signed)
Called pt at home and left message on voice mail requesting a call back from pt for post chemo follow up. 

## 2014-04-28 NOTE — Assessment & Plan Note (Signed)
Patient did develop an apparent hypersensitivity reaction while receiving the rituximab infusion.  She had just finished eating a [employer] sandwich at; and initially thought that this was the reason for her nausea and vomiting.  Rituxan infusion was held and hypersensitivity protocol initiated.  Patient did receive earlier premedications of Benadryl and Tylenol.  Patient was also given Zofran 16 mg, Pepcid 20 mg, lorazepam 0.5 mg, and Solu-Medrol 125 mg.  The patient was able to finish her rituximab infusion with no further vomiting issues.  Patient was encouraged to take her antitheft nausea medications as previously directed PRN.

## 2014-04-28 NOTE — Assessment & Plan Note (Signed)
Patient received her first cycle of rituximab/cladribine today.  She did develop some nausea and vomited 3 times while receiving the rituximab infusion.  The patient is scheduled to return for her next cycle of chemotherapy in 7 days-which will be 05/04/2014.

## 2014-04-28 NOTE — Telephone Encounter (Signed)
Pt called to say she is doing well post chemo. No nausea or vomiting. No diarrhea

## 2014-05-01 ENCOUNTER — Encounter: Payer: Self-pay | Admitting: Hematology and Oncology

## 2014-05-04 ENCOUNTER — Other Ambulatory Visit (HOSPITAL_BASED_OUTPATIENT_CLINIC_OR_DEPARTMENT_OTHER): Payer: Commercial Managed Care - PPO

## 2014-05-04 ENCOUNTER — Ambulatory Visit (HOSPITAL_BASED_OUTPATIENT_CLINIC_OR_DEPARTMENT_OTHER): Payer: Commercial Managed Care - PPO

## 2014-05-04 ENCOUNTER — Other Ambulatory Visit: Payer: Self-pay | Admitting: Hematology and Oncology

## 2014-05-04 ENCOUNTER — Other Ambulatory Visit: Payer: Self-pay | Admitting: *Deleted

## 2014-05-04 VITALS — BP 122/75 | HR 73 | Temp 97.6°F

## 2014-05-04 DIAGNOSIS — C8307 Small cell B-cell lymphoma, spleen: Secondary | ICD-10-CM

## 2014-05-04 DIAGNOSIS — Z5112 Encounter for antineoplastic immunotherapy: Secondary | ICD-10-CM

## 2014-05-04 DIAGNOSIS — Z5111 Encounter for antineoplastic chemotherapy: Secondary | ICD-10-CM

## 2014-05-04 LAB — LACTATE DEHYDROGENASE (CC13): LDH: 178 U/L (ref 125–245)

## 2014-05-04 LAB — COMPREHENSIVE METABOLIC PANEL (CC13)
ALT: 19 U/L (ref 0–55)
ANION GAP: 9 meq/L (ref 3–11)
AST: 15 U/L (ref 5–34)
Albumin: 3.8 g/dL (ref 3.5–5.0)
Alkaline Phosphatase: 64 U/L (ref 40–150)
BILIRUBIN TOTAL: 0.62 mg/dL (ref 0.20–1.20)
BUN: 15.8 mg/dL (ref 7.0–26.0)
CO2: 23 meq/L (ref 22–29)
Calcium: 9.4 mg/dL (ref 8.4–10.4)
Chloride: 105 mEq/L (ref 98–109)
Creatinine: 1 mg/dL (ref 0.6–1.1)
GLUCOSE: 94 mg/dL (ref 70–140)
Potassium: 4.3 mEq/L (ref 3.5–5.1)
Sodium: 137 mEq/L (ref 136–145)
Total Protein: 7.3 g/dL (ref 6.4–8.3)

## 2014-05-04 LAB — CBC & DIFF AND RETIC
BASO%: 0.4 % (ref 0.0–2.0)
Basophils Absolute: 0 10*3/uL (ref 0.0–0.1)
EOS%: 2.2 % (ref 0.0–7.0)
Eosinophils Absolute: 0.1 10*3/uL (ref 0.0–0.5)
HEMATOCRIT: 35.9 % (ref 34.8–46.6)
HGB: 11.7 g/dL (ref 11.6–15.9)
Immature Retic Fract: 3.6 % (ref 1.60–10.00)
LYMPH#: 0.6 10*3/uL — AB (ref 0.9–3.3)
LYMPH%: 23 % (ref 14.0–49.7)
MCH: 26.1 pg (ref 25.1–34.0)
MCHC: 32.6 g/dL (ref 31.5–36.0)
MCV: 80.1 fL (ref 79.5–101.0)
MONO#: 0.2 10*3/uL (ref 0.1–0.9)
MONO%: 8.6 % (ref 0.0–14.0)
NEUT#: 1.8 10*3/uL (ref 1.5–6.5)
NEUT%: 65.8 % (ref 38.4–76.8)
PLATELETS: 146 10*3/uL (ref 145–400)
RBC: 4.48 10*6/uL (ref 3.70–5.45)
RDW: 14.8 % — ABNORMAL HIGH (ref 11.2–14.5)
Retic %: 1.68 % (ref 0.70–2.10)
Retic Ct Abs: 75.26 10*3/uL (ref 33.70–90.70)
WBC: 2.8 10*3/uL — ABNORMAL LOW (ref 3.9–10.3)

## 2014-05-04 MED ORDER — DIPHENHYDRAMINE HCL 25 MG PO CAPS
ORAL_CAPSULE | ORAL | Status: AC
  Filled 2014-05-04: qty 2

## 2014-05-04 MED ORDER — ACETAMINOPHEN 325 MG PO TABS
ORAL_TABLET | ORAL | Status: AC
  Filled 2014-05-04: qty 2

## 2014-05-04 MED ORDER — ACETAMINOPHEN 325 MG PO TABS
650.0000 mg | ORAL_TABLET | Freq: Once | ORAL | Status: AC
  Administered 2014-05-04: 650 mg via ORAL

## 2014-05-04 MED ORDER — PROCHLORPERAZINE MALEATE 10 MG PO TABS
ORAL_TABLET | ORAL | Status: AC
  Filled 2014-05-04: qty 1

## 2014-05-04 MED ORDER — DIPHENHYDRAMINE HCL 25 MG PO CAPS
50.0000 mg | ORAL_CAPSULE | Freq: Once | ORAL | Status: AC
  Administered 2014-05-04: 50 mg via ORAL

## 2014-05-04 MED ORDER — SODIUM CHLORIDE 0.9 % IV SOLN
375.0000 mg/m2 | Freq: Once | INTRAVENOUS | Status: AC
  Administered 2014-05-04: 800 mg via INTRAVENOUS
  Filled 2014-05-04: qty 80

## 2014-05-04 MED ORDER — PROCHLORPERAZINE MALEATE 10 MG PO TABS
10.0000 mg | ORAL_TABLET | Freq: Once | ORAL | Status: AC
  Administered 2014-05-04: 10 mg via ORAL

## 2014-05-04 MED ORDER — CLADRIBINE CHEMO INJECTION 1 MG/ML
0.1000 mg/kg | INTRAVENOUS | Status: DC
  Administered 2014-05-04: 9 mg via INTRAVENOUS
  Filled 2014-05-04: qty 9

## 2014-05-04 MED ORDER — SODIUM CHLORIDE 0.9 % IV SOLN
Freq: Once | INTRAVENOUS | Status: AC
  Administered 2014-05-04: 11:00:00 via INTRAVENOUS

## 2014-05-04 NOTE — Patient Instructions (Signed)
[place] Discharge Instructions for Patients Receiving Chemotherapy  Today you received the following chemotherapy agents Rituxan/Cladribine.  To help prevent nausea and vomiting after your treatment, we encourage you to take your nausea medication as prescribed.   If you develop nausea and vomiting that is not controlled by your nausea medication, call the clinic.   BELOW ARE SYMPTOMS THAT SHOULD BE REPORTED IMMEDIATELY:  *FEVER GREATER THAN 100.5 F  *CHILLS WITH OR WITHOUT FEVER  NAUSEA AND VOMITING THAT IS NOT CONTROLLED WITH YOUR NAUSEA MEDICATION  *UNUSUAL SHORTNESS OF BREATH  *UNUSUAL BRUISING OR BLEEDING  TENDERNESS IN MOUTH AND THROAT WITH OR WITHOUT PRESENCE OF ULCERS  *URINARY PROBLEMS  *BOWEL PROBLEMS  UNUSUAL RASH Items with * indicate a potential emergency and should be followed up as soon as possible.  Feel free to call the clinic you have any questions or concerns. The clinic phone number is [phone].

## 2014-05-08 NOTE — Progress Notes (Signed)
FAXED [address], RN @ [facility] FAX # [phone] PHONE # [phone].

## 2014-05-10 ENCOUNTER — Other Ambulatory Visit: Payer: Self-pay | Admitting: Hematology and Oncology

## 2014-05-11 ENCOUNTER — Ambulatory Visit (HOSPITAL_BASED_OUTPATIENT_CLINIC_OR_DEPARTMENT_OTHER): Payer: Commercial Managed Care - PPO | Admitting: Hematology and Oncology

## 2014-05-11 ENCOUNTER — Other Ambulatory Visit (HOSPITAL_BASED_OUTPATIENT_CLINIC_OR_DEPARTMENT_OTHER): Payer: Commercial Managed Care - PPO

## 2014-05-11 ENCOUNTER — Encounter: Payer: Self-pay | Admitting: Hematology and Oncology

## 2014-05-11 ENCOUNTER — Ambulatory Visit (HOSPITAL_BASED_OUTPATIENT_CLINIC_OR_DEPARTMENT_OTHER): Payer: Commercial Managed Care - PPO

## 2014-05-11 ENCOUNTER — Telehealth: Payer: Self-pay | Admitting: Hematology and Oncology

## 2014-05-11 VITALS — BP 134/73 | HR 78 | Temp 98.4°F | Resp 18

## 2014-05-11 VITALS — BP 125/70 | HR 76 | Temp 97.8°F | Resp 20 | Ht 67.0 in | Wt 189.6 lb

## 2014-05-11 DIAGNOSIS — D696 Thrombocytopenia, unspecified: Secondary | ICD-10-CM

## 2014-05-11 DIAGNOSIS — C8307 Small cell B-cell lymphoma, spleen: Secondary | ICD-10-CM

## 2014-05-11 DIAGNOSIS — Z5112 Encounter for antineoplastic immunotherapy: Secondary | ICD-10-CM

## 2014-05-11 DIAGNOSIS — Z5111 Encounter for antineoplastic chemotherapy: Secondary | ICD-10-CM

## 2014-05-11 DIAGNOSIS — D630 Anemia in neoplastic disease: Secondary | ICD-10-CM

## 2014-05-11 DIAGNOSIS — K5901 Slow transit constipation: Secondary | ICD-10-CM

## 2014-05-11 DIAGNOSIS — D72819 Decreased white blood cell count, unspecified: Secondary | ICD-10-CM

## 2014-05-11 DIAGNOSIS — K5900 Constipation, unspecified: Secondary | ICD-10-CM

## 2014-05-11 HISTORY — DX: Constipation, unspecified: K59.00

## 2014-05-11 LAB — COMPREHENSIVE METABOLIC PANEL (CC13)
ALT: 15 U/L (ref 0–55)
AST: 16 U/L (ref 5–34)
Albumin: 3.6 g/dL (ref 3.5–5.0)
Alkaline Phosphatase: 57 U/L (ref 40–150)
Anion Gap: 8 mEq/L (ref 3–11)
BUN: 10.4 mg/dL (ref 7.0–26.0)
CO2: 26 mEq/L (ref 22–29)
Calcium: 9.4 mg/dL (ref 8.4–10.4)
Chloride: 104 mEq/L (ref 98–109)
Creatinine: 0.9 mg/dL (ref 0.6–1.1)
Glucose: 111 mg/dl (ref 70–140)
POTASSIUM: 4.2 meq/L (ref 3.5–5.1)
Sodium: 138 mEq/L (ref 136–145)
Total Bilirubin: 0.7 mg/dL (ref 0.20–1.20)
Total Protein: 7 g/dL (ref 6.4–8.3)

## 2014-05-11 LAB — CBC & DIFF AND RETIC
BASO%: 0.9 % (ref 0.0–2.0)
BASOS ABS: 0 10*3/uL (ref 0.0–0.1)
EOS%: 2.3 % (ref 0.0–7.0)
Eosinophils Absolute: 0.1 10*3/uL (ref 0.0–0.5)
HEMATOCRIT: 28.9 % — AB (ref 34.8–46.6)
HEMOGLOBIN: 9.2 g/dL — AB (ref 11.6–15.9)
IMMATURE RETIC FRACT: 15.5 % — AB (ref 1.60–10.00)
LYMPH#: 0.6 10*3/uL — AB (ref 0.9–3.3)
LYMPH%: 28.2 % (ref 14.0–49.7)
MCH: 26.4 pg (ref 25.1–34.0)
MCHC: 31.8 g/dL (ref 31.5–36.0)
MCV: 82.8 fL (ref 79.5–101.0)
MONO#: 0.1 10*3/uL (ref 0.1–0.9)
MONO%: 5.9 % (ref 0.0–14.0)
NEUT#: 1.4 10*3/uL — ABNORMAL LOW (ref 1.5–6.5)
NEUT%: 62.7 % (ref 38.4–76.8)
PLATELETS: 110 10*3/uL — AB (ref 145–400)
RBC: 3.49 10*6/uL — ABNORMAL LOW (ref 3.70–5.45)
RDW: 16.2 % — ABNORMAL HIGH (ref 11.2–14.5)
Retic %: 3.77 % — ABNORMAL HIGH (ref 0.70–2.10)
Retic Ct Abs: 131.57 10*3/uL — ABNORMAL HIGH (ref 33.70–90.70)
WBC: 2.2 10*3/uL — ABNORMAL LOW (ref 3.9–10.3)

## 2014-05-11 LAB — LACTATE DEHYDROGENASE (CC13): LDH: 234 U/L (ref 125–245)

## 2014-05-11 MED ORDER — DIPHENHYDRAMINE HCL 25 MG PO CAPS
ORAL_CAPSULE | ORAL | Status: AC
  Filled 2014-05-11: qty 2

## 2014-05-11 MED ORDER — SODIUM CHLORIDE 0.9 % IV SOLN
Freq: Once | INTRAVENOUS | Status: AC
  Administered 2014-05-11: 10:00:00 via INTRAVENOUS

## 2014-05-11 MED ORDER — SODIUM CHLORIDE 0.9 % IV SOLN
375.0000 mg/m2 | Freq: Once | INTRAVENOUS | Status: AC
  Administered 2014-05-11: 800 mg via INTRAVENOUS
  Filled 2014-05-11: qty 80

## 2014-05-11 MED ORDER — DIPHENHYDRAMINE HCL 25 MG PO CAPS
50.0000 mg | ORAL_CAPSULE | Freq: Once | ORAL | Status: AC
  Administered 2014-05-11: 50 mg via ORAL

## 2014-05-11 MED ORDER — PROCHLORPERAZINE MALEATE 10 MG PO TABS
10.0000 mg | ORAL_TABLET | Freq: Once | ORAL | Status: AC
  Administered 2014-05-11: 10 mg via ORAL

## 2014-05-11 MED ORDER — ACETAMINOPHEN 325 MG PO TABS
650.0000 mg | ORAL_TABLET | Freq: Once | ORAL | Status: AC
  Administered 2014-05-11: 650 mg via ORAL

## 2014-05-11 MED ORDER — ACETAMINOPHEN 325 MG PO TABS
ORAL_TABLET | ORAL | Status: AC
  Filled 2014-05-11: qty 2

## 2014-05-11 MED ORDER — PROCHLORPERAZINE MALEATE 10 MG PO TABS
ORAL_TABLET | ORAL | Status: AC
  Filled 2014-05-11: qty 1

## 2014-05-11 MED ORDER — CLADRIBINE CHEMO INJECTION 1 MG/ML
0.1000 mg/kg | INTRAVENOUS | Status: DC
  Administered 2014-05-11: 9 mg via INTRAVENOUS
  Filled 2014-05-11: qty 9

## 2014-05-11 MED ORDER — DOCUSATE SODIUM 100 MG PO CAPS: 100.0000 mg | ORAL_CAPSULE | Freq: Two times a day (BID) | ORAL | Status: DC

## 2014-05-11 NOTE — Progress Notes (Signed)
[facility] OFFICE PROGRESS NOTE  Patient Care Team: Sofie Hartigan, MD as PCP - General (Family Medicine)  SUMMARY OF ONCOLOGIC HISTORY:   Lymphoma, marginal zone, spleen   03/23/2014 Bone Marrow Biopsy Bone marrow is involved   04/19/2014 Imaging PET CT scan showed lymphadenopathy and splenomegaly   04/27/2014 -  Chemotherapy She received weekly rituximab and cladribine    INTERVAL HISTORY: Please see below for problem oriented charting. She is seen prior to cycle 3 of treatment. She has no side effects from recent treatment apart from some mild fatigue. She felt that her spleen has reduced in size. Denies recent fevers or chills. She has mild constipation, resolved with laxatives. REVIEW OF SYSTEMS:   Constitutional: Denies fevers, chills or abnormal weight loss Eyes: Denies blurriness of vision Ears, nose, mouth, throat, and face: Denies mucositis or sore throat Respiratory: Denies cough, dyspnea or wheezes Cardiovascular: Denies palpitation, chest discomfort or lower extremity swelling Skin: Denies abnormal skin rashes Lymphatics: Denies new lymphadenopathy or easy bruising Neurological:Denies numbness, tingling or new weaknesses Behavioral/Psych: Mood is stable, no new changes  All other systems were reviewed with the patient and are negative.  I have reviewed the past medical history, past surgical history, social history and family history with the patient and they are unchanged from previous note.  ALLERGIES:  has No Known Allergies.  MEDICATIONS:  Current Outpatient Prescriptions  Medication Sig Dispense Refill  . acyclovir (ZOVIRAX) 400 MG tablet Take 1 tablet (400 mg total) by mouth 2 (two) times daily.  60 tablet  2  . Cholecalciferol (VITAMIN D) 2000 UNITS tablet Take 2,000 Units by mouth daily.      . fluconazole (DIFLUCAN) 100 MG tablet Take 1 tablet (100 mg total) by mouth daily.  30 tablet  2  . ibuprofen (ADVIL,MOTRIN) 400 MG tablet Take 400  mg by mouth every 6 (six) hours as needed.      . iron polysaccharides (NIFEREX) 150 MG capsule Take 1 capsule (150 mg total) by mouth 2 (two) times daily.  120 capsule  11  . ondansetron (ZOFRAN-ODT) 8 MG disintegrating tablet Take 1 tablet (8 mg total) by mouth every 8 (eight) hours as needed for nausea or vomiting.  20 tablet  3  . predniSONE (DELTASONE) 20 MG tablet Take 3 tablets (60 mg total) by mouth daily with breakfast.  6 tablet  0  . Probiotic Product (PROBIOTIC DAILY PO) Take by mouth daily.      Marland Kitchen sulfamethoxazole-trimethoprim (BACTRIM DS) 800-160 MG per tablet Take 1 tablet by mouth 3 (three) times a week.  24 tablet  1  . docusate sodium (COLACE) 100 MG capsule Take 1 capsule (100 mg total) by mouth 2 (two) times daily.  100 capsule  3   No current facility-administered medications for this visit.   Facility-Administered Medications Ordered in Other Visits  Medication Dose Route Frequency Provider Last Rate Last Dose  . 0.9 %  sodium chloride infusion   Intravenous Once Heath Lark, MD      . cladribine (LEUSTATIN) 9 mg in sodium chloride 0.9 % 250 mL chemo infusion  0.1 mg/kg (Treatment Plan Actual) Intravenous Weekly Heath Lark, MD      . prochlorperazine (COMPAZINE) tablet 10 mg  10 mg Oral Once Heath Lark, MD      . riTUXimab (RITUXAN) 800 mg in sodium chloride 0.9 % 170 mL chemo infusion  375 mg/m2 (Treatment Plan Actual) Intravenous Once Heath Lark, MD  PHYSICAL EXAMINATION: ECOG PERFORMANCE STATUS: 0 - Asymptomatic  Filed Vitals:   05/11/14 0912  BP: 125/70  Pulse: 76  Temp: 97.8 F (36.6 C)  Resp: 20   Filed Weights   05/11/14 0912  Weight: 189 lb 9.6 oz (86.002 kg)    GENERAL:alert, no distress and comfortable SKIN: skin color, texture, turgor are normal, no rashes or significant lesions EYES: normal, Conjunctiva are pink and non-injected, sclera clear OROPHARYNX:no exudate, no erythema and lips, buccal mucosa, and tongue normal  NECK: supple,  thyroid normal size, non-tender, without nodularity LYMPH:  no palpable lymphadenopathy in the cervical, axillary or inguinal LUNGS: clear to auscultation and percussion with normal breathing effort HEART: regular rate & rhythm and no murmurs and no lower extremity edema ABDOMEN:abdomen soft, non-tender and normal bowel sounds. Her spleen has reduced in size. Musculoskeletal:no cyanosis of digits and no clubbing  NEURO: alert & oriented x 3 with fluent speech, no focal motor/sensory deficits  LABORATORY DATA:  I have reviewed the data as listed    Component Value Date/Time   NA 138 05/11/2014 0850   NA 137 04/11/2014 1059   K 4.2 05/11/2014 0850   K 4.2 04/11/2014 1059   CL 102 04/11/2014 1059   CO2 26 05/11/2014 0850   CO2 28 04/11/2014 1059   GLUCOSE 111 05/11/2014 0850   GLUCOSE 95 04/11/2014 1059   BUN 10.4 05/11/2014 0850   BUN 14 04/11/2014 1059   CREATININE 0.9 05/11/2014 0850   CREATININE 0.85 04/11/2014 1059   CREATININE 0.99 01/20/2012 1452   CREATININE 0.91 02/06/2011 0856   CALCIUM 9.4 05/11/2014 0850   CALCIUM 9.1 04/11/2014 1059   PROT 7.0 05/11/2014 0850   PROT 6.7 04/11/2014 1059   ALBUMIN 3.6 05/11/2014 0850   ALBUMIN 4.1 04/11/2014 1059   AST 16 05/11/2014 0850   AST 15 04/11/2014 1059   ALT 15 05/11/2014 0850   ALT 14 04/11/2014 1059   ALKPHOS 57 05/11/2014 0850   ALKPHOS 65 04/11/2014 1059   BILITOT 0.70 05/11/2014 0850   BILITOT 0.7 04/11/2014 1059   GFRNONAA 76 02/14/2014 1105   GFRAA 88 02/14/2014 1105    No results found for this basename: SPEP, UPEP,  kappa and lambda light chains    Lab Results  Component Value Date   WBC 2.2* 05/11/2014   NEUTROABS 1.4* 05/11/2014   HGB 9.2* 05/11/2014   HCT 28.9* 05/11/2014   MCV 82.8 05/11/2014   PLT 110* 05/11/2014      Chemistry      Component Value Date/Time   NA 138 05/11/2014 0850   NA 137 04/11/2014 1059   K 4.2 05/11/2014 0850   K 4.2 04/11/2014 1059   CL 102 04/11/2014 1059   CO2 26 05/11/2014 0850   CO2 28 04/11/2014 1059    BUN 10.4 05/11/2014 0850   BUN 14 04/11/2014 1059   CREATININE 0.9 05/11/2014 0850   CREATININE 0.85 04/11/2014 1059   CREATININE 0.99 01/20/2012 1452   CREATININE 0.91 02/06/2011 0856      Component Value Date/Time   CALCIUM 9.4 05/11/2014 0850   CALCIUM 9.1 04/11/2014 1059   ALKPHOS 57 05/11/2014 0850   ALKPHOS 65 04/11/2014 1059   AST 16 05/11/2014 0850   AST 15 04/11/2014 1059   ALT 15 05/11/2014 0850   ALT 14 04/11/2014 1059   BILITOT 0.70 05/11/2014 0850   BILITOT 0.7 04/11/2014 1059    ASSESSMENT & PLAN:   Lymphoma, marginal zone, spleen Overall, she tolerated chemotherapy  well apart from the first cycle of treatment when she had slight nausea. I will continue treatment without dosage adjustment.  Thrombocytopenia, unspecified This is likely due to recent treatment. The patient denies recent history of bleeding such as epistaxis, hematuria or hematochezia. She is asymptomatic from the low platelet count. I will observe for now.  she does not require transfusion now. I will continue the chemotherapy at current dose without dosage adjustment.  If the thrombocytopenia gets progressive worse in the future, I might have to delay her treatment or adjust the chemotherapy dose.    Leukopenia This is likely due to recent treatment. The patient denies recent history of fevers, cough, chills, diarrhea or dysuria. She is asymptomatic from the leukopenia. I will observe for now.  I will continue the chemotherapy at current dose without dosage adjustment.  If the leukopenia gets progressive worse in the future, I might have to delay her treatment or adjust the chemotherapy dose.  We discussed neutropenic precautions.  Anemia in neoplastic disease This is likely due to recent treatment. The patient denies recent history of bleeding such as epistaxis, hematuria or hematochezia. She is asymptomatic from the anemia. I will observe for now.  She does not require transfusion now. I will continue the  chemotherapy at current dose without dosage adjustment.  If the anemia gets progressive worse in the future, I might have to delay her treatment or adjust the chemotherapy dose.   Constipation I will prescribe colace     Orders Placed This Encounter  Procedures  . IgG, IgA, IgM    Standing Status: Future     Number of Occurrences:      Standing Expiration Date: 06/15/2015   All questions were answered. The patient knows to call the clinic with any problems, questions or concerns. No barriers to learning was detected. I spent 25 minutes counseling the patient face to face. The total time spent in the appointment was 30 minutes and more than 50% was on counseling and review of test results     [facility], [place], MD 05/11/2014 10:49 AM

## 2014-05-11 NOTE — Assessment & Plan Note (Signed)

## 2014-05-11 NOTE — Assessment & Plan Note (Addendum)
This is likely due to recent treatment. The patient denies recent history of fevers, cough, chills, diarrhea or dysuria. She is asymptomatic from the leukopenia. I will observe for now.  I will continue the chemotherapy at current dose without dosage adjustment.  If the leukopenia gets progressive worse in the future, I might have to delay her treatment or adjust the chemotherapy dose. We discussed neutropenic precautions   

## 2014-05-11 NOTE — Patient Instructions (Signed)
Rituximab injection What is this medicine? RITUXIMAB (ri TUX i mab) is a monoclonal antibody. This medicine changes the way the body's immune system works. It is used commonly to treat non-Hodgkin's lymphoma and other conditions. In cancer cells, this drug targets a specific protein within cancer cells and stops the cancer cells from growing. It is also used to treat rhuematoid arthritis (RA). In RA, this medicine slow the inflammatory process and help reduce joint pain and swelling. This medicine is often used with other cancer or arthritis medications. This medicine may be used for other purposes; ask your health care provider or pharmacist if you have questions. COMMON BRAND NAME(S): Rituxan What should I tell my health care provider before I take this medicine? They need to know if you have any of these conditions: -blood disorders -heart disease -history of hepatitis B -infection (especially a virus infection such as chickenpox, cold sores, or herpes) -irregular heartbeat -kidney disease -lung or breathing disease, like asthma -lupus -an unusual or allergic reaction to rituximab, mouse proteins, other medicines, foods, dyes, or preservatives -pregnant or trying to get pregnant -breast-feeding How should I use this medicine? This medicine is for infusion into a vein. It is administered in a hospital or clinic by a specially trained health care professional. A special MedGuide will be given to you by the pharmacist with each prescription and refill. Be sure to read this information carefully each time. Talk to your pediatrician regarding the use of this medicine in children. This medicine is not approved for use in children. Overdosage: If you think you have taken too much of this medicine contact a poison control center or emergency room at once. NOTE: This medicine is only for you. Do not share this medicine with others. What if I miss a dose? It is important not to miss a dose. Call  your doctor or health care professional if you are unable to keep an appointment. What may interact with this medicine? -cisplatin -medicines for blood pressure -some other medicines for arthritis -vaccines This list may not describe all possible interactions. Give your health care provider a list of all the medicines, herbs, non-prescription drugs, or dietary supplements you use. Also tell them if you smoke, drink alcohol, or use illegal drugs. Some items may interact with your medicine. What should I watch for while using this medicine? Report any side effects that you notice during your treatment right away, such as changes in your breathing, fever, chills, dizziness or lightheadedness. These effects are more common with the first dose. Visit your prescriber or health care professional for checks on your progress. You will need to have regular blood work. Report any other side effects. The side effects of this medicine can continue after you finish your treatment. Continue your course of treatment even though you feel ill unless your doctor tells you to stop. Call your doctor or health care professional for advice if you get a fever, chills or sore throat, or other symptoms of a cold or flu. Do not treat yourself. This drug decreases your body's ability to fight infections. Try to avoid being around people who are sick. This medicine may increase your risk to bruise or bleed. Call your doctor or health care professional if you notice any unusual bleeding. Be careful brushing and flossing your teeth or using a toothpick because you may get an infection or bleed more easily. If you have any dental work done, tell your dentist you are receiving this medicine. Avoid taking products   that contain aspirin, acetaminophen, ibuprofen, naproxen, or ketoprofen unless instructed by your doctor. These medicines may hide a fever. Do not become pregnant while taking this medicine. Women should inform their doctor  if they wish to become pregnant or think they might be pregnant. There is a potential for serious side effects to an unborn child. Talk to your health care professional or pharmacist for more information. Do not breast-feed an infant while taking this medicine. What side effects may I notice from receiving this medicine? Side effects that you should report to your doctor or health care professional as soon as possible: -allergic reactions like skin rash, itching or hives, swelling of the face, lips, or tongue -low blood counts - this medicine may decrease the number of white blood cells, red blood cells and platelets. You may be at increased risk for infections and bleeding. -signs of infection - fever or chills, cough, sore throat, pain or difficulty passing urine -signs of decreased platelets or bleeding - bruising, pinpoint red spots on the skin, black, tarry stools, blood in the urine -signs of decreased red blood cells - unusually weak or tired, fainting spells, lightheadedness -breathing problems -confused, not responsive -chest pain -fast, irregular heartbeat -feeling faint or lightheaded, falls -mouth sores -redness, blistering, peeling or loosening of the skin, including inside the mouth -stomach pain -swelling of the ankles, feet, or hands -trouble passing urine or change in the amount of urine Side effects that usually do not require medical attention (report to your doctor or other health care professional if they continue or are bothersome): -anxiety -headache -loss of appetite -muscle aches -nausea -night sweats This list may not describe all possible side effects. Call your doctor for medical advice about side effects. You may report side effects to FDA at 1-800-FDA-1088. Where should I keep my medicine? This drug is given in a hospital or clinic and will not be stored at home. NOTE: This sheet is a summary. It may not cover all possible information. If you have questions  about this medicine, talk to your doctor, pharmacist, or health care provider.  2015, Elsevier/Gold Standard. (2008-05-29 14:04:59) Cladribine injection for infusion What is this medicine? CLADRIBINE (KLA dri been) is a chemotherapy drug. This medicine reduces the growth of cancer cells and can suppress the immune system. It is used for treating leukemias. This medicine may be used for other purposes; ask your health care provider or pharmacist if you have questions. COMMON BRAND NAME(S): Leustatin What should I tell my health care provider before I take this medicine? They need to know if you have any of these conditions: -bleeding problems -infection (especially a virus infection such as chickenpox, cold sores, or herpes) -kidney disease -liver disease -an unusual or allergic reaction to cladribine, benzyl alcohol, other medicines, foods, dyes, or preservatives -pregnant or trying to get pregnant -breast-feeding How should I use this medicine? This drug is given as an infusion into a vein. It is administered in a hospital or clinic by a specially trained health care professional. Talk to your pediatrician regarding the use of this medicine in children. While this drug may be prescribed for children for selected conditions, precautions do apply. Overdosage: If you think you have taken too much of this medicine contact a poison control center or emergency room at once. NOTE: This medicine is only for you. Do not share this medicine with others. What if I miss a dose? It is important not to miss a dose. Call your doctor or health   care professional if you are unable to keep an appointment. What may interact with this medicine? -vaccines Talk to your doctor or health care professional before taking any of these medicines: -acetaminophen -aspirin -ibuprofen -naproxen -ketoprofen This list may not describe all possible interactions. Give your health care provider a list of all the  medicines, herbs, non-prescription drugs, or dietary supplements you use. Also tell them if you smoke, drink alcohol, or use illegal drugs. Some items may interact with your medicine. What should I watch for while using this medicine? This drug may make you feel generally unwell. This is not uncommon, as chemotherapy can affect healthy cells as well as cancer cells. Report any side effects. Continue your course of treatment even though you feel ill unless your doctor tells you to stop. In some cases, you may be given additional medicines to help with side effects. Follow all directions for their use. Call your doctor or health care professional for advice if you get a fever, chills or sore throat, or other symptoms of a cold or flu. Do not treat yourself. This drug decreases your body's ability to fight infections. Try to avoid being around people who are sick. This medicine may increase your risk to bruise or bleed. Call your doctor or health care professional if you notice any unusual bleeding. Be careful brushing and flossing your teeth or using a toothpick because you may get an infection or bleed more easily. If you have any dental work done, tell your dentist you are receiving this medicine. Avoid taking products that contain aspirin, acetaminophen, ibuprofen, naproxen, or ketoprofen unless instructed by your doctor. These medicines may hide a fever. Do not become pregnant while taking this medicine. Women should inform their doctor if they wish to become pregnant or think they might be pregnant. There is a potential for serious side effects to an unborn child. Talk to your health care professional or pharmacist for more information. Do not breast-feed an infant while taking this medicine. If you are a man, you should not father a child while receiving treatment. What side effects may I notice from receiving this medicine? Side effects that you should report to your doctor or health care  professional as soon as possible: -allergic reactions like skin rash, itching or hives, swelling of the face, lips, or tongue -low blood counts - This drug may decrease the number of white blood cells, red blood cells and platelets. You may be at increased risk for infections and bleeding. -signs of infection - fever or chills, cough, sore throat, pain or difficulty passing urine -signs of decreased platelets or bleeding - bruising, pinpoint red spots on the skin, black, tarry stools, nosebleeds -signs of decreased red blood cells - unusually weak or tired, fainting spells, lightheadedness -abdominal pain -breathing problems -dizziness -mouth sores -trouble passing urine or change in the amount of urine Side effects that usually do not require medical attention (report to your doctor or health care professional if they continue or are bothersome): -diarrhea -headache -loss of appetite -nausea, vomiting -pain or redness at the injection site -weak or tired This list may not describe all possible side effects. Call your doctor for medical advice about side effects. You may report side effects to FDA at 1-800-FDA-1088. Where should I keep my medicine? This drug is given in a hospital or clinic and will not be stored at home. NOTE: This sheet is a summary. It may not cover all possible information. If you have questions about this   medicine, talk to your doctor, pharmacist, or health care provider.  2015, Elsevier/Gold Standard. (2008-01-04 14:42:56)  

## 2014-05-11 NOTE — Telephone Encounter (Signed)
gv and printed appt sched and avs for pt for Aug....sed added tx. °

## 2014-05-11 NOTE — Assessment & Plan Note (Signed)
Overall, she tolerated chemotherapy well apart from the first cycle of treatment when she had slight nausea. I will continue treatment without dosage adjustment.

## 2014-05-11 NOTE — Progress Notes (Signed)
Ok to treat today with low ANC per Dr. Alvy Bimler.

## 2014-05-11 NOTE — Assessment & Plan Note (Signed)
I will prescribe colace

## 2014-05-11 NOTE — Assessment & Plan Note (Signed)
This is likely due to recent treatment. The patient denies recent history of bleeding such as epistaxis, hematuria or hematochezia. She is asymptomatic from the low platelet count. I will observe for now.  she does not require transfusion now. I will continue the chemotherapy at current dose without dosage adjustment.  If the thrombocytopenia gets progressive worse in the future, I might have to delay her treatment or adjust the chemotherapy dose.   

## 2014-05-18 ENCOUNTER — Other Ambulatory Visit: Payer: Self-pay | Admitting: Hematology and Oncology

## 2014-05-18 ENCOUNTER — Other Ambulatory Visit (HOSPITAL_BASED_OUTPATIENT_CLINIC_OR_DEPARTMENT_OTHER): Payer: Commercial Managed Care - PPO

## 2014-05-18 ENCOUNTER — Ambulatory Visit (HOSPITAL_BASED_OUTPATIENT_CLINIC_OR_DEPARTMENT_OTHER): Payer: Commercial Managed Care - PPO

## 2014-05-18 VITALS — BP 127/68 | HR 65 | Temp 97.9°F | Resp 18

## 2014-05-18 DIAGNOSIS — D630 Anemia in neoplastic disease: Secondary | ICD-10-CM

## 2014-05-18 DIAGNOSIS — C8307 Small cell B-cell lymphoma, spleen: Secondary | ICD-10-CM

## 2014-05-18 DIAGNOSIS — D72819 Decreased white blood cell count, unspecified: Secondary | ICD-10-CM

## 2014-05-18 DIAGNOSIS — Z5111 Encounter for antineoplastic chemotherapy: Secondary | ICD-10-CM

## 2014-05-18 DIAGNOSIS — Z5112 Encounter for antineoplastic immunotherapy: Secondary | ICD-10-CM

## 2014-05-18 DIAGNOSIS — D696 Thrombocytopenia, unspecified: Secondary | ICD-10-CM

## 2014-05-18 LAB — COMPREHENSIVE METABOLIC PANEL (CC13)
ALK PHOS: 52 U/L (ref 40–150)
ALT: 16 U/L (ref 0–55)
AST: 20 U/L (ref 5–34)
Albumin: 3.9 g/dL (ref 3.5–5.0)
Anion Gap: 13 mEq/L — ABNORMAL HIGH (ref 3–11)
BUN: 13.3 mg/dL (ref 7.0–26.0)
CO2: 21 mEq/L — ABNORMAL LOW (ref 22–29)
CREATININE: 1 mg/dL (ref 0.6–1.1)
Calcium: 9.2 mg/dL (ref 8.4–10.4)
Chloride: 104 mEq/L (ref 98–109)
Glucose: 88 mg/dl (ref 70–140)
Potassium: 4.1 mEq/L (ref 3.5–5.1)
Sodium: 138 mEq/L (ref 136–145)
Total Bilirubin: 0.64 mg/dL (ref 0.20–1.20)
Total Protein: 7.5 g/dL (ref 6.4–8.3)

## 2014-05-18 LAB — CBC & DIFF AND RETIC
BASO%: 0.8 % (ref 0.0–2.0)
Basophils Absolute: 0 10*3/uL (ref 0.0–0.1)
EOS ABS: 0.1 10*3/uL (ref 0.0–0.5)
EOS%: 3.4 % (ref 0.0–7.0)
HEMATOCRIT: 30.7 % — AB (ref 34.8–46.6)
HGB: 10.3 g/dL — ABNORMAL LOW (ref 11.6–15.9)
IMMATURE RETIC FRACT: 6.8 % (ref 1.60–10.00)
LYMPH#: 0.7 10*3/uL — AB (ref 0.9–3.3)
LYMPH%: 26.8 % (ref 14.0–49.7)
MCH: 27.7 pg (ref 25.1–34.0)
MCHC: 33.6 g/dL (ref 31.5–36.0)
MCV: 82.5 fL (ref 79.5–101.0)
MONO#: 0.3 10*3/uL (ref 0.1–0.9)
MONO%: 10.7 % (ref 0.0–14.0)
NEUT%: 58.3 % (ref 38.4–76.8)
NEUTROS ABS: 1.5 10*3/uL (ref 1.5–6.5)
NRBC: 0 % (ref 0–0)
Platelets: 149 10*3/uL (ref 145–400)
RBC: 3.72 10*6/uL (ref 3.70–5.45)
RDW: 18.1 % — ABNORMAL HIGH (ref 11.2–14.5)
RETIC CT ABS: 165.91 10*3/uL — AB (ref 33.70–90.70)
Retic %: 4.46 % — ABNORMAL HIGH (ref 0.70–2.10)
WBC: 2.6 10*3/uL — ABNORMAL LOW (ref 3.9–10.3)

## 2014-05-18 LAB — LACTATE DEHYDROGENASE (CC13): LDH: 188 U/L (ref 125–245)

## 2014-05-18 MED ORDER — DIPHENHYDRAMINE HCL 25 MG PO CAPS
ORAL_CAPSULE | ORAL | Status: AC
  Filled 2014-05-18: qty 2

## 2014-05-18 MED ORDER — DIPHENHYDRAMINE HCL 25 MG PO CAPS
50.0000 mg | ORAL_CAPSULE | Freq: Once | ORAL | Status: AC
  Administered 2014-05-18: 50 mg via ORAL

## 2014-05-18 MED ORDER — PROCHLORPERAZINE MALEATE 10 MG PO TABS
10.0000 mg | ORAL_TABLET | Freq: Once | ORAL | Status: AC
  Administered 2014-05-18: 10 mg via ORAL

## 2014-05-18 MED ORDER — SODIUM CHLORIDE 0.9 % IV SOLN
375.0000 mg/m2 | Freq: Once | INTRAVENOUS | Status: AC
  Administered 2014-05-18: 800 mg via INTRAVENOUS
  Filled 2014-05-18: qty 80

## 2014-05-18 MED ORDER — ACETAMINOPHEN 325 MG PO TABS
ORAL_TABLET | ORAL | Status: AC
  Filled 2014-05-18: qty 2

## 2014-05-18 MED ORDER — PROCHLORPERAZINE MALEATE 10 MG PO TABS
ORAL_TABLET | ORAL | Status: AC
  Filled 2014-05-18: qty 1

## 2014-05-18 MED ORDER — ACETAMINOPHEN 325 MG PO TABS
650.0000 mg | ORAL_TABLET | Freq: Once | ORAL | Status: AC
  Administered 2014-05-18: 650 mg via ORAL

## 2014-05-18 MED ORDER — SODIUM CHLORIDE 0.9 % IV SOLN
0.1000 mg/kg | INTRAVENOUS | Status: DC
  Administered 2014-05-18: 9 mg via INTRAVENOUS
  Filled 2014-05-18: qty 9

## 2014-05-18 MED ORDER — SODIUM CHLORIDE 0.9 % IV SOLN
Freq: Once | INTRAVENOUS | Status: AC
  Administered 2014-05-18: 10:00:00 via INTRAVENOUS

## 2014-05-18 NOTE — Progress Notes (Signed)
Patient states she feels really good. OK to procede to treat today with rituxan and cladribine with CBC results from today and without CMET results phone order - Dr. Natale Lay.

## 2014-05-18 NOTE — Patient Instructions (Signed)
[facility] Discharge Instructions for Patients Receiving Chemotherapy  Today you received the following chemotherapy agents Rituxan/Cladribine.  To help prevent nausea and vomiting after your treatment, we encourage you to take your nausea medication as prescribed.   If you develop nausea and vomiting that is not controlled by your nausea medication, call the clinic.   BELOW ARE SYMPTOMS THAT SHOULD BE REPORTED IMMEDIATELY:  *FEVER GREATER THAN 100.5 F  *CHILLS WITH OR WITHOUT FEVER  NAUSEA AND VOMITING THAT IS NOT CONTROLLED WITH YOUR NAUSEA MEDICATION  *UNUSUAL SHORTNESS OF BREATH  *UNUSUAL BRUISING OR BLEEDING  TENDERNESS IN MOUTH AND THROAT WITH OR WITHOUT PRESENCE OF ULCERS  *URINARY PROBLEMS  *BOWEL PROBLEMS  UNUSUAL RASH Items with * indicate a potential emergency and should be followed up as soon as possible.  Feel free to call the clinic you have any questions or concerns. The clinic phone number is [phone].

## 2014-05-19 ENCOUNTER — Ambulatory Visit: Payer: Commercial Managed Care - PPO | Admitting: Hematology and Oncology

## 2014-05-19 ENCOUNTER — Ambulatory Visit: Payer: Commercial Managed Care - PPO

## 2014-05-19 LAB — IGG, IGA, IGM
IGA: 204 mg/dL (ref 69–380)
IGM, SERUM: 1060 mg/dL — AB (ref 52–322)
IgG (Immunoglobin G), Serum: 768 mg/dL (ref 690–1700)

## 2014-05-25 ENCOUNTER — Telehealth: Payer: Self-pay | Admitting: *Deleted

## 2014-05-25 ENCOUNTER — Ambulatory Visit: Payer: Commercial Managed Care - PPO

## 2014-05-25 ENCOUNTER — Ambulatory Visit (HOSPITAL_BASED_OUTPATIENT_CLINIC_OR_DEPARTMENT_OTHER): Payer: Commercial Managed Care - PPO

## 2014-05-25 ENCOUNTER — Other Ambulatory Visit (HOSPITAL_BASED_OUTPATIENT_CLINIC_OR_DEPARTMENT_OTHER): Payer: Commercial Managed Care - PPO

## 2014-05-25 VITALS — BP 133/70 | HR 67 | Temp 98.3°F | Resp 16

## 2014-05-25 DIAGNOSIS — Z5112 Encounter for antineoplastic immunotherapy: Secondary | ICD-10-CM

## 2014-05-25 DIAGNOSIS — Z5111 Encounter for antineoplastic chemotherapy: Secondary | ICD-10-CM

## 2014-05-25 DIAGNOSIS — C8307 Small cell B-cell lymphoma, spleen: Secondary | ICD-10-CM

## 2014-05-25 LAB — COMPREHENSIVE METABOLIC PANEL (CC13)
ALK PHOS: 53 U/L (ref 40–150)
ALT: 11 U/L (ref 0–55)
AST: 13 U/L (ref 5–34)
Albumin: 3.8 g/dL (ref 3.5–5.0)
Anion Gap: 9 mEq/L (ref 3–11)
BUN: 11.8 mg/dL (ref 7.0–26.0)
CALCIUM: 9.1 mg/dL (ref 8.4–10.4)
CHLORIDE: 108 meq/L (ref 98–109)
CO2: 22 mEq/L (ref 22–29)
Creatinine: 0.9 mg/dL (ref 0.6–1.1)
Glucose: 125 mg/dl (ref 70–140)
POTASSIUM: 3.7 meq/L (ref 3.5–5.1)
Sodium: 139 mEq/L (ref 136–145)
Total Bilirubin: 0.45 mg/dL (ref 0.20–1.20)
Total Protein: 7.1 g/dL (ref 6.4–8.3)

## 2014-05-25 LAB — CBC & DIFF AND RETIC
BASO%: 0.4 % (ref 0.0–2.0)
BASOS ABS: 0 10*3/uL (ref 0.0–0.1)
EOS ABS: 0.1 10*3/uL (ref 0.0–0.5)
EOS%: 1.9 % (ref 0.0–7.0)
HEMATOCRIT: 33 % — AB (ref 34.8–46.6)
HGB: 10.6 g/dL — ABNORMAL LOW (ref 11.6–15.9)
IMMATURE RETIC FRACT: 3.8 % (ref 1.60–10.00)
LYMPH#: 0.5 10*3/uL — AB (ref 0.9–3.3)
LYMPH%: 18.1 % (ref 14.0–49.7)
MCH: 26.8 pg (ref 25.1–34.0)
MCHC: 32.1 g/dL (ref 31.5–36.0)
MCV: 83.3 fL (ref 79.5–101.0)
MONO#: 0.2 10*3/uL (ref 0.1–0.9)
MONO%: 6.9 % (ref 0.0–14.0)
NEUT%: 72.7 % (ref 38.4–76.8)
NEUTROS ABS: 1.9 10*3/uL (ref 1.5–6.5)
Platelets: 135 10*3/uL — ABNORMAL LOW (ref 145–400)
RBC: 3.96 10*6/uL (ref 3.70–5.45)
RDW: 17.4 % — ABNORMAL HIGH (ref 11.2–14.5)
RETIC CT ABS: 119.59 10*3/uL — AB (ref 33.70–90.70)
Retic %: 3.02 % — ABNORMAL HIGH (ref 0.70–2.10)
WBC: 2.6 10*3/uL — AB (ref 3.9–10.3)

## 2014-05-25 LAB — LACTATE DEHYDROGENASE (CC13): LDH: 163 U/L (ref 125–245)

## 2014-05-25 MED ORDER — DIPHENHYDRAMINE HCL 25 MG PO CAPS
ORAL_CAPSULE | ORAL | Status: AC
  Filled 2014-05-25: qty 2

## 2014-05-25 MED ORDER — SODIUM CHLORIDE 0.9 % IV SOLN
Freq: Once | INTRAVENOUS | Status: AC
  Administered 2014-05-25: 10:00:00 via INTRAVENOUS

## 2014-05-25 MED ORDER — DIPHENHYDRAMINE HCL 25 MG PO CAPS
50.0000 mg | ORAL_CAPSULE | Freq: Once | ORAL | Status: AC
  Administered 2014-05-25: 50 mg via ORAL

## 2014-05-25 MED ORDER — SODIUM CHLORIDE 0.9 % IV SOLN
375.0000 mg/m2 | Freq: Once | INTRAVENOUS | Status: AC
  Administered 2014-05-25: 800 mg via INTRAVENOUS
  Filled 2014-05-25: qty 80

## 2014-05-25 MED ORDER — PROCHLORPERAZINE MALEATE 10 MG PO TABS
10.0000 mg | ORAL_TABLET | Freq: Once | ORAL | Status: AC
  Administered 2014-05-25: 10 mg via ORAL

## 2014-05-25 MED ORDER — PROCHLORPERAZINE MALEATE 10 MG PO TABS
ORAL_TABLET | ORAL | Status: AC
  Filled 2014-05-25: qty 1

## 2014-05-25 MED ORDER — ACETAMINOPHEN 325 MG PO TABS
ORAL_TABLET | ORAL | Status: AC
  Filled 2014-05-25: qty 2

## 2014-05-25 MED ORDER — ACETAMINOPHEN 325 MG PO TABS
650.0000 mg | ORAL_TABLET | Freq: Once | ORAL | Status: AC
  Administered 2014-05-25: 650 mg via ORAL

## 2014-05-25 MED ORDER — SODIUM CHLORIDE 0.9 % IV SOLN
0.1000 mg/kg | INTRAVENOUS | Status: DC
  Administered 2014-05-25: 9 mg via INTRAVENOUS
  Filled 2014-05-25: qty 9

## 2014-05-25 NOTE — Telephone Encounter (Signed)
No answer.  Left message and [facility] contact information if needed.

## 2014-05-25 NOTE — Patient Instructions (Signed)
[facility] Discharge Instructions for Patients Receiving Chemotherapy  Today you received the following chemotherapy agents Rituxan and Leustatin.  To help prevent nausea and vomiting after your treatment, we encourage you to take your nausea medication as prescribed.   If you develop nausea and vomiting that is not controlled by your nausea medication, call the clinic.   BELOW ARE SYMPTOMS THAT SHOULD BE REPORTED IMMEDIATELY:  *FEVER GREATER THAN 100.5 F  *CHILLS WITH OR WITHOUT FEVER  NAUSEA AND VOMITING THAT IS NOT CONTROLLED WITH YOUR NAUSEA MEDICATION  *UNUSUAL SHORTNESS OF BREATH  *UNUSUAL BRUISING OR BLEEDING  TENDERNESS IN MOUTH AND THROAT WITH OR WITHOUT PRESENCE OF ULCERS  *URINARY PROBLEMS  *BOWEL PROBLEMS  UNUSUAL RASH Items with * indicate a potential emergency and should be followed up as soon as possible.  Feel free to call the clinic you have any questions or concerns. The clinic phone number is [phone].

## 2014-06-01 ENCOUNTER — Ambulatory Visit (HOSPITAL_BASED_OUTPATIENT_CLINIC_OR_DEPARTMENT_OTHER): Payer: Commercial Managed Care - PPO

## 2014-06-01 ENCOUNTER — Ambulatory Visit (HOSPITAL_BASED_OUTPATIENT_CLINIC_OR_DEPARTMENT_OTHER): Payer: Commercial Managed Care - PPO | Admitting: Hematology and Oncology

## 2014-06-01 ENCOUNTER — Ambulatory Visit: Payer: Commercial Managed Care - PPO

## 2014-06-01 ENCOUNTER — Encounter: Payer: Self-pay | Admitting: Hematology and Oncology

## 2014-06-01 ENCOUNTER — Other Ambulatory Visit (HOSPITAL_BASED_OUTPATIENT_CLINIC_OR_DEPARTMENT_OTHER): Payer: Commercial Managed Care - PPO

## 2014-06-01 VITALS — BP 145/86 | HR 77 | Temp 98.2°F | Resp 18

## 2014-06-01 VITALS — BP 138/66 | HR 73 | Temp 98.3°F | Resp 19 | Ht 67.0 in | Wt 190.6 lb

## 2014-06-01 DIAGNOSIS — D696 Thrombocytopenia, unspecified: Secondary | ICD-10-CM

## 2014-06-01 DIAGNOSIS — D630 Anemia in neoplastic disease: Secondary | ICD-10-CM

## 2014-06-01 DIAGNOSIS — D701 Agranulocytosis secondary to cancer chemotherapy: Secondary | ICD-10-CM | POA: Insufficient documentation

## 2014-06-01 DIAGNOSIS — C8307 Small cell B-cell lymphoma, spleen: Secondary | ICD-10-CM

## 2014-06-01 DIAGNOSIS — T451X5A Adverse effect of antineoplastic and immunosuppressive drugs, initial encounter: Secondary | ICD-10-CM

## 2014-06-01 DIAGNOSIS — D72819 Decreased white blood cell count, unspecified: Secondary | ICD-10-CM

## 2014-06-01 DIAGNOSIS — Z5111 Encounter for antineoplastic chemotherapy: Secondary | ICD-10-CM

## 2014-06-01 DIAGNOSIS — Z5112 Encounter for antineoplastic immunotherapy: Secondary | ICD-10-CM

## 2014-06-01 LAB — LACTATE DEHYDROGENASE (CC13): LDH: 169 U/L (ref 125–245)

## 2014-06-01 LAB — CBC & DIFF AND RETIC
BASO%: 0.4 % (ref 0.0–2.0)
Basophils Absolute: 0 10*3/uL (ref 0.0–0.1)
EOS%: 2.7 % (ref 0.0–7.0)
Eosinophils Absolute: 0.1 10*3/uL (ref 0.0–0.5)
HCT: 30.6 % — ABNORMAL LOW (ref 34.8–46.6)
HGB: 10.3 g/dL — ABNORMAL LOW (ref 11.6–15.9)
Immature Retic Fract: 13.2 % — ABNORMAL HIGH (ref 1.60–10.00)
LYMPH%: 19.6 % (ref 14.0–49.7)
MCH: 28.4 pg (ref 25.1–34.0)
MCHC: 33.7 g/dL (ref 31.5–36.0)
MCV: 84.3 fL (ref 79.5–101.0)
MONO#: 0.1 10*3/uL (ref 0.1–0.9)
MONO%: 4.9 % (ref 0.0–14.0)
NEUT%: 72.4 % (ref 38.4–76.8)
NEUTROS ABS: 1.6 10*3/uL (ref 1.5–6.5)
Platelets: 117 10*3/uL — ABNORMAL LOW (ref 145–400)
RBC: 3.63 10*6/uL — AB (ref 3.70–5.45)
RDW: 17.9 % — ABNORMAL HIGH (ref 11.2–14.5)
RETIC %: 4.67 % — AB (ref 0.70–2.10)
Retic Ct Abs: 169.52 10*3/uL — ABNORMAL HIGH (ref 33.70–90.70)
WBC: 2.3 10*3/uL — AB (ref 3.9–10.3)
lymph#: 0.4 10*3/uL — ABNORMAL LOW (ref 0.9–3.3)

## 2014-06-01 LAB — COMPREHENSIVE METABOLIC PANEL (CC13)
ALBUMIN: 3.9 g/dL (ref 3.5–5.0)
ALK PHOS: 61 U/L (ref 40–150)
ALT: 14 U/L (ref 0–55)
AST: 14 U/L (ref 5–34)
Anion Gap: 10 mEq/L (ref 3–11)
BUN: 9.5 mg/dL (ref 7.0–26.0)
CALCIUM: 9.4 mg/dL (ref 8.4–10.4)
CHLORIDE: 104 meq/L (ref 98–109)
CO2: 24 mEq/L (ref 22–29)
Creatinine: 0.9 mg/dL (ref 0.6–1.1)
Glucose: 104 mg/dl (ref 70–140)
POTASSIUM: 4 meq/L (ref 3.5–5.1)
SODIUM: 139 meq/L (ref 136–145)
Total Bilirubin: 0.55 mg/dL (ref 0.20–1.20)
Total Protein: 7.3 g/dL (ref 6.4–8.3)

## 2014-06-01 LAB — TECHNOLOGIST REVIEW

## 2014-06-01 MED ORDER — SODIUM CHLORIDE 0.9 % IV SOLN
375.0000 mg/m2 | Freq: Once | INTRAVENOUS | Status: AC
  Administered 2014-06-01: 800 mg via INTRAVENOUS
  Filled 2014-06-01: qty 80

## 2014-06-01 MED ORDER — DIPHENHYDRAMINE HCL 25 MG PO CAPS
50.0000 mg | ORAL_CAPSULE | Freq: Once | ORAL | Status: AC
  Administered 2014-06-01: 50 mg via ORAL

## 2014-06-01 MED ORDER — DIPHENHYDRAMINE HCL 25 MG PO CAPS
ORAL_CAPSULE | ORAL | Status: AC
  Filled 2014-06-01: qty 2

## 2014-06-01 MED ORDER — ACETAMINOPHEN 325 MG PO TABS
ORAL_TABLET | ORAL | Status: AC
  Filled 2014-06-01: qty 2

## 2014-06-01 MED ORDER — PROCHLORPERAZINE MALEATE 10 MG PO TABS
ORAL_TABLET | ORAL | Status: AC
  Filled 2014-06-01: qty 1

## 2014-06-01 MED ORDER — SODIUM CHLORIDE 0.9 % IV SOLN
Freq: Once | INTRAVENOUS | Status: AC
  Administered 2014-06-01: 11:00:00 via INTRAVENOUS

## 2014-06-01 MED ORDER — PROCHLORPERAZINE MALEATE 10 MG PO TABS
10.0000 mg | ORAL_TABLET | Freq: Once | ORAL | Status: AC
  Administered 2014-06-01: 10 mg via ORAL

## 2014-06-01 MED ORDER — SODIUM CHLORIDE 0.9 % IV SOLN
0.1000 mg/kg | INTRAVENOUS | Status: DC
  Administered 2014-06-01: 9 mg via INTRAVENOUS
  Filled 2014-06-01: qty 9

## 2014-06-01 MED ORDER — ACETAMINOPHEN 325 MG PO TABS
650.0000 mg | ORAL_TABLET | Freq: Once | ORAL | Status: AC
  Administered 2014-06-01: 650 mg via ORAL

## 2014-06-01 NOTE — Progress Notes (Signed)
[facility] OFFICE PROGRESS NOTE  Patient Care Team: Dale E Feldpausch, MD as PCP - General (Family Medicine)  SUMMARY OF ONCOLOGIC HISTORY:   Lymphoma, marginal zone, spleen   03/23/2014 Bone Marrow Biopsy Bone marrow is involved   04/19/2014 Imaging PET CT scan showed lymphadenopathy and splenomegaly   04/27/2014 - 06/01/2014 Chemotherapy She received weekly rituximab and cladribine X 6 cycles    INTERVAL HISTORY: Please see below for problem oriented charting. She is seen prior to cycle 6 of treatment. She has mild intermittent left upper quadrant discomfort. She felt that the spleen size has reduced. She denies recent infection. REVIEW OF SYSTEMS:   Constitutional: Denies fevers, chills or abnormal weight loss Eyes: Denies blurriness of vision Ears, nose, mouth, throat, and face: Denies mucositis or sore throat Respiratory: Denies cough, dyspnea or wheezes Cardiovascular: Denies palpitation, chest discomfort or lower extremity swelling Gastrointestinal:  Denies nausea, heartburn or change in bowel habits Skin: Denies abnormal skin rashes Lymphatics: Denies new lymphadenopathy or easy bruising Neurological:Denies numbness, tingling or new weaknesses Behavioral/Psych: Mood is stable, no new changes  All other systems were reviewed with the patient and are negative.  I have reviewed the past medical history, past surgical history, social history and family history with the patient and they are unchanged from previous note.  ALLERGIES:  has No Known Allergies.  MEDICATIONS:  Current Outpatient Prescriptions  Medication Sig Dispense Refill  . acyclovir (ZOVIRAX) 400 MG tablet Take 1 tablet (400 mg total) by mouth 2 (two) times daily.  60 tablet  2  . Cholecalciferol (VITAMIN D) 2000 UNITS tablet Take 2,000 Units by mouth daily.      . docusate sodium (COLACE) 100 MG capsule Take 1 capsule (100 mg total) by mouth 2 (two) times daily.  100 capsule  3  . fluconazole  (DIFLUCAN) 100 MG tablet Take 1 tablet (100 mg total) by mouth daily.  30 tablet  2  . ibuprofen (ADVIL,MOTRIN) 400 MG tablet Take 400 mg by mouth every 6 (six) hours as needed.      . iron polysaccharides (NIFEREX) 150 MG capsule Take 1 capsule (150 mg total) by mouth 2 (two) times daily.  120 capsule  11  . Probiotic Product (PROBIOTIC DAILY PO) Take by mouth daily.      . sulfamethoxazole-trimethoprim (BACTRIM DS) 800-160 MG per tablet Take 1 tablet by mouth 3 (three) times a week.  24 tablet  1  . ondansetron (ZOFRAN-ODT) 8 MG disintegrating tablet Take 1 tablet (8 mg total) by mouth every 8 (eight) hours as needed for nausea or vomiting.  20 tablet  3  . predniSONE (DELTASONE) 20 MG tablet Take 3 tablets (60 mg total) by mouth daily with breakfast.  6 tablet  0  . prochlorperazine (COMPAZINE) 10 MG tablet        No current facility-administered medications for this visit.    PHYSICAL EXAMINATION: ECOG PERFORMANCE STATUS: 1 - Symptomatic but completely ambulatory  Filed Vitals:   06/01/14 0951  BP: 138/66  Pulse: 73  Temp: 98.3 F (36.8 C)  Resp: 19   Filed Weights   06/01/14 0951  Weight: 190 lb 9.6 oz (86.456 kg)    GENERAL:alert, no distress and comfortable. She is mildly obese SKIN: skin color, texture, turgor are normal, no rashes or significant lesions EYES: normal, Conjunctiva are pink and non-injected, sclera clear OROPHARYNX:no exudate, no erythema and lips, buccal mucosa, and tongue normal  NECK: supple, thyroid normal size, non-tender, without nodularity LYMPH:    no palpable lymphadenopathy in the cervical, axillary or inguinal LUNGS: clear to auscultation and percussion with normal breathing effort HEART: regular rate & rhythm and no murmurs and no lower extremity edema ABDOMEN:abdomen soft, non-tender and normal bowel sounds. The spleen is no longer palpable Musculoskeletal:no cyanosis of digits and no clubbing  NEURO: alert & oriented x 3 with fluent speech, no  focal motor/sensory deficits  LABORATORY DATA:  I have reviewed the data as listed    Component Value Date/Time   NA 139 05/25/2014 0939   NA 137 04/11/2014 1059   K 3.7 05/25/2014 0939   K 4.2 04/11/2014 1059   CL 102 04/11/2014 1059   CO2 22 05/25/2014 0939   CO2 28 04/11/2014 1059   GLUCOSE 125 05/25/2014 0939   GLUCOSE 95 04/11/2014 1059   BUN 11.8 05/25/2014 0939   BUN 14 04/11/2014 1059   CREATININE 0.9 05/25/2014 0939   CREATININE 0.85 04/11/2014 1059   CREATININE 0.99 01/20/2012 1452   CREATININE 0.91 02/06/2011 0856   CALCIUM 9.1 05/25/2014 0939   CALCIUM 9.1 04/11/2014 1059   PROT 7.1 05/25/2014 0939   PROT 6.7 04/11/2014 1059   ALBUMIN 3.8 05/25/2014 0939   ALBUMIN 4.1 04/11/2014 1059   AST 13 05/25/2014 0939   AST 15 04/11/2014 1059   ALT 11 05/25/2014 0939   ALT 14 04/11/2014 1059   ALKPHOS 53 05/25/2014 0939   ALKPHOS 65 04/11/2014 1059   BILITOT 0.45 05/25/2014 0939   BILITOT 0.7 04/11/2014 1059   GFRNONAA 76 02/14/2014 1105   GFRAA 88 02/14/2014 1105    No results found for this basename: SPEP, UPEP,  kappa and lambda light chains    Lab Results  Component Value Date   WBC 2.3* 06/01/2014   NEUTROABS 1.6 06/01/2014   HGB 10.3* 06/01/2014   HCT 30.6* 06/01/2014   MCV 84.3 06/01/2014   PLT 117* 06/01/2014      Chemistry      Component Value Date/Time   NA 139 05/25/2014 0939   NA 137 04/11/2014 1059   K 3.7 05/25/2014 0939   K 4.2 04/11/2014 1059   CL 102 04/11/2014 1059   CO2 22 05/25/2014 0939   CO2 28 04/11/2014 1059   BUN 11.8 05/25/2014 0939   BUN 14 04/11/2014 1059   CREATININE 0.9 05/25/2014 0939   CREATININE 0.85 04/11/2014 1059   CREATININE 0.99 01/20/2012 1452   CREATININE 0.91 02/06/2011 0856      Component Value Date/Time   CALCIUM 9.1 05/25/2014 0939   CALCIUM 9.1 04/11/2014 1059   ALKPHOS 53 05/25/2014 0939   ALKPHOS 65 04/11/2014 1059   AST 13 05/25/2014 0939   AST 15 04/11/2014 1059   ALT 11 05/25/2014 0939   ALT 14 04/11/2014 1059   BILITOT 0.45 05/25/2014 0939    BILITOT 0.7 04/11/2014 1059      ASSESSMENT & PLAN:  Lymphoma, marginal zone, spleen Overall, she tolerated chemotherapy well apart from the first cycle of treatment when she had slight nausea. Clinically, she has excellent response to treatment. The spleen is no longer palpable. I will continue treatment without dosage adjustment. She will receive her final treatment today. I will refer her back to her prior oncologist for future followup. The patient will continue on prophylactic antimicrobial therapy for now.   Thrombocytopenia, unspecified This is likely due to recent treatment. The patient denies recent history of bleeding such as epistaxis, hematuria or hematochezia. She is asymptomatic from the low platelet count. I will   observe for now.  she does not require transfusion now. I will continue the chemotherapy at current dose without dosage adjustment.   Anemia in neoplastic disease This is likely due to recent treatment. The patient denies recent history of bleeding such as epistaxis, hematuria or hematochezia. She is asymptomatic from the anemia. I will observe for now.    Leukopenia due to antineoplastic chemotherapy This is likely due to recent treatment. The patient denies recent history of fevers, cough, chills, diarrhea or dysuria. She is asymptomatic from the leukopenia. I will observe for now.  I will continue the chemotherapy at current dose without dosage adjustment.     No orders of the defined types were placed in this encounter.   All questions were answered. The patient knows to call the clinic with any problems, questions or concerns. No barriers to learning was detected.    GORSUCH, NI, MD 06/01/2014 10:09 AM    

## 2014-06-01 NOTE — Assessment & Plan Note (Addendum)
Overall, she tolerated chemotherapy well apart from the first cycle of treatment when she had slight nausea. Clinically, she has excellent response to treatment. The spleen is no longer palpable. I will continue treatment without dosage adjustment. She will receive her final treatment today. I will refer her back to her prior oncologist for future followup. The patient will continue on prophylactic antimicrobial therapy for now.

## 2014-06-01 NOTE — Patient Instructions (Signed)
[facility] Discharge Instructions for Patients Receiving Chemotherapy  Today you received the following chemotherapy agents rituxan/cladribine  To help prevent nausea and vomiting after your treatment, we encourage you to take your nausea medication as directed   If you develop nausea and vomiting that is not controlled by your nausea medication, call the clinic.   BELOW ARE SYMPTOMS THAT SHOULD BE REPORTED IMMEDIATELY:  *FEVER GREATER THAN 100.5 F  *CHILLS WITH OR WITHOUT FEVER  NAUSEA AND VOMITING THAT IS NOT CONTROLLED WITH YOUR NAUSEA MEDICATION  *UNUSUAL SHORTNESS OF BREATH  *UNUSUAL BRUISING OR BLEEDING  TENDERNESS IN MOUTH AND THROAT WITH OR WITHOUT PRESENCE OF ULCERS  *URINARY PROBLEMS  *BOWEL PROBLEMS  UNUSUAL RASH Items with * indicate a potential emergency and should be followed up as soon as possible.  Feel free to call the clinic you have any questions or concerns. The clinic phone number is [phone].

## 2014-06-01 NOTE — Assessment & Plan Note (Signed)
This is likely due to recent treatment. The patient denies recent history of bleeding such as epistaxis, hematuria or hematochezia. She is asymptomatic from the anemia. I will observe for now.   

## 2014-06-01 NOTE — Assessment & Plan Note (Signed)
This is likely due to recent treatment. The patient denies recent history of fevers, cough, chills, diarrhea or dysuria. She is asymptomatic from the leukopenia. I will observe for now.  I will continue the chemotherapy at current dose without dosage adjustment. 

## 2014-06-01 NOTE — Assessment & Plan Note (Signed)
This is likely due to recent treatment. The patient denies recent history of bleeding such as epistaxis, hematuria or hematochezia. She is asymptomatic from the low platelet count. I will observe for now.  she does not require transfusion now. I will continue the chemotherapy at current dose without dosage adjustment.  

## 2014-06-02 ENCOUNTER — Other Ambulatory Visit: Payer: Self-pay | Admitting: Oncology

## 2014-06-02 DIAGNOSIS — D472 Monoclonal gammopathy: Secondary | ICD-10-CM

## 2014-06-02 DIAGNOSIS — C8307 Small cell B-cell lymphoma, spleen: Secondary | ICD-10-CM

## 2014-06-06 ENCOUNTER — Telehealth: Payer: Self-pay | Admitting: *Deleted

## 2014-06-06 NOTE — Telephone Encounter (Signed)
Pt called/informed of PET scan appt 10/19 @ 1100Am @ [place]; NPO x 6 hours.

## 2014-06-07 ENCOUNTER — Encounter (HOSPITAL_COMMUNITY): Payer: Self-pay

## 2014-07-31 ENCOUNTER — Other Ambulatory Visit (HOSPITAL_BASED_OUTPATIENT_CLINIC_OR_DEPARTMENT_OTHER): Payer: Commercial Managed Care - PPO

## 2014-07-31 ENCOUNTER — Other Ambulatory Visit: Payer: Commercial Managed Care - PPO

## 2014-07-31 ENCOUNTER — Ambulatory Visit (HOSPITAL_COMMUNITY)
Admission: RE | Admit: 2014-07-31 | Discharge: 2014-07-31 | Disposition: A | Discharge status: Home / Self Care | Payer: Commercial Managed Care - PPO | Source: Ambulatory Visit | Attending: Oncology | Admitting: Oncology

## 2014-07-31 DIAGNOSIS — C8307 Small cell B-cell lymphoma, spleen: Secondary | ICD-10-CM

## 2014-07-31 DIAGNOSIS — C8587 Other specified types of non-Hodgkin lymphoma, spleen: Secondary | ICD-10-CM

## 2014-07-31 DIAGNOSIS — D472 Monoclonal gammopathy: Secondary | ICD-10-CM

## 2014-07-31 LAB — COMPREHENSIVE METABOLIC PANEL (CC13)
ALBUMIN: 3.7 g/dL (ref 3.5–5.0)
ALT: 24 U/L (ref 0–55)
AST: 16 U/L (ref 5–34)
Alkaline Phosphatase: 57 U/L (ref 40–150)
Anion Gap: 7 mEq/L (ref 3–11)
BUN: 10.8 mg/dL (ref 7.0–26.0)
CHLORIDE: 107 meq/L (ref 98–109)
CO2: 26 mEq/L (ref 22–29)
Calcium: 9.2 mg/dL (ref 8.4–10.4)
Creatinine: 0.9 mg/dL (ref 0.6–1.1)
Glucose: 96 mg/dl (ref 70–140)
POTASSIUM: 4.4 meq/L (ref 3.5–5.1)
Sodium: 140 mEq/L (ref 136–145)
TOTAL PROTEIN: 6.4 g/dL (ref 6.4–8.3)
Total Bilirubin: 0.75 mg/dL (ref 0.20–1.20)

## 2014-07-31 LAB — CBC WITH DIFFERENTIAL/PLATELET
BASO%: 0.3 % (ref 0.0–2.0)
BASOS ABS: 0 10*3/uL (ref 0.0–0.1)
EOS%: 2.1 % (ref 0.0–7.0)
Eosinophils Absolute: 0.1 10*3/uL (ref 0.0–0.5)
HEMATOCRIT: 35.1 % (ref 34.8–46.6)
HEMOGLOBIN: 11.4 g/dL — AB (ref 11.6–15.9)
LYMPH%: 10.2 % — ABNORMAL LOW (ref 14.0–49.7)
MCH: 28 pg (ref 25.1–34.0)
MCHC: 32.5 g/dL (ref 31.5–36.0)
MCV: 86.1 fL (ref 79.5–101.0)
MONO#: 0.3 10*3/uL (ref 0.1–0.9)
MONO%: 8.5 % (ref 0.0–14.0)
NEUT#: 3.2 10*3/uL (ref 1.5–6.5)
NEUT%: 78.9 % — AB (ref 38.4–76.8)
Platelets: 162 10*3/uL (ref 145–400)
RBC: 4.08 10*6/uL (ref 3.70–5.45)
RDW: 16.2 % — ABNORMAL HIGH (ref 11.2–14.5)
WBC: 4 10*3/uL (ref 3.9–10.3)
lymph#: 0.4 10*3/uL — ABNORMAL LOW (ref 0.9–3.3)

## 2014-07-31 LAB — GLUCOSE, CAPILLARY: Glucose-Capillary: 97 mg/dL (ref 70–99)

## 2014-07-31 LAB — LACTATE DEHYDROGENASE (CC13): LDH: 151 U/L (ref 125–245)

## 2014-07-31 MED ORDER — FLUDEOXYGLUCOSE F - 18 (FDG) INJECTION
9.5000 | Freq: Once | INTRAVENOUS | Status: AC | PRN
  Administered 2014-07-31: 9.5 via INTRAVENOUS

## 2014-08-01 LAB — IGG, IGA, IGM
IGA: 206 mg/dL (ref 69–380)
IgG (Immunoglobin G), Serum: 661 mg/dL — ABNORMAL LOW (ref 690–1700)
IgM, Serum: 192 mg/dL (ref 52–322)

## 2014-08-07 ENCOUNTER — Ambulatory Visit (INDEPENDENT_AMBULATORY_CARE_PROVIDER_SITE_OTHER): Payer: Commercial Managed Care - PPO | Admitting: Oncology

## 2014-08-07 ENCOUNTER — Other Ambulatory Visit: Payer: Self-pay | Admitting: Oncology

## 2014-08-07 ENCOUNTER — Encounter: Payer: Self-pay | Admitting: Oncology

## 2014-08-07 VITALS — BP 152/73 | HR 91 | Temp 98.0°F | Ht 67.0 in | Wt 201.7 lb

## 2014-08-07 DIAGNOSIS — C8587 Other specified types of non-Hodgkin lymphoma, spleen: Secondary | ICD-10-CM

## 2014-08-07 DIAGNOSIS — D472 Monoclonal gammopathy: Secondary | ICD-10-CM

## 2014-08-07 DIAGNOSIS — R768 Other specified abnormal immunological findings in serum: Secondary | ICD-10-CM

## 2014-08-07 DIAGNOSIS — C8307 Small cell B-cell lymphoma, spleen: Secondary | ICD-10-CM

## 2014-08-07 NOTE — Patient Instructions (Signed)
Repeat lab and CT scan at [facility] on 01/30/15 MD visit with Dr Darnell Level at [facility] Internal med center 1-2 weeks after scan

## 2014-08-07 NOTE — Progress Notes (Signed)
Patient ID: Kara Perkins, female   DOB: 1971/03/09, 43 y.o.   MRN: [id number] Hematology and Oncology Follow Up Visit  Kara Perkins [id number] 1971/07/25 43 y.o. 08/07/2014 1:09 PM   Principle Diagnosis: Encounter Diagnoses  Name Primary?  . IgM monoclonal gammopathy of uncertain significance   . Positive Coombs test   . Lymphoma, marginal zone, spleen Yes     Interim History:   The patient has recently completed a course of treatment with cladribine 0.1 mg per kilogram IV plus Rituxan 375 mg per meter squared both given weekly x6 weeks between July 16 and 06/01/2014 for recently diagnosed marginal zone lymphoma presenting with a slowly progressive rise in serum IgM antibody and ultimately development of splenomegaly. She had no clinical lymphadenopathy but a staging PET scan done on 04/19/2014 other than showing an enlarged, hypermetabolic, spleen 23 cm in maximum dimension, also showed activity in bilateral axillary lymph glands and in addition,  normal size but hypermetabolic lymph glands along the chest wall, in the upper abdomen, and pelvis. She tolerated treatments well. She developed transient mild leukopenia with lowest white count 2200, and mild thrombocytopenia with lowest platelet count 110,000. White count is now recovered to 4000 with 80% neutrophils, and platelets 162,000 as of 07/31/2014. She did not get any infections. She did not require hospitalization. IgM paraprotein has normalized from peak pretreatment value of 1130 mg percent to current value of 192. PET scan which I personally reviewed with her today shows complete metabolic response in all lymph node areas in the spleen, persistent mild splenomegaly but reduction down from 23 cm to 15 cm.   Medications: reviewed  Allergies: No Known Allergies  Review of Systems: Hematology:  No bleeding or bruising ENT ROS: No sore throat  Breast ROS:  Respiratory ROS: No cough or dyspnea Cardiovascular ROS:  No chest  pain or palpitations Gastrointestinal ROS: She still has some left upper quadrant discomfort   Genito-Urinary ROS:  Musculoskeletal ROS: No muscle bone or joint pain Neurological ROS: No headache or change in vision Dermatological ROS: No rash or ecchymosis Remaining ROS negative:   Physical Exam: Blood pressure 152/73, pulse 91, temperature 98 F (36.7 C), temperature source Oral, height 5\' 7"  (1.702 m), weight 201 lb 11.2 oz (91.491 kg), last menstrual period 07/18/2014, SpO2 100.00%. Wt Readings from Last 3 Encounters:  08/07/14 201 lb 11.2 oz (91.491 kg)  06/01/14 190 lb 9.6 oz (86.456 kg)  05/11/14 189 lb 9.6 oz (86.002 kg)     General appearance: Well-nourished Caucasian woman Kara Perkins: Pharynx no erythema, exudate, mass, or ulcer. No thyromegaly or thyroid nodules Lymph nodes: No cervical, supraclavicular, or axillary lymphadenopathy Breasts:  Lungs: Clear to auscultation, resonant to percussion throughout Heart: Regular rhythm, no murmur, no gallop, no rub, no click, no edema Abdomen: Soft, nontender, normal bowel sounds, no mass, no organomegaly Extremities: No edema, no calf tenderness Musculoskeletal: no joint deformities GU:  Vascular: Carotid pulses 2+, no bruits,  Neurologic: Alert, oriented, PERRLA,  cranial nerves grossly normal, motor strength 5 over 5, reflexes 1+ symmetric at the biceps, absent symmetric at the knees, upper body coordination normal, gait normal, Skin: No rash or ecchymosis  Lab Results: CBC W/Diff    Component Value Date/Time   WBC 4.0 07/31/2014 0937   WBC 3.8* 04/04/2014 1019   RBC 4.08 07/31/2014 0937   RBC 3.81* 04/04/2014 1019   RBC 3.81* 04/04/2014 1019   HGB 11.4* 07/31/2014 0937   HGB 10.4* 04/04/2014 1019   HCT  35.1 07/31/2014 0937   HCT 31.8* 04/04/2014 1019   PLT 162 07/31/2014 0937   PLT 122* 04/04/2014 1019   MCV 86.1 07/31/2014 0937   MCV 83.5 04/04/2014 1019   MCH 28.0 07/31/2014 0937   MCH 27.3 04/04/2014 1019   MCHC 32.5  07/31/2014 0937   MCHC 32.7 04/04/2014 1019   RDW 16.2* 07/31/2014 0937   RDW 15.5 04/04/2014 1019   LYMPHSABS 0.4* 07/31/2014 0937   LYMPHSABS 1.7 04/04/2014 1019   MONOABS 0.3 07/31/2014 0937   MONOABS 0.2 04/04/2014 1019   EOSABS 0.1 07/31/2014 0937   EOSABS 0.1 04/04/2014 1019   BASOSABS 0.0 07/31/2014 0937   BASOSABS 0.0 04/04/2014 1019     Chemistry      Component Value Date/Time   NA 140 07/31/2014 0937   NA 137 04/11/2014 1059   K 4.4 07/31/2014 0937   K 4.2 04/11/2014 1059   CL 102 04/11/2014 1059   CO2 26 07/31/2014 0937   CO2 28 04/11/2014 1059   BUN 10.8 07/31/2014 0937   BUN 14 04/11/2014 1059   CREATININE 0.9 07/31/2014 0937   CREATININE 0.85 04/11/2014 1059   CREATININE 0.99 01/20/2012 1452   CREATININE 0.91 02/06/2011 0856      Component Value Date/Time   CALCIUM 9.2 07/31/2014 0937   CALCIUM 9.1 04/11/2014 1059   ALKPHOS 57 07/31/2014 0937   ALKPHOS 65 04/11/2014 1059   AST 16 07/31/2014 0937   AST 15 04/11/2014 1059   ALT 24 07/31/2014 0937   ALT 14 04/11/2014 1059   BILITOT 0.75 07/31/2014 0937   BILITOT 0.7 04/11/2014 1059       Radiological Studies: Nm Pet Image Restag (ps) Skull Base To Thigh  07/31/2014   CLINICAL DATA:  Subsequent treatment strategy for lymphoma. Marginal zone lymphoma. Splenomegaly.  EXAM: NUCLEAR MEDICINE PET SKULL BASE TO THIGH  TECHNIQUE: 9.5 mCi F-18 FDG was injected intravenously. Full-ring PET imaging was performed from the skull base to thigh after the radiotracer. CT data was obtained and used for attenuation correction and anatomic localization.  FASTING BLOOD GLUCOSE:  Value: 97 mg/dl  COMPARISON:  PET-CT 04/19/2014  FINDINGS: NECK  No hypermetabolic lymph nodes in the neck.  CHEST  Interval resolution of the hypermetabolic axillary adenopathy. The lobe were moderately hypermetabolic on comparison exam. No hypermetabolic mediastinal lymph nodes. No suspicious pulmonary nodules.  ABDOMEN/PELVIS  Spleen is reduced in volume measuring 15 cm  in craniocaudad dimension compared to 20 cm on prior. The spleen reduced in metabolic activity with SUV max = 2.4 compared to SUV max of 3.8. Small previously hypermetabolic iliac lymph nodes are normal size and metabolic activity.  SKELETON  No focal hypermetabolic activity to suggest skeletal metastasis. There is diffuse mild metabolic activity.  IMPRESSION: 1. Complete response chemotherapy. 2. Reduction metabolic activity of the spleen. Reduction in size of the spleen which is still mildly enlarged. 3. Axillary, periportal and iliac lymph nodes are now normal size with normal metabolic activity. 4. Of note, the metabolic activity of the lymph nodes and spleen was mild to moderate on comparison exams consistent with low-grade lymphoma.   Electronically Signed   By: Suzy Bouchard M.D.   On: 07/31/2014 12:31    Impression:  #1. Marginal zone lymphoma Complete response to treatment outlined above. I will get a followup CT scan again in 6 months.  #2. IgM monoclonal gammopathy secondary to #1 Complete resolution with response to treatment above.  #3. 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   CC: Patient Care Team: Sofie Hartigan, MD as PCP - General (Family Medicine)   Annia Belt, MD 10/26/20151:09 PM

## 2014-08-08 ENCOUNTER — Telehealth: Payer: Self-pay | Admitting: Oncology

## 2014-08-08 NOTE — Telephone Encounter (Signed)
s.w. pt and advised on April 2016 appt.....pt ok and aware °

## 2014-08-17 ENCOUNTER — Other Ambulatory Visit: Payer: Self-pay | Admitting: Hematology and Oncology

## 2015-01-30 ENCOUNTER — Ambulatory Visit (HOSPITAL_COMMUNITY)
Admission: RE | Admit: 2015-01-30 | Discharge: 2015-01-30 | Disposition: A | Discharge status: Home / Self Care | Payer: Commercial Managed Care - PPO | Source: Ambulatory Visit | Attending: Oncology | Admitting: Oncology

## 2015-01-30 ENCOUNTER — Other Ambulatory Visit (HOSPITAL_BASED_OUTPATIENT_CLINIC_OR_DEPARTMENT_OTHER): Payer: Commercial Managed Care - PPO

## 2015-01-30 ENCOUNTER — Encounter (HOSPITAL_COMMUNITY): Payer: Self-pay

## 2015-01-30 DIAGNOSIS — C8587 Other specified types of non-Hodgkin lymphoma, spleen: Secondary | ICD-10-CM | POA: Diagnosis not present

## 2015-01-30 DIAGNOSIS — C8307 Small cell B-cell lymphoma, spleen: Secondary | ICD-10-CM

## 2015-01-30 DIAGNOSIS — C8590 Non-Hodgkin lymphoma, unspecified, unspecified site: Secondary | ICD-10-CM | POA: Insufficient documentation

## 2015-01-30 LAB — CBC & DIFF AND RETIC
BASO%: 0.3 % (ref 0.0–2.0)
Basophils Absolute: 0 10*3/uL (ref 0.0–0.1)
EOS%: 2.7 % (ref 0.0–7.0)
Eosinophils Absolute: 0.1 10*3/uL (ref 0.0–0.5)
HEMATOCRIT: 34.9 % (ref 34.8–46.6)
HEMOGLOBIN: 11.3 g/dL — AB (ref 11.6–15.9)
IMMATURE RETIC FRACT: 10.7 % — AB (ref 1.60–10.00)
LYMPH%: 10.4 % — ABNORMAL LOW (ref 14.0–49.7)
MCH: 26.5 pg (ref 25.1–34.0)
MCHC: 32.4 g/dL (ref 31.5–36.0)
MCV: 81.7 fL (ref 79.5–101.0)
MONO#: 0.2 10*3/uL (ref 0.1–0.9)
MONO%: 7.1 % (ref 0.0–14.0)
NEUT#: 2.4 10*3/uL (ref 1.5–6.5)
NEUT%: 79.5 % — ABNORMAL HIGH (ref 38.4–76.8)
Platelets: 136 10*3/uL — ABNORMAL LOW (ref 145–400)
RBC: 4.27 10*6/uL (ref 3.70–5.45)
RDW: 15.7 % — ABNORMAL HIGH (ref 11.2–14.5)
Retic %: 3.49 % — ABNORMAL HIGH (ref 0.70–2.10)
Retic Ct Abs: 149.02 10*3/uL — ABNORMAL HIGH (ref 33.70–90.70)
WBC: 3 10*3/uL — ABNORMAL LOW (ref 3.9–10.3)
lymph#: 0.3 10*3/uL — ABNORMAL LOW (ref 0.9–3.3)

## 2015-01-30 LAB — COMPREHENSIVE METABOLIC PANEL (CC13)
ALK PHOS: 77 U/L (ref 40–150)
ALT: 27 U/L (ref 0–55)
AST: 21 U/L (ref 5–34)
Albumin: 4.2 g/dL (ref 3.5–5.0)
Anion Gap: 11 mEq/L (ref 3–11)
BUN: 11.5 mg/dL (ref 7.0–26.0)
CO2: 25 meq/L (ref 22–29)
Calcium: 9.1 mg/dL (ref 8.4–10.4)
Chloride: 103 mEq/L (ref 98–109)
Creatinine: 0.8 mg/dL (ref 0.6–1.1)
EGFR: 86 mL/min/{1.73_m2} — ABNORMAL LOW (ref >90)
GLUCOSE: 101 mg/dL (ref 70–140)
POTASSIUM: 4.8 meq/L (ref 3.5–5.1)
Sodium: 139 mEq/L (ref 136–145)
Total Bilirubin: 0.48 mg/dL (ref 0.20–1.20)
Total Protein: 7 g/dL (ref 6.4–8.3)

## 2015-01-30 LAB — LACTATE DEHYDROGENASE (CC13): LDH: 254 U/L — ABNORMAL HIGH (ref 125–245)

## 2015-01-30 MED ORDER — IOHEXOL 300 MG/ML  SOLN
100.0000 mL | Freq: Once | INTRAMUSCULAR | Status: AC | PRN
  Administered 2015-01-30: 100 mL via INTRAVENOUS

## 2015-01-31 ENCOUNTER — Telehealth: Payer: Self-pay | Admitting: *Deleted

## 2015-01-31 NOTE — Telephone Encounter (Signed)
Called pt - no answer left message result of CT Scan is back. Pt had called back and stated I could leave message if she does not answer the telephone.  Called pt again - no answer; left message "scan with stable findings, spleen 15 cm no change from post rx and down from 21 cm pre rx; happy with result" . And will discuss at 5/2 appt. Per Dr Beryle Beams.

## 2015-01-31 NOTE — Telephone Encounter (Signed)
-----   Message from Annia Belt, MD sent at 01/31/2015 11:02 AM EDT ----- Call pt: scan with stable findings spleen 15 cm no change from post Rx PET and down from 21 cm pre-Rx.  Will discuss at time of 5/2/ visit but I am happy with this result.

## 2015-02-12 ENCOUNTER — Ambulatory Visit (INDEPENDENT_AMBULATORY_CARE_PROVIDER_SITE_OTHER): Payer: Commercial Managed Care - PPO | Admitting: Oncology

## 2015-02-12 ENCOUNTER — Encounter: Payer: Self-pay | Admitting: Oncology

## 2015-02-12 ENCOUNTER — Other Ambulatory Visit: Payer: Self-pay | Admitting: Oncology

## 2015-02-12 VITALS — BP 134/65 | HR 75 | Temp 98.2°F | Ht 67.0 in | Wt 205.7 lb

## 2015-02-12 DIAGNOSIS — N920 Excessive and frequent menstruation with regular cycle: Secondary | ICD-10-CM

## 2015-02-12 DIAGNOSIS — C8307 Small cell B-cell lymphoma, spleen: Secondary | ICD-10-CM

## 2015-02-12 DIAGNOSIS — D72819 Decreased white blood cell count, unspecified: Secondary | ICD-10-CM | POA: Diagnosis not present

## 2015-02-12 DIAGNOSIS — R768 Other specified abnormal immunological findings in serum: Secondary | ICD-10-CM

## 2015-02-12 DIAGNOSIS — C8587 Other specified types of non-Hodgkin lymphoma, spleen: Secondary | ICD-10-CM

## 2015-02-12 DIAGNOSIS — D500 Iron deficiency anemia secondary to blood loss (chronic): Secondary | ICD-10-CM

## 2015-02-12 DIAGNOSIS — D509 Iron deficiency anemia, unspecified: Secondary | ICD-10-CM

## 2015-02-12 DIAGNOSIS — Z8719 Personal history of other diseases of the digestive system: Secondary | ICD-10-CM

## 2015-02-12 DIAGNOSIS — D472 Monoclonal gammopathy: Secondary | ICD-10-CM

## 2015-02-12 LAB — CBC WITH DIFFERENTIAL/PLATELET
BASOS PCT: 0 % (ref 0–1)
Basophils Absolute: 0 10*3/uL (ref 0.0–0.1)
Eosinophils Absolute: 0.1 10*3/uL (ref 0.0–0.7)
Eosinophils Relative: 8 % — ABNORMAL HIGH (ref 0–5)
HCT: 29.6 % — ABNORMAL LOW (ref 36.0–46.0)
HEMOGLOBIN: 9.4 g/dL — AB (ref 12.0–15.0)
LYMPHS PCT: 44 % (ref 12–46)
Lymphs Abs: 0.4 10*3/uL — ABNORMAL LOW (ref 0.7–4.0)
MCH: 26.1 pg (ref 26.0–34.0)
MCHC: 31.8 g/dL (ref 30.0–36.0)
MCV: 82.2 fL (ref 78.0–100.0)
MPV: 9.7 fL (ref 8.6–12.4)
Monocytes Absolute: 0.3 10*3/uL (ref 0.1–1.0)
Monocytes Relative: 30 % — ABNORMAL HIGH (ref 3–12)
Neutro Abs: 0.2 10*3/uL — ABNORMAL LOW (ref 1.7–7.7)
Neutrophils Relative %: 18 % — ABNORMAL LOW (ref 43–77)
Platelets: 170 10*3/uL (ref 150–400)
RBC: 3.6 MIL/uL — ABNORMAL LOW (ref 3.87–5.11)
RDW: 17.2 % — AB (ref 11.5–15.5)
WBC: 0.9 10*3/uL — AB (ref 4.0–10.5)

## 2015-02-12 LAB — LACTATE DEHYDROGENASE: LDH: 160 U/L (ref 94–250)

## 2015-02-12 LAB — RETICULOCYTES
ABS Retic: 226.8 10*3/uL — ABNORMAL HIGH (ref 19.0–186.0)
RBC.: 3.6 MIL/uL — ABNORMAL LOW (ref 3.87–5.11)
Retic Ct Pct: 6.3 % — ABNORMAL HIGH (ref 0.4–2.3)

## 2015-02-12 NOTE — Patient Instructions (Signed)
To lab here today Lab and CT scans 07/30/15 at [facility] Return visit with Dr Darnell Level 1-2 weeks after lab  & CT in October

## 2015-02-12 NOTE — Progress Notes (Signed)
Patient ID: Kara Perkins, female   DOB: [birth year], 44 y.o.   MRN: [id number] Hematology and Oncology Follow Up Visit  Kara Perkins [id number] 12/25/1970 44 y.o. 02/12/2015 10:58 AM   Principle Diagnosis: Encounter Diagnoses  Name Primary?  . Positive Coombs test   . Lymphoma, marginal zone, spleen Yes   clinical summary: 44 year old woman I first saw when she was pregnant back in 2008 and was found to have a positive direct Coombs' test. She never demonstrated any signs of hemolysis. She was followed closely through the pregnancy and blood counts remained stable. Over the years, the positive Coombs test extinguished. As part of her initial evaluation, serum immunoglobulins were done. She had a mild elevation of IgM. No concomitant suppression of her other immunoglobulins. I had forgotten about this until she had some GI problems back in March of 2011 when she developed acute ulcerative esophagitis. As part of her GI evaluation immunoglobulin studies were done and she showed a persistent elevation of IgM. She was reevaluated. Initial value for IgM was 425 mg percent compared with 530 mg percent done by her gastroenterologist.  IgM levels monitored more closely since that time. There was  a slow but steady trend for the IgM levels to be going up. A single 24-hour total urine protein was elevated at 667 mg in a pattern of nonselective proteinuria on 12/30/2009 but this was not  reproducible. Subsequent study done 07/17/2010 with total urine protein on 89 mg, on 02/06/2011 18 mg. There is a monoclonal IgM kappa paraprotein in the urine as well as serum. In addition to the slow rise in her IgM, there was also  a trend for progressive anemia, intermittent leukopenia, and intermittent fall in her platelet count but no single platelet count has been less than 120,000. Interpreting her hemoglobin has been complicated by the fact that she is chronically iron deficient from menorrhagia.  CT scan of the  abdomen and pelvis done 01/02/2010 did not show any lymphadenopathy or splenomegaly at that time. I did a bone marrow aspiration and biopsy on 12/11/2011. There was no evidence for either Waldenstrm's or multiple myeloma on that specimen.  She remained asymptomatic. However, lab done in May, 2015 showed progressive leukopenia with total white count 3,100, 44% neutrophils, 8% lymphocytes, I percent monocytes, 3 eosinophils. Platelet count down from 141,000 and October 2014 to 120,000 . Hemoglobin has come up from 8.7 g to 9.8 g on oral iron replacement.  I detected splenomegaly 5 cm below left costal margin on my 02/21/2014 exam. This was confirmed on a CT scan which showed spleen measuring 23 cm. Repeat bone marrow aspiration and biopsy done 03/23/2014 showed a monoclonal population of B lymphocytes expressing CD20 negative for CD5 and CD20 5. No gross lymphoid aggregates or infiltrates. 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 I reviewed treatment options at length with the patient and her husband. I recommended splenectomy as my first choice as opposed to chemotherapy. However the patient elected to take a chemotherapy program. Treatment administered by Dr. Heath Lark. She completed a course of treatment with cladribine 0.1 mg per kilogram IV plus Rituxan 375 mg per meter squared both given weekly x6 weeks between July 16 and 06/01/2014   She had no clinical lymphadenopathy but a staging PET scan done on 04/19/2014 other than showing an enlarged, hypermetabolic, spleen 23 cm in maximum dimension, also showed activity in bilateral axillary lymph glands and in addition, normal sized but hypermetabolic lymph glands along the chest  wall, in the upper abdomen, and pelvis. She tolerated treatments well. She developed transient mild leukopenia with lowest white count 2200, and mild thrombocytopenia with lowest platelet count 110,000. White count recovered to 4000 with 80% neutrophils, and  platelets 162,000 as of 07/31/2014. She did not get any infections. She did not require hospitalization. IgM paraprotein  normalized from peak pretreatment value of 1130 mg percent to most recent value of 192 on 07/31/14 . PET scan  showed complete metabolic response in all lymph node areas and the spleen, but persistent mild splenomegaly but reduction down from 23 cm to 15 cm.   Interim History:   Overall she is doing well. She has had no interim medical problems. She does report increasing fatigue but admits that she has been working longer hours. Both of her daughters is back in a competitive sport requiring mom to drive her around a lot. She has had no interim fever or infection. Appetite is good. Weight stable.  Medications: reviewed  Allergies: No Known Allergies  Review of Systems: See history of present illness Remaining ROS negative:   Physical Exam: Blood pressure 134/65, pulse 75, temperature 98.2 F (36.8 C), temperature source Oral, height '5\' 7"'  (1.702 m), weight 205 lb 11.2 oz (93.305 kg), last menstrual period 01/15/2015, SpO2 100 %. Wt Readings from Last 3 Encounters:  02/12/15 205 lb 11.2 oz (93.305 kg)  08/07/14 201 lb 11.2 oz (91.491 kg)  06/01/14 190 lb 9.6 oz (86.456 kg)     General appearance: Well-nourished Caucasian woman HENNT: Pharynx no erythema, exudate, mass, or ulcer. No thyromegaly or thyroid nodules Lymph nodes: No cervical, supraclavicular, or axillary lymphadenopathy Breasts:  Lungs: Clear to auscultation, resonant to percussion throughout Heart: Regular rhythm, no murmur, no gallop, no rub, no click, no edema Abdomen: Soft, nontender, normal bowel sounds, no mass, no organomegaly Extremities: No edema, no calf tenderness Musculoskeletal: no joint deformities GU:  Vascular: Carotid pulses 2+, no bruits,  Neurologic: Alert, oriented, PERRLA, optic discs sharp and vessels normal, no hemorrhage or exudate, cranial nerves grossly normal, motor  strength 5 over 5, reflexes 1+ symmetric, upper body coordination normal, gait normal, Skin: No rash or ecchymosis  Lab Results: CBC W/Diff    Component Value Date/Time   WBC 3.0* 01/30/2015 0923   WBC 3.8* 04/04/2014 1019   RBC 4.27 01/30/2015 0923   RBC 3.81* 04/04/2014 1019   RBC 3.81* 04/04/2014 1019   HGB 11.3* 01/30/2015 0923   HGB 10.4* 04/04/2014 1019   HCT 34.9 01/30/2015 0923   HCT 31.8* 04/04/2014 1019   PLT 136* 01/30/2015 0923   PLT 122* 04/04/2014 1019   MCV 81.7 01/30/2015 0923   MCV 83.5 04/04/2014 1019   MCH 26.5 01/30/2015 0923   MCH 27.3 04/04/2014 1019   MCHC 32.4 01/30/2015 0923   MCHC 32.7 04/04/2014 1019   RDW 15.7* 01/30/2015 0923   RDW 15.5 04/04/2014 1019   LYMPHSABS 0.3* 01/30/2015 0923   LYMPHSABS 1.7 04/04/2014 1019   MONOABS 0.2 01/30/2015 0923   MONOABS 0.2 04/04/2014 1019   EOSABS 0.1 01/30/2015 0923   EOSABS 0.1 04/04/2014 1019   BASOSABS 0.0 01/30/2015 0923   BASOSABS 0.0 04/04/2014 1019     Chemistry      Component Value Date/Time   NA 139 01/30/2015 0924   NA 137 04/11/2014 1059   K 4.8 01/30/2015 0924   K 4.2 04/11/2014 1059   CL 102 04/11/2014 1059   CO2 25 01/30/2015 0924   CO2 28 04/11/2014  1059   BUN 11.5 01/30/2015 0924   BUN 14 04/11/2014 1059   CREATININE 0.8 01/30/2015 0924   CREATININE 0.85 04/11/2014 1059   CREATININE 0.99 01/20/2012 1452   CREATININE 0.91 02/06/2011 0856      Component Value Date/Time   CALCIUM 9.1 01/30/2015 0924   CALCIUM 9.1 04/11/2014 1059   ALKPHOS 77 01/30/2015 0924   ALKPHOS 65 04/11/2014 1059   AST 21 01/30/2015 0924   AST 15 04/11/2014 1059   ALT 27 01/30/2015 0924   ALT 14 04/11/2014 1059   BILITOT 0.48 01/30/2015 0924   BILITOT 0.7 04/11/2014 1059       Radiological Studies: Ct Abdomen Pelvis W Contrast  01/30/2015   CLINICAL DATA:  Patient with lymphoma. Status post chemotherapy. Prior cholecystectomy. Restaging evaluation.  EXAM: CT ABDOMEN AND PELVIS WITH CONTRAST   TECHNIQUE: Multidetector CT imaging of the abdomen and pelvis was performed using the standard protocol following bolus administration of intravenous contrast.  CONTRAST:  178m OMNIPAQUE IOHEXOL 300 MG/ML  SOLN  COMPARISON:  PET-CT 07/31/2014; PET-CT 04/19/2014; CT abdomen pelvis 03/01/2014  FINDINGS: Lower chest: Visualization of the lower thorax demonstrates no consolidative or nodular pulmonary opacities. Minimal dependent atelectasis left lower lobe. Normal heart size.  Hepatobiliary: The liver is normal in size and contour without focal hepatic lesion identified. Gallbladder surgically absent. No intrahepatic or extrahepatic biliary ductal dilatation.  Pancreas: Unremarkable  Spleen: Spleen is enlarged measuring 15.1 cm, unchanged from prior  Adrenals/Urinary Tract: Normal adrenal glands. No hydronephrosis. Kidneys enhance symmetrically with contrast. Urinary bladder is unremarkable.  Stomach/Bowel: Stool throughout the colon. No abnormal bowel wall thickening or evidence for bowel obstruction. The appendix is normal.  Vascular/Lymphatic: Normal caliber abdominal aorta. No retroperitoneal lymphadenopathy.  Other: Uterus is unremarkable. Prominent follicle within the left ovary.  Musculoskeletal: No aggressive or acute appearing osseous lesions. Lower lumbar spine degenerative changes.  IMPRESSION: Spleen measures 15.1 cm, unchanged from prior examination.  No retroperitoneal, porta hepatic or iliac adenopathy.   Electronically Signed   By: DLovey NewcomerM.D.   On: 01/30/2015 12:50    Impression:  #1. Marginal zone lymphoma presenting with initial direct Coombs positive test, then a rising IgM serum paraprotein and progressive, mild, pancytopenia with no gross bone marrow infiltrates but a positive monoclonal B-cell population. Complete metabolic response to a combination of cladribine plus Rituxan and significant reduction in spleen size from 23 to15 cm. Spleen is no longer clinically palpable on exam. Size  has remained the same on follow-up CT scan done on 01/30/2015. Plan: Continued clinical follow-up. I will get a six-month interval scan in October. In spleen size remains stable then subsequence scans only on a as-needed basis. IgM paraprotein has been useful to follow the activity of her disease.  #2. 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  #3. Elevated IgM paraprotein-paraneoplastic manifestation of #1.  #4. Iron deficiency anemia secondary to menorrhagia.  #5. History of ulcerative esophagitis   CC: Patient Care Team: DSofie Hartigan MD as PCP - General (Family Medicine)   JAnnia Belt MD 5/2/201610:58 AM

## 2015-02-13 ENCOUNTER — Telehealth: Payer: Self-pay | Admitting: Oncology

## 2015-02-13 LAB — IGG, IGA, IGM
IGA: 171 mg/dL (ref 69–380)
IGG (IMMUNOGLOBIN G), SERUM: 636 mg/dL — AB (ref 690–1700)
IgM, Serum: 56 mg/dL (ref 52–322)

## 2015-02-13 LAB — DIRECT ANTIGLOBULIN TEST (NOT AT ARMC)
DAT (Complement): NEGATIVE
DAT IGG: NEGATIVE

## 2015-02-13 LAB — PATHOLOGIST SMEAR REVIEW

## 2015-02-13 NOTE — Addendum Note (Signed)
Addended by: Annia Belt on: 02/13/2015 08:20 PM   Modules accepted: Medications

## 2015-02-13 NOTE — Progress Notes (Signed)
Patient ID: Kara Perkins, female   DOB: 02/19/1971, 44 y.o.   MRN: [id number] Lab work unexpectedly shows acute fall in her white count with total white count 0.9, 18% neutrophils, 44% lymphocytes, 30% monocytes, fall in hemoglobin from 11.3 down to 9.4, rise in platelet count from 136,000 to 170,000. Serum IgM level further decreased down to 56 mg percent and no immature cells seen on review of the peripheral blood film suggesting that this is a chemotherapy or Rituxan effect and not recurrent marginal zone lymphoma. I will call the patient and discuss. For now, infection precautions and monitor blood counts. If this is an idiopathic side effect of Rituxan, which I have seen in some other patients, it may take months to resolve.

## 2015-02-13 NOTE — Telephone Encounter (Signed)
Per 5/3 pof from [place] patient to have lab/ct in October. Las scheduled @ [place] @ [facility] 10/17 and central radiology schedulers will call patient with ct. Left message for patient. Schedule mailed.

## 2015-02-14 ENCOUNTER — Encounter: Payer: Self-pay | Admitting: Oncology

## 2015-02-14 ENCOUNTER — Other Ambulatory Visit: Payer: Self-pay | Admitting: Oncology

## 2015-02-14 DIAGNOSIS — D72819 Decreased white blood cell count, unspecified: Secondary | ICD-10-CM

## 2015-02-14 LAB — IMMUNOFIXATION ELECTROPHORESIS
IGG (IMMUNOGLOBIN G), SERUM: 636 mg/dL — AB (ref 690–1700)
IGM, SERUM: 56 mg/dL (ref 52–322)
IgA: 171 mg/dL (ref 69–380)
Total Protein, Serum Electrophoresis: 6 g/dL (ref 6.0–8.3)

## 2015-02-14 NOTE — Progress Notes (Unsigned)
Patient ID: Kara Perkins, female   DOB: 11/18/1970, 44 y.o.   MRN: [id number] I called the patient last evening when CBC unexpectedly showed a sudden dramatic fall in her total white count 0.9 with 18% neutrophils. Please see addendum to my office note dated 02/12/2015.

## 2015-03-01 ENCOUNTER — Telehealth: Payer: Self-pay | Admitting: Oncology

## 2015-03-01 NOTE — Telephone Encounter (Signed)
Call to patient to confirm appointment for 03/05/15 at 9:30 lmtcb

## 2015-03-05 ENCOUNTER — Other Ambulatory Visit (INDEPENDENT_AMBULATORY_CARE_PROVIDER_SITE_OTHER): Payer: Commercial Managed Care - PPO

## 2015-03-05 DIAGNOSIS — D72819 Decreased white blood cell count, unspecified: Secondary | ICD-10-CM | POA: Diagnosis not present

## 2015-03-05 LAB — CBC WITH DIFFERENTIAL/PLATELET
BASOS ABS: 0 10*3/uL (ref 0.0–0.1)
Basophils Relative: 0 % (ref 0–1)
EOS PCT: 3 % (ref 0–5)
Eosinophils Absolute: 0.1 10*3/uL (ref 0.0–0.7)
HCT: 32.3 % — ABNORMAL LOW (ref 36.0–46.0)
Hemoglobin: 10.5 g/dL — ABNORMAL LOW (ref 12.0–15.0)
LYMPHS PCT: 19 % (ref 12–46)
Lymphs Abs: 0.5 10*3/uL — ABNORMAL LOW (ref 0.7–4.0)
MCH: 25.9 pg — AB (ref 26.0–34.0)
MCHC: 32.5 g/dL (ref 30.0–36.0)
MCV: 79.6 fL (ref 78.0–100.0)
MONO ABS: 0.2 10*3/uL (ref 0.1–1.0)
MONOS PCT: 8 % (ref 3–12)
MPV: 9.8 fL (ref 8.6–12.4)
NEUTROS PCT: 70 % (ref 43–77)
Neutro Abs: 1.7 10*3/uL (ref 1.7–7.7)
PLATELETS: 147 10*3/uL — AB (ref 150–400)
RBC: 4.06 MIL/uL (ref 3.87–5.11)
RDW: 16.7 % — AB (ref 11.5–15.5)
WBC: 2.4 10*3/uL — AB (ref 4.0–10.5)

## 2015-03-05 LAB — SAVE SMEAR

## 2015-03-06 ENCOUNTER — Telehealth: Payer: Self-pay | Admitting: *Deleted

## 2015-03-06 NOTE — Telephone Encounter (Signed)
Pt called / informed white count is improving - up from 0.9 to 2.4 per Dr Beryle Beams ; repeat in 1 month; she has an appt already on 6/27.

## 2015-03-06 NOTE — Telephone Encounter (Signed)
-----   Message from Annia Belt, MD sent at 03/06/2015  6:55 AM EDT ----- Call pt: white count improving: up from 900 to 2,400; repeat in 1 month

## 2015-04-09 ENCOUNTER — Other Ambulatory Visit (INDEPENDENT_AMBULATORY_CARE_PROVIDER_SITE_OTHER): Payer: Commercial Managed Care - PPO

## 2015-04-09 DIAGNOSIS — D72819 Decreased white blood cell count, unspecified: Secondary | ICD-10-CM | POA: Diagnosis not present

## 2015-04-09 LAB — CBC WITH DIFFERENTIAL/PLATELET
BASOS ABS: 0 10*3/uL (ref 0.0–0.1)
Basophils Relative: 0 % (ref 0–1)
EOS PCT: 3 % (ref 0–5)
Eosinophils Absolute: 0.1 10*3/uL (ref 0.0–0.7)
HCT: 30.5 % — ABNORMAL LOW (ref 36.0–46.0)
Hemoglobin: 9.8 g/dL — ABNORMAL LOW (ref 12.0–15.0)
LYMPHS PCT: 14 % (ref 12–46)
Lymphs Abs: 0.5 10*3/uL — ABNORMAL LOW (ref 0.7–4.0)
MCH: 25.7 pg — ABNORMAL LOW (ref 26.0–34.0)
MCHC: 32.1 g/dL (ref 30.0–36.0)
MCV: 80.1 fL (ref 78.0–100.0)
MONO ABS: 0.3 10*3/uL (ref 0.1–1.0)
MPV: 9.8 fL (ref 8.6–12.4)
Monocytes Relative: 9 % (ref 3–12)
Neutro Abs: 2.7 10*3/uL (ref 1.7–7.7)
Neutrophils Relative %: 74 % (ref 43–77)
Platelets: 169 10*3/uL (ref 150–400)
RBC: 3.81 MIL/uL — ABNORMAL LOW (ref 3.87–5.11)
RDW: 17 % — AB (ref 11.5–15.5)
WBC: 3.7 10*3/uL — AB (ref 4.0–10.5)

## 2015-04-11 ENCOUNTER — Telehealth: Payer: Self-pay | Admitting: *Deleted

## 2015-04-11 NOTE — Telephone Encounter (Signed)
Pt called and informed ok for teeth cleaning per Dr Beryle Beams.

## 2015-04-11 NOTE — Telephone Encounter (Signed)
-----   Message from Annia Belt, MD sent at 04/10/2015  7:59 AM EDT ----- Call pt: blood counts improving both white count and platelets better; hemoglobin down insignificant amount since checked last month.

## 2015-04-11 NOTE — Telephone Encounter (Addendum)
Pt called /informed blood counts are improving both WBC and Platelets; Hgb down from last month per Dr Beryle Beams. Informed Hgb @ 9.8, WBC@ 3.7 and Platelets @169 .  She is scheduled to have teeth cleaned next week; wants to know if it's ok? Thnaks

## 2015-04-11 NOTE — Telephone Encounter (Signed)
-----   Message from Annia Belt, MD sent at 04/11/2015  4:16 PM EDT ----- yes ----- Message -----    From: Ebbie Latus, RN    Sent: 04/11/2015   1:59 PM      To: Annia Belt, MD  She wants to know if it's ok for teeth cleaning; appt next week? Thanks GP

## 2015-05-07 ENCOUNTER — Other Ambulatory Visit (INDEPENDENT_AMBULATORY_CARE_PROVIDER_SITE_OTHER): Payer: Commercial Managed Care - PPO

## 2015-05-07 DIAGNOSIS — D72819 Decreased white blood cell count, unspecified: Secondary | ICD-10-CM

## 2015-05-07 LAB — CBC WITH DIFFERENTIAL/PLATELET
BASOS ABS: 0 10*3/uL (ref 0.0–0.1)
Basophils Relative: 0 % (ref 0–1)
EOS ABS: 0.1 10*3/uL (ref 0.0–0.7)
Eosinophils Relative: 3 % (ref 0–5)
HCT: 28.5 % — ABNORMAL LOW (ref 36.0–46.0)
Hemoglobin: 9.1 g/dL — ABNORMAL LOW (ref 12.0–15.0)
Lymphocytes Relative: 14 % (ref 12–46)
Lymphs Abs: 0.6 10*3/uL — ABNORMAL LOW (ref 0.7–4.0)
MCH: 25.3 pg — AB (ref 26.0–34.0)
MCHC: 31.9 g/dL (ref 30.0–36.0)
MCV: 79.2 fL (ref 78.0–100.0)
MONOS PCT: 9 % (ref 3–12)
MPV: 9.9 fL (ref 8.6–12.4)
Monocytes Absolute: 0.4 10*3/uL (ref 0.1–1.0)
NEUTROS ABS: 3 10*3/uL (ref 1.7–7.7)
Neutrophils Relative %: 74 % (ref 43–77)
PLATELETS: 150 10*3/uL (ref 150–400)
RBC: 3.6 MIL/uL — ABNORMAL LOW (ref 3.87–5.11)
RDW: 16.3 % — AB (ref 11.5–15.5)
WBC: 4 10*3/uL (ref 4.0–10.5)

## 2015-05-08 ENCOUNTER — Other Ambulatory Visit: Payer: Self-pay | Admitting: Oncology

## 2015-05-08 DIAGNOSIS — D509 Iron deficiency anemia, unspecified: Secondary | ICD-10-CM

## 2015-05-08 DIAGNOSIS — C8307 Small cell B-cell lymphoma, spleen: Secondary | ICD-10-CM

## 2015-05-08 DIAGNOSIS — D472 Monoclonal gammopathy: Secondary | ICD-10-CM

## 2015-05-09 ENCOUNTER — Telehealth: Payer: Self-pay | Admitting: *Deleted

## 2015-05-09 ENCOUNTER — Other Ambulatory Visit: Payer: Self-pay | Admitting: *Deleted

## 2015-05-09 DIAGNOSIS — D509 Iron deficiency anemia, unspecified: Secondary | ICD-10-CM

## 2015-05-09 MED ORDER — POLYSACCHARIDE IRON COMPLEX 150 MG PO CAPS: 150.0000 mg | ORAL_CAPSULE | Freq: Two times a day (BID) | ORAL | Status: DC

## 2015-05-09 NOTE — Telephone Encounter (Signed)
-----   Message from Annia Belt, MD sent at 05/08/2015  5:06 PM EDT ----- Call pt: white count and platelets now both normal. Hemoglobin slowly drifting down - looks like iron deficiency.  Would begin OTC iron supplement: ferrous sulfate 3x daily. Repeat lab again in 2 months. I will check antibody level again as well.

## 2015-05-09 NOTE — Telephone Encounter (Signed)
Pt called / informed WBC and Platelets are both normal; Hgb drifting downward @ 9.1 "looks like iron deficiency", need OTC iron per Dr Beryle Beams. States she takes Niferex 150 mg, which she's out of and easier on her stomach than OTC iron. She has a lab appt already scheduled on 8/22.  Talked to [facility] - stated to keep her scheduled lab appt and he will refill Nerex rx. Pt called and informed.

## 2015-05-09 NOTE — Telephone Encounter (Signed)
Returned pt's call -  Pt wanted to know date/time of her appts; CT/lab on 10/17@ 0900 AM and sees Dr Beryle Beams 10/24 @ [id number].

## 2015-05-09 NOTE — Telephone Encounter (Signed)
Niferex rx called to [facility].

## 2015-06-04 ENCOUNTER — Other Ambulatory Visit (INDEPENDENT_AMBULATORY_CARE_PROVIDER_SITE_OTHER): Payer: Commercial Managed Care - PPO

## 2015-06-04 DIAGNOSIS — D72819 Decreased white blood cell count, unspecified: Secondary | ICD-10-CM

## 2015-06-05 ENCOUNTER — Telehealth: Payer: Self-pay | Admitting: *Deleted

## 2015-06-05 LAB — CBC WITH DIFFERENTIAL/PLATELET
Basophils Absolute: 0 10*3/uL (ref 0.0–0.2)
Basos: 0 %
EOS (ABSOLUTE): 0.1 10*3/uL (ref 0.0–0.4)
Eos: 2 %
HEMATOCRIT: 31.9 % — AB (ref 34.0–46.6)
Hemoglobin: 9.8 g/dL — ABNORMAL LOW (ref 11.1–15.9)
Immature Grans (Abs): 0 10*3/uL (ref 0.0–0.1)
Immature Granulocytes: 0 %
LYMPHS ABS: 0.6 10*3/uL — AB (ref 0.7–3.1)
Lymphs: 15 %
MCH: 24.3 pg — ABNORMAL LOW (ref 26.6–33.0)
MCHC: 30.7 g/dL — AB (ref 31.5–35.7)
MCV: 79 fL (ref 79–97)
Monocytes Absolute: 0.2 10*3/uL (ref 0.1–0.9)
Monocytes: 6 %
Neutrophils Absolute: 2.8 10*3/uL (ref 1.4–7.0)
Neutrophils: 77 %
Platelets: 180 10*3/uL (ref 150–379)
RBC: 4.04 x10E6/uL (ref 3.77–5.28)
RDW: 16.8 % — ABNORMAL HIGH (ref 12.3–15.4)
WBC: 3.7 10*3/uL (ref 3.4–10.8)

## 2015-06-05 NOTE — Telephone Encounter (Signed)
-----   Message from Kara Belt, MD sent at 06/05/2015 12:34 PM EDT ----- Call pt: CBC stable compared with last time

## 2015-06-05 NOTE — Telephone Encounter (Signed)
Pt called / informed CBC stable as compared w/last time per Dr Beryle Beams. Pt asked about WBC level which is 3.7; last time 4.0. Also asked what was her last IGM level - 5.6 on 02/12/15.

## 2015-06-06 NOTE — Telephone Encounter (Signed)
Pt asked yesterday about when is her CT scan; ordered to be done in Oct - needs to be pre-authorized within 30 days. Once pre-authorized, we will give her a call. Called pt - no answer; left message of the above.

## 2015-06-07 ENCOUNTER — Other Ambulatory Visit: Payer: Self-pay | Admitting: Oncology

## 2015-06-07 DIAGNOSIS — C8307 Small cell B-cell lymphoma, spleen: Secondary | ICD-10-CM

## 2015-06-20 ENCOUNTER — Telehealth: Payer: Self-pay | Admitting: *Deleted

## 2015-06-20 NOTE — Telephone Encounter (Signed)
Pt called /informed CT of Abd/Pelvis and chest scheduled on 10/20 @ 1300PM here at [facility]; arrive @1245PM  and needs to pick-up contrast prior to CT. Pt voiced understanding.

## 2015-07-30 ENCOUNTER — Other Ambulatory Visit (INDEPENDENT_AMBULATORY_CARE_PROVIDER_SITE_OTHER): Payer: Commercial Managed Care - PPO

## 2015-07-30 ENCOUNTER — Other Ambulatory Visit: Payer: Commercial Managed Care - PPO

## 2015-07-30 DIAGNOSIS — D509 Iron deficiency anemia, unspecified: Secondary | ICD-10-CM

## 2015-07-30 DIAGNOSIS — C8307 Small cell B-cell lymphoma, spleen: Secondary | ICD-10-CM

## 2015-07-30 DIAGNOSIS — D472 Monoclonal gammopathy: Secondary | ICD-10-CM | POA: Diagnosis not present

## 2015-07-31 LAB — CBC WITH DIFFERENTIAL/PLATELET
BASOS ABS: 0 10*3/uL (ref 0.0–0.2)
Basos: 0 %
EOS (ABSOLUTE): 0.1 10*3/uL (ref 0.0–0.4)
Eos: 3 %
Hematocrit: 30 % — ABNORMAL LOW (ref 34.0–46.6)
Hemoglobin: 9.8 g/dL — ABNORMAL LOW (ref 11.1–15.9)
IMMATURE GRANS (ABS): 0 10*3/uL (ref 0.0–0.1)
IMMATURE GRANULOCYTES: 0 %
LYMPHS: 12 %
Lymphocytes Absolute: 0.5 10*3/uL — ABNORMAL LOW (ref 0.7–3.1)
MCH: 27.1 pg (ref 26.6–33.0)
MCHC: 32.7 g/dL (ref 31.5–35.7)
MCV: 83 fL (ref 79–97)
Monocytes Absolute: 0.2 10*3/uL (ref 0.1–0.9)
Monocytes: 5 %
Neutrophils Absolute: 3.4 10*3/uL (ref 1.4–7.0)
Neutrophils: 80 %
PLATELETS: 167 10*3/uL (ref 150–379)
RBC: 3.62 x10E6/uL — ABNORMAL LOW (ref 3.77–5.28)
RDW: 17 % — ABNORMAL HIGH (ref 12.3–15.4)
WBC: 4.3 10*3/uL (ref 3.4–10.8)

## 2015-07-31 LAB — CMP14 + ANION GAP
ALBUMIN: 4 g/dL (ref 3.5–5.5)
ALT: 17 IU/L (ref 0–32)
AST: 18 IU/L (ref 0–40)
Albumin/Globulin Ratio: 1.7 (ref 1.1–2.5)
Alkaline Phosphatase: 72 IU/L (ref 39–117)
Anion Gap: 16 mmol/L (ref 10.0–18.0)
BILIRUBIN TOTAL: 0.3 mg/dL (ref 0.0–1.2)
BUN / CREAT RATIO: 16 (ref 9–23)
BUN: 12 mg/dL (ref 6–24)
CHLORIDE: 101 mmol/L (ref 97–106)
CO2: 22 mmol/L (ref 18–29)
CREATININE: 0.73 mg/dL (ref 0.57–1.00)
Calcium: 8.5 mg/dL — ABNORMAL LOW (ref 8.7–10.2)
GFR calc non Af Amer: 100 mL/min/{1.73_m2} (ref >59)
GFR, EST AFRICAN AMERICAN: 116 mL/min/{1.73_m2} (ref >59)
GLOBULIN, TOTAL: 2.3 g/dL (ref 1.5–4.5)
Glucose: 102 mg/dL — ABNORMAL HIGH (ref 65–99)
Potassium: 3.9 mmol/L (ref 3.5–5.2)
SODIUM: 139 mmol/L (ref 136–144)
TOTAL PROTEIN: 6.3 g/dL (ref 6.0–8.5)

## 2015-07-31 LAB — IGG, IGA, IGM
IgA/Immunoglobulin A, Serum: 187 mg/dL (ref 87–352)
IgG (Immunoglobin G), Serum: 676 mg/dL — ABNORMAL LOW (ref 700–1600)
IgM (Immunoglobulin M), Srm: 58 mg/dL (ref 26–217)

## 2015-07-31 LAB — IRON AND TIBC
IRON: 30 ug/dL (ref 27–159)
Iron Saturation: 10 % — ABNORMAL LOW (ref 15–55)
Total Iron Binding Capacity: 309 ug/dL (ref 250–450)
UIBC: 279 ug/dL (ref 131–425)

## 2015-07-31 LAB — LACTATE DEHYDROGENASE: LDH: 155 IU/L (ref 119–226)

## 2015-07-31 LAB — FERRITIN: FERRITIN: 17 ng/mL (ref 15–150)

## 2015-08-01 ENCOUNTER — Telehealth: Payer: Self-pay | Admitting: *Deleted

## 2015-08-01 NOTE — Telephone Encounter (Signed)
Pt called / informed Hgb stable @ 9.8; IgM level remains normal @ 58; and Ferritin level low normal @ 17 so stay on iron tablets per Dr Beryle Beams.

## 2015-08-01 NOTE — Telephone Encounter (Signed)
-----   Message from Annia Belt, MD sent at 07/31/2015 11:49 AM EDT ----- Call pt: Hb stable @ 9.8. IgM level remains normal. Ferritin low normal: stay on po iron

## 2015-08-02 ENCOUNTER — Encounter (HOSPITAL_COMMUNITY): Payer: Self-pay

## 2015-08-02 ENCOUNTER — Ambulatory Visit (HOSPITAL_COMMUNITY)
Admission: RE | Admit: 2015-08-02 | Discharge: 2015-08-02 | Disposition: A | Discharge status: Home / Self Care | Payer: Commercial Managed Care - PPO | Source: Ambulatory Visit | Attending: Oncology | Admitting: Oncology

## 2015-08-02 DIAGNOSIS — C8307 Small cell B-cell lymphoma, spleen: Secondary | ICD-10-CM | POA: Insufficient documentation

## 2015-08-02 DIAGNOSIS — R161 Splenomegaly, not elsewhere classified: Secondary | ICD-10-CM | POA: Insufficient documentation

## 2015-08-02 DIAGNOSIS — R918 Other nonspecific abnormal finding of lung field: Secondary | ICD-10-CM | POA: Diagnosis not present

## 2015-08-02 DIAGNOSIS — I7090 Unspecified atherosclerosis: Secondary | ICD-10-CM | POA: Diagnosis not present

## 2015-08-02 MED ORDER — IOHEXOL 300 MG/ML  SOLN
100.0000 mL | Freq: Once | INTRAMUSCULAR | Status: AC | PRN
  Administered 2015-08-02: 100 mL via INTRAVENOUS

## 2015-08-06 ENCOUNTER — Telehealth: Payer: Self-pay | Admitting: *Deleted

## 2015-08-06 ENCOUNTER — Encounter: Payer: Self-pay | Admitting: Oncology

## 2015-08-06 ENCOUNTER — Ambulatory Visit (INDEPENDENT_AMBULATORY_CARE_PROVIDER_SITE_OTHER): Payer: Commercial Managed Care - PPO | Admitting: Oncology

## 2015-08-06 VITALS — BP 126/72 | HR 78 | Temp 98.1°F | Ht 67.0 in | Wt 204.1 lb

## 2015-08-06 DIAGNOSIS — C8307 Small cell B-cell lymphoma, spleen: Secondary | ICD-10-CM | POA: Diagnosis not present

## 2015-08-06 DIAGNOSIS — J209 Acute bronchitis, unspecified: Secondary | ICD-10-CM | POA: Diagnosis not present

## 2015-08-06 DIAGNOSIS — R768 Other specified abnormal immunological findings in serum: Secondary | ICD-10-CM | POA: Diagnosis not present

## 2015-08-06 DIAGNOSIS — D509 Iron deficiency anemia, unspecified: Secondary | ICD-10-CM

## 2015-08-06 MED ORDER — AZITHROMYCIN 250 MG PO TABS: ORAL_TABLET | ORAL | Status: AC

## 2015-08-06 NOTE — Telephone Encounter (Signed)
Pt here this morning for her appt to see Dr Beryle Beams.

## 2015-08-06 NOTE — Progress Notes (Signed)
Patient ID: Kara Perkins, female   DOB: Mar 13, 1971, 44 y.o.   MRN: [id number] Hematology and Oncology Follow Up Visit  MACLAINE AHOLA [id number] 10/29/1970 44 y.o. 08/06/2015 10:20 AM   Principle Diagnosis: Encounter Diagnoses  Name Primary?  . Positive Coombs test   . Lymphoma, marginal zone, spleen (HCC) Yes  . Iron deficiency anemia   Clinical Summary: 44 year-old woman I first saw when she was pregnant back in 2008 and was found to have a positive direct Coombs' test. She never demonstrated any signs of hemolysis. She was followed closely through the pregnancy and blood counts remained stable. Over the years, the positive Coombs test extinguished. As part of her initial evaluation, serum immunoglobulins were done. She had a mild elevation of IgM. No concomitant suppression of her other immunoglobulins. I had forgotten about this until she had some GI problems back in March of 2011 when she developed acute ulcerative esophagitis. As part of her GI evaluation immunoglobulin studies were done and she showed a persistent elevation of IgM. She was reevaluated. Initial value for IgM was 425 mg percent compared with 530 mg percent done by her gastroenterologist. IgM levels monitored more closely since that time. There was a slow but steady trend for the IgM levels to be going up. A single 24-hour total urine protein was elevated at 667 mg in a pattern of nonselective proteinuria on 12/30/2009 but this was not reproducible. Subsequent study done 07/17/2010 with total urine protein on 89 mg, on 02/06/2011 18 mg. There is a monoclonal IgM kappa paraprotein in the urine as well as serum. In addition to the slow rise in her IgM, there was also a trend for progressive anemia, intermittent leukopenia, and intermittent fall in her platelet count but no single platelet count has been less than 120,000. Interpreting her hemoglobin has been complicated by the fact that she is chronically iron deficient  from menorrhagia.  CT scan of the abdomen and pelvis done 01/02/2010 did not show any lymphadenopathy or splenomegaly at that time. I did a bone marrow aspiration and biopsy on 12/11/2011. There was no evidence for either Waldenstrm's or multiple myeloma on that specimen.  She remained asymptomatic. However, lab done in May, 2015 showed progressive leukopenia with total white count 3,100, 44% neutrophils, 8% lymphocytes, I percent monocytes, 3 eosinophils. Platelet count down from 141,000 and October 2014 to 120,000 . Hemoglobin has come up from 8.7 g to 9.8 g on oral iron replacement.  I detected splenomegaly 5 cm below left costal margin on my 02/21/2014 exam. This was confirmed on a CT scan which showed spleen measuring 23 cm. Repeat bone marrow aspiration and biopsy done 03/23/2014 showed a monoclonal population of B lymphocytes expressing CD20 negative for CD5 and CD20 5. No gross lymphoid aggregates or infiltrates. 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 I reviewed treatment options at length with the patient and her husband. I recommended splenectomy as my first choice as opposed to chemotherapy. However the patient elected to take a chemotherapy program. Treatment administered by Dr. Heath Lark. She completed a course of treatment with cladribine 0.1 mg per kilogram IV plus Rituxan 375 mg per meter squared both given weekly x6 weeks between July 16 and 06/01/2014 She had no clinical lymphadenopathy but a staging PET scan done on 04/19/2014 other than showing an enlarged, hypermetabolic, spleen 23 cm in maximum dimension, also showed activity in bilateral axillary lymph glands and in addition, normal sized but hypermetabolic lymph glands along the  chest wall, in the upper abdomen, and pelvis. She tolerated treatments well. She developed transient mild leukopenia with lowest white count 2200, and mild thrombocytopenia with lowest platelet count 110,000. White count  recovered to 4000 with 80% neutrophils, and platelets 162,000 as of 07/31/2014. She did not get any infections. She did not require hospitalization. IgM paraprotein normalized from peak pretreatment value of 1130 mg percent to most recent value of 192 on 07/31/14 . PET scan showed complete metabolic response in all lymph node areas and the spleen, but persistent mild splenomegaly but reduction down from 23 cm to 15 cm.  Interim History:   At time of visit here in May, she developed asymptomatic leukopenia with total WBC as low as 0.9 recorded 02/12/15. Counts were monitored closely over the next few months and have subsequently improved with WBC up to 4,300 with 80% neutrophils on 07/30/15. This was either a delayed effect of the chemotherapy or the results of Rituxan which is known to cause a transient leukopenia on an immune basis in some patients. She did not get any infections while her counts were down but recently contracted a persistent bronchitis from her daughter. Husband also got it. It took her daughter 3 weeks to get better. The patient has now had her cough for 4 weeks. She has had some improvement but is still coughing and now sputum has become purulent. We got into a very emotional discussion about toxic waste that has been dumped in her neighborhood for many years. She believes that it may be a precipitating factor resulting in her lymphoma. Other people in her neighborhood have gotten very ill and one person died possibly related to inhalation of toxic fumes. She was very insistent that I look up information on the Internet for her. She mentioned a doctor Junious Dresser and a sewage sludge action network. I did a preliminary computer search while she was in the exam room. There are a number of articles that do link toxic waste to lymphoma but I will need to get more details. On a brighter note, she remains in a continuous remission now out 14 months from initiation of treatment for her  marginal zone lymphoma with primarily spleen involvement and some scattered borderline adenopathy in the thorax. Lymph nodes have completely resolved. PET scan at the completion of therapy was negative in all areas. Spleen remains mildly enlarged with a maximum dimension of 15 cm down from 23 cm prior to treatment but unchanged compared with most recent study done 01/30/2015. IgM levels done in anticipation of today's visit on October 17 remain low normal. (58 mg percent) She has had no other interim medical problems. She has a persistent microcytic anemia and is on oral iron replacement. Ferritin done in anticipation of today's visit now in the low normal range and I encouraged her to continue the iron supplements.  Medications: reviewed  Allergies: No Known Allergies  Review of Systems: See HPI  Remaining ROS negative:   Physical Exam: Blood pressure 126/72, pulse 78, temperature 98.1 F (36.7 C), temperature source Oral, height '5\' 7"'  (1.702 m), weight 204 lb 1.6 oz (92.579 kg), last menstrual period 07/10/2015, SpO2 100 %. Wt Readings from Last 3 Encounters:  08/06/15 204 lb 1.6 oz (92.579 kg)  02/12/15 205 lb 11.2 oz (93.305 kg)  08/07/14 201 lb 11.2 oz (91.491 kg)     General appearance: well nourished caucasian woman HENNT: Pharynx no erythema, exudate, mass, or ulcer. No thyromegaly or thyroid nodules Lymph nodes: No  cervical, supraclavicular, or axillary lymphadenopathy Breasts:  Lungs: Clear to auscultation, resonant to percussion throughout Heart: Regular rhythm, no murmur, no gallop, no rub, no click, no edema Abdomen: Soft, nontender, normal bowel sounds, no mass, questionable spleen tip palpable LUQ Extremities: No edema, no calf tenderness Musculoskeletal: no joint deformities GU:  Vascular: Carotid pulses 2+, no bruits,  Neurologic: Alert, oriented, PERRLA, , cranial nerves grossly normal, motor strength 5 over 5, reflexes 1+ symmetric, upper body coordination normal,  gait normal, Skin: No rash or ecchymosis  Lab Results: CBC W/Diff    Component Value Date/Time   WBC 4.3 07/30/2015 0835   WBC 4.0 05/07/2015 1330   WBC 3.0* 01/30/2015 0923   RBC 3.62* 07/30/2015 0835   RBC 3.60* 05/07/2015 1330   RBC 3.60* 02/12/2015 0922   RBC 4.27 01/30/2015 0923   HGB 9.1* 05/07/2015 1330   HGB 11.3* 01/30/2015 0923   HCT 30.0* 07/30/2015 0835   HCT 28.5* 05/07/2015 1330   HCT 34.9 01/30/2015 0923   PLT 150 05/07/2015 1330   PLT 136* 01/30/2015 0923   MCV 79.2 05/07/2015 1330   MCV 81.7 01/30/2015 0923   MCH 27.1 07/30/2015 0835   MCH 25.3* 05/07/2015 1330   MCH 26.5 01/30/2015 0923   MCHC 32.7 07/30/2015 0835   MCHC 31.9 05/07/2015 1330   MCHC 32.4 01/30/2015 0923   RDW 17.0* 07/30/2015 0835   RDW 16.3* 05/07/2015 1330   RDW 15.7* 01/30/2015 0923   LYMPHSABS 0.5* 07/30/2015 0835   LYMPHSABS 0.6* 05/07/2015 1330   LYMPHSABS 0.3* 01/30/2015 0923   MONOABS 0.4 05/07/2015 1330   MONOABS 0.2 01/30/2015 0923   EOSABS 0.1 05/07/2015 1330   EOSABS 0.1 01/30/2015 0923   BASOSABS 0.0 07/30/2015 0835   BASOSABS 0.0 05/07/2015 1330   BASOSABS 0.0 01/30/2015 0923     Chemistry      Component Value Date/Time   NA 139 07/30/2015 0835   NA 139 01/30/2015 0924   NA 137 04/11/2014 1059   K 3.9 07/30/2015 0835   K 4.8 01/30/2015 0924   CL 101 07/30/2015 0835   CO2 22 07/30/2015 0835   CO2 25 01/30/2015 0924   BUN 12 07/30/2015 0835   BUN 11.5 01/30/2015 0924   BUN 14 04/11/2014 1059   CREATININE 0.73 07/30/2015 0835   CREATININE 0.8 01/30/2015 0924   CREATININE 0.85 04/11/2014 1059   CREATININE 0.91 02/06/2011 0856      Component Value Date/Time   CALCIUM 8.5* 07/30/2015 0835   CALCIUM 9.1 01/30/2015 0924   ALKPHOS 72 07/30/2015 0835   ALKPHOS 77 01/30/2015 0924   AST 18 07/30/2015 0835   AST 21 01/30/2015 0924   ALT 17 07/30/2015 0835   ALT 27 01/30/2015 0924   BILITOT 0.3 07/30/2015 0835   BILITOT 0.48 01/30/2015 0924   BILITOT 0.7  04/11/2014 1059       Radiological Studies: Ct Chest W Contrast  08/02/2015  CLINICAL DATA:  Subsequent evaluation of a 44 year old female with history of lymphoma originally diagnosed in April 2015. Restaging examination. EXAM: CT CHEST, ABDOMEN, AND PELVIS WITH CONTRAST TECHNIQUE: Multidetector CT imaging of the chest, abdomen and pelvis was performed following the standard protocol during bolus administration of intravenous contrast. CONTRAST:  123m OMNIPAQUE IOHEXOL 300 MG/ML  SOLN COMPARISON:  PET-CT 07/31/2014. FINDINGS: CT CHEST FINDINGS Mediastinum/Nodes: Heart size is normal. There is no significant pericardial fluid, thickening or pericardial calcification. No pathologically enlarged mediastinal or hilar lymph nodes. Esophagus is unremarkable in appearance. No axillary  lymphadenopathy. Lungs/Pleura: Area of volume loss, architectural distortion and some focal airspace consolidation with air bronchograms in the inferior aspect of the right upper lobe, new compared to prior studies. Lungs are otherwise clear. No pleural effusions. No suspicious appearing pulmonary nodules or masses. Musculoskeletal: There are no aggressive appearing lytic or blastic lesions noted in the visualized portions of the skeleton. CT ABDOMEN PELVIS FINDINGS Hepatobiliary: No cystic or solid hepatic lesions. No intra or extrahepatic biliary ductal dilatation. Gallbladder is surgically absent. Pancreas: No pancreatic mass. No pancreatic ductal dilatation. No pancreatic or peripancreatic fluid or inflammatory changes. Spleen: Spleen is mildly enlarged measuring 11.1 x 5.9 x 15.2 cm (estimated splenic volume of 498 mL). Adrenals/Urinary Tract: Bilateral kidneys and bilateral adrenal glands are normal in appearance. No hydroureteronephrosis. Urinary bladder is normal in appearance. Stomach/Bowel: Normal appearance of the stomach. No pathologic dilatation of small bowel or colon. Normal appendix. Vascular/Lymphatic: Minimal  atherosclerotic disease is noted in the abdominal and pelvic vasculature, without evidence of aneurysm or dissection. No lymphadenopathy noted in the abdomen or pelvis. Reproductive: Uterus and ovaries are unremarkable in appearance. Other: No significant volume of ascites.  No pneumoperitoneum. Musculoskeletal: There are no aggressive appearing lytic or blastic lesions noted in the visualized portions of the skeleton. IMPRESSION: 1. Mild splenomegaly, similar to the prior examination. The possibility of residual lymphoma in the spleen is not excluded, but the stability compared to the prior examination is reassuring. 2. No lymphadenopathy noted in the chest, abdomen or pelvis. 3. Interval development of some volume loss, airspace consolidation and architectural distortion in the inferior aspect of the right upper lobe. If the patient has undergone prior thoracic radiation therapy, these findings could certainly be related to prior treatment. Alternatively, clinical correlation for signs and symptoms of pneumonia is recommended. 4. Additional incidental findings, as above. Electronically Signed   By: Vinnie Langton M.D.   On: 08/02/2015 16:04   Ct Abdomen Pelvis W Contrast  08/02/2015  CLINICAL DATA:  Subsequent evaluation of a 44 year old female with history of lymphoma originally diagnosed in April 2015. Restaging examination. EXAM: CT CHEST, ABDOMEN, AND PELVIS WITH CONTRAST TECHNIQUE: Multidetector CT imaging of the chest, abdomen and pelvis was performed following the standard protocol during bolus administration of intravenous contrast. CONTRAST:  147m OMNIPAQUE IOHEXOL 300 MG/ML  SOLN COMPARISON:  PET-CT 07/31/2014. FINDINGS: CT CHEST FINDINGS Mediastinum/Nodes: Heart size is normal. There is no significant pericardial fluid, thickening or pericardial calcification. No pathologically enlarged mediastinal or hilar lymph nodes. Esophagus is unremarkable in appearance. No axillary lymphadenopathy.  Lungs/Pleura: Area of volume loss, architectural distortion and some focal airspace consolidation with air bronchograms in the inferior aspect of the right upper lobe, new compared to prior studies. Lungs are otherwise clear. No pleural effusions. No suspicious appearing pulmonary nodules or masses. Musculoskeletal: There are no aggressive appearing lytic or blastic lesions noted in the visualized portions of the skeleton. CT ABDOMEN PELVIS FINDINGS Hepatobiliary: No cystic or solid hepatic lesions. No intra or extrahepatic biliary ductal dilatation. Gallbladder is surgically absent. Pancreas: No pancreatic mass. No pancreatic ductal dilatation. No pancreatic or peripancreatic fluid or inflammatory changes. Spleen: Spleen is mildly enlarged measuring 11.1 x 5.9 x 15.2 cm (estimated splenic volume of 498 mL). Adrenals/Urinary Tract: Bilateral kidneys and bilateral adrenal glands are normal in appearance. No hydroureteronephrosis. Urinary bladder is normal in appearance. Stomach/Bowel: Normal appearance of the stomach. No pathologic dilatation of small bowel or colon. Normal appendix. Vascular/Lymphatic: Minimal atherosclerotic disease is noted in the abdominal and pelvic vasculature,  without evidence of aneurysm or dissection. No lymphadenopathy noted in the abdomen or pelvis. Reproductive: Uterus and ovaries are unremarkable in appearance. Other: No significant volume of ascites.  No pneumoperitoneum. Musculoskeletal: There are no aggressive appearing lytic or blastic lesions noted in the visualized portions of the skeleton. IMPRESSION: 1. Mild splenomegaly, similar to the prior examination. The possibility of residual lymphoma in the spleen is not excluded, but the stability compared to the prior examination is reassuring. 2. No lymphadenopathy noted in the chest, abdomen or pelvis. 3. Interval development of some volume loss, airspace consolidation and architectural distortion in the inferior aspect of the right  upper lobe. If the patient has undergone prior thoracic radiation therapy, these findings could certainly be related to prior treatment. Alternatively, clinical correlation for signs and symptoms of pneumonia is recommended. 4. Additional incidental findings, as above. Electronically Signed   By: Vinnie Langton M.D.   On: 08/02/2015 16:04    Impression:  #1. Marginal zone lymphoma presenting with initial direct Coombs positive test, then a rising IgM serum paraprotein and progressive, mild, pancytopenia with no gross bone marrow infiltrates but a positive monoclonal B-cell population. Complete metabolic response to a combination of cladribine plus Rituxan and significant reduction in spleen size from 23 to15 cm. Spleen is no longer easily palpable on exam. Size has remained the same on follow-up CT scan done on 08/02/2015 Plan: Continued clinical follow-up. I will get a six-month interval scan in April 2017.  I will continue to follow IgM levels. IgM paraprotein has been useful to follow the activity of her disease.  #2. 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  #3. Elevated IgM paraprotein-paraneoplastic manifestation of #1.  #4. Iron deficiency anemia secondary to menorrhagia.  #5. History of ulcerative esophagitis  #6. Transient leukopenia: see discussion above, now resolved  #7. Acute bronchitis. Probably viral but it has persisted and sputum now purulent. In view of her overall immunocompromised status, will prescribe a 5 day course of azithromycin.  #8. Possible connection between her lymphoma occurring at a young age and exposure to toxic waste. This will be difficult to prove. She is already involved with a number of people in her community who have experienced either malignancy or other untoward symptoms.  Total time spent face-to-face with this patient for exam, counseling, and review of lab and x-ray data, 45 minutes.  CC: Patient Care Team: Sofie Hartigan, MD as PCP - General (Family Medicine)   Annia Belt, MD 10/24/201610:20 AM

## 2015-08-06 NOTE — Patient Instructions (Signed)
CT abdomen, pelvis 01/28/16 Lab same day: see orders MD visit 1 week after scans & lab Z pack: take as directed

## 2015-08-06 NOTE — Telephone Encounter (Signed)
-----   Message from Annia Belt, MD sent at 08/03/2015  7:49 AM EDT ----- Call remains pt spleen mildly enlarged but no change from previous scan and no adenopathy

## 2015-08-14 ENCOUNTER — Telehealth: Payer: Self-pay | Admitting: *Deleted

## 2015-08-14 ENCOUNTER — Other Ambulatory Visit: Payer: Self-pay | Admitting: Oncology

## 2015-08-14 DIAGNOSIS — C8307 Small cell B-cell lymphoma, spleen: Secondary | ICD-10-CM

## 2015-08-14 DIAGNOSIS — R05 Cough: Secondary | ICD-10-CM

## 2015-08-14 DIAGNOSIS — R059 Cough, unspecified: Secondary | ICD-10-CM

## 2015-08-14 MED ORDER — AZITHROMYCIN 250 MG PO TABS: ORAL_TABLET | ORAL | Status: AC

## 2015-08-14 NOTE — Telephone Encounter (Signed)
Returned pt's call and Dr Beryle Beams wants a CXR done. States she has a nagging ,persistent cough with drainage; it's in her throat and no pain in her chest. And let her know if CXR is still needed.

## 2015-08-14 NOTE — Telephone Encounter (Signed)
Pt informed Dr Beryle Beams would like CXR done and will send in rx for Z-pack. Informed appt is not needed; she can have it done anytime - stated she will have it done this week after she gets off of work.

## 2015-08-17 ENCOUNTER — Ambulatory Visit (HOSPITAL_COMMUNITY)
Admission: RE | Admit: 2015-08-17 | Discharge: 2015-08-17 | Disposition: A | Discharge status: Home / Self Care | Payer: Commercial Managed Care - PPO | Source: Ambulatory Visit | Attending: Oncology | Admitting: Oncology

## 2015-08-17 ENCOUNTER — Ambulatory Visit (HOSPITAL_COMMUNITY): Admission: RE | Admit: 2015-08-17 | Payer: Commercial Managed Care - PPO | Source: Ambulatory Visit

## 2015-08-17 DIAGNOSIS — Z8572 Personal history of non-Hodgkin lymphomas: Secondary | ICD-10-CM | POA: Insufficient documentation

## 2015-08-17 DIAGNOSIS — J984 Other disorders of lung: Secondary | ICD-10-CM | POA: Insufficient documentation

## 2015-08-17 DIAGNOSIS — R059 Cough, unspecified: Secondary | ICD-10-CM

## 2015-08-17 DIAGNOSIS — R05 Cough: Secondary | ICD-10-CM | POA: Diagnosis present

## 2015-08-17 DIAGNOSIS — C8307 Small cell B-cell lymphoma, spleen: Secondary | ICD-10-CM

## 2015-08-20 ENCOUNTER — Telehealth: Payer: Self-pay

## 2015-08-20 NOTE — Telephone Encounter (Signed)
-----   Message from Annia Belt, MD sent at 08/20/2015 12:31 PM EST ----- Juluis Rainier ----- Message -----    From: Rad Results In Interface    Sent: 08/18/2015   9:36 AM      To: Annia Belt, MD

## 2015-08-20 NOTE — Telephone Encounter (Signed)
This was sent to me by mistake. Please review this pt's chest xray.

## 2015-08-21 ENCOUNTER — Telehealth: Payer: Self-pay | Admitting: *Deleted

## 2015-08-21 NOTE — Telephone Encounter (Signed)
-----   Message from Annia Belt, MD sent at 08/20/2015 12:30 PM EST ----- Call pt: no pneumonia on CXR

## 2015-08-21 NOTE — Telephone Encounter (Signed)
Pt called / informed no pneumonia on CXR per Dr Beryle Beams.

## 2015-08-23 ENCOUNTER — Other Ambulatory Visit: Payer: Self-pay | Admitting: *Deleted

## 2015-08-23 ENCOUNTER — Other Ambulatory Visit: Payer: Self-pay | Admitting: Oncology

## 2015-08-23 MED ORDER — LEVOFLOXACIN 500 MG PO TABS: 500.0000 mg | ORAL_TABLET | Freq: Every day | ORAL | Status: AC

## 2016-01-28 ENCOUNTER — Other Ambulatory Visit (INDEPENDENT_AMBULATORY_CARE_PROVIDER_SITE_OTHER): Payer: Commercial Managed Care - PPO

## 2016-01-28 DIAGNOSIS — C8307 Small cell B-cell lymphoma, spleen: Secondary | ICD-10-CM

## 2016-01-28 DIAGNOSIS — D509 Iron deficiency anemia, unspecified: Secondary | ICD-10-CM | POA: Diagnosis not present

## 2016-01-28 DIAGNOSIS — R768 Other specified abnormal immunological findings in serum: Secondary | ICD-10-CM

## 2016-01-29 LAB — COMPREHENSIVE METABOLIC PANEL
ALBUMIN: 4 g/dL (ref 3.5–5.5)
ALT: 17 IU/L (ref 0–32)
AST: 12 IU/L (ref 0–40)
Albumin/Globulin Ratio: 2.1 (ref 1.2–2.2)
Alkaline Phosphatase: 64 IU/L (ref 39–117)
BUN / CREAT RATIO: 18 (ref 9–23)
BUN: 14 mg/dL (ref 6–24)
Bilirubin Total: 0.3 mg/dL (ref 0.0–1.2)
CALCIUM: 8.5 mg/dL — AB (ref 8.7–10.2)
CO2: 20 mmol/L (ref 18–29)
CREATININE: 0.78 mg/dL (ref 0.57–1.00)
Chloride: 103 mmol/L (ref 96–106)
GFR, EST AFRICAN AMERICAN: 107 mL/min/{1.73_m2} (ref >59)
GFR, EST NON AFRICAN AMERICAN: 93 mL/min/{1.73_m2} (ref >59)
GLOBULIN, TOTAL: 1.9 g/dL (ref 1.5–4.5)
GLUCOSE: 106 mg/dL — AB (ref 65–99)
Potassium: 4.2 mmol/L (ref 3.5–5.2)
Sodium: 141 mmol/L (ref 134–144)
TOTAL PROTEIN: 5.9 g/dL — AB (ref 6.0–8.5)

## 2016-01-29 LAB — CBC WITH DIFFERENTIAL/PLATELET
BASOS ABS: 0 10*3/uL (ref 0.0–0.2)
Basos: 0 %
EOS (ABSOLUTE): 0.2 10*3/uL (ref 0.0–0.4)
EOS: 5 %
HEMATOCRIT: 30.5 % — AB (ref 34.0–46.6)
HEMOGLOBIN: 9.6 g/dL — AB (ref 11.1–15.9)
IMMATURE GRANS (ABS): 0 10*3/uL (ref 0.0–0.1)
Immature Granulocytes: 0 %
LYMPHS: 18 %
Lymphocytes Absolute: 0.6 10*3/uL — ABNORMAL LOW (ref 0.7–3.1)
MCH: 25.1 pg — ABNORMAL LOW (ref 26.6–33.0)
MCHC: 31.5 g/dL (ref 31.5–35.7)
MCV: 80 fL (ref 79–97)
MONOCYTES: 8 %
Monocytes Absolute: 0.3 10*3/uL (ref 0.1–0.9)
NEUTROS ABS: 2.4 10*3/uL (ref 1.4–7.0)
Neutrophils: 69 %
Platelets: 164 10*3/uL (ref 150–379)
RBC: 3.83 x10E6/uL (ref 3.77–5.28)
RDW: 15.8 % — ABNORMAL HIGH (ref 12.3–15.4)
WBC: 3.5 10*3/uL (ref 3.4–10.8)

## 2016-01-29 LAB — RETICULOCYTES: RETIC CT PCT: 2.7 % — AB (ref 0.6–2.6)

## 2016-01-29 LAB — IGG, IGA, IGM
IGA/IMMUNOGLOBULIN A, SERUM: 181 mg/dL (ref 87–352)
IGG (IMMUNOGLOBIN G), SERUM: 685 mg/dL — AB (ref 700–1600)
IgM (Immunoglobulin M), Srm: 50 mg/dL (ref 26–217)

## 2016-01-29 LAB — FERRITIN: Ferritin: 5 ng/mL — ABNORMAL LOW (ref 15–150)

## 2016-01-29 LAB — IRON AND TIBC
IRON SATURATION: 7 % — AB (ref 15–55)
IRON: 23 ug/dL — AB (ref 27–159)
Total Iron Binding Capacity: 328 ug/dL (ref 250–450)
UIBC: 305 ug/dL (ref 131–425)

## 2016-01-29 LAB — DIRECT ANTIGLOBULIN TEST (NOT AT ARMC): Coombs', Direct: NEGATIVE

## 2016-02-04 ENCOUNTER — Encounter: Payer: Self-pay | Admitting: Oncology

## 2016-02-04 ENCOUNTER — Ambulatory Visit (INDEPENDENT_AMBULATORY_CARE_PROVIDER_SITE_OTHER): Payer: Commercial Managed Care - PPO | Admitting: Oncology

## 2016-02-04 VITALS — BP 124/54 | HR 78 | Temp 98.3°F | Ht 67.0 in | Wt 205.0 lb

## 2016-02-04 DIAGNOSIS — C8307 Small cell B-cell lymphoma, spleen: Secondary | ICD-10-CM | POA: Diagnosis not present

## 2016-02-04 DIAGNOSIS — D72819 Decreased white blood cell count, unspecified: Secondary | ICD-10-CM

## 2016-02-04 DIAGNOSIS — D509 Iron deficiency anemia, unspecified: Secondary | ICD-10-CM

## 2016-02-04 DIAGNOSIS — R768 Other specified abnormal immunological findings in serum: Secondary | ICD-10-CM | POA: Diagnosis not present

## 2016-02-04 DIAGNOSIS — D61810 Antineoplastic chemotherapy induced pancytopenia: Secondary | ICD-10-CM

## 2016-02-04 DIAGNOSIS — D472 Monoclonal gammopathy: Secondary | ICD-10-CM

## 2016-02-04 NOTE — Patient Instructions (Signed)
Return for lab in July then again in October CT scan abdomen with contrast in October MD visit 1-2 weeks after lab & CT in October

## 2016-02-04 NOTE — Progress Notes (Signed)
Patient ID: Kara Perkins, female   DOB: 11/22/1970, 45 y.o.   MRN: [id number] Hematology and Oncology Follow Up Visit  Kara Perkins [id number] 1971-02-02 45 y.o. 02/04/2016 7:19 PM   Principle Diagnosis: Encounter Diagnoses  Name Primary?  . IgM monoclonal gammopathy of uncertain significance Yes  . Positive Coombs test   . Iron deficiency anemia   . Lymphoma, marginal zone, spleen [facility])   Clinical Summary: 45 year-old woman I first saw when she was pregnant back in 2008 and was found to have a positive direct Coombs' test. She never demonstrated any signs of hemolysis. She was followed closely through the pregnancy and blood counts remained stable. Over the years, the positive Coombs test extinguished. As part of her initial evaluation, serum immunoglobulins were done. She had a mild elevation of IgM. No concomitant suppression of her other immunoglobulins. I had forgotten about this until she had some GI problems back in March of 2011 when she developed acute ulcerative esophagitis. As part of her GI evaluation immunoglobulin studies were done and she showed a persistent elevation of IgM. She was reevaluated. Initial value for IgM was 425 mg percent compared with 530 mg percent done by her gastroenterologist. IgM levels monitored more closely since that time. There was a slow but steady trend for the IgM levels to be going up. A single 24-hour total urine protein was elevated at 667 mg in a pattern of nonselective proteinuria on 12/30/2009 but this was not reproducible. Subsequent study done 07/17/2010 with total urine protein on 89 mg, on 02/06/2011 18 mg. There was a monoclonal IgM kappa paraprotein in the urine as well as serum. In addition to the slow rise in her IgM, there was also a trend for progressive anemia, intermittent leukopenia, and intermittent fall in her platelet count but no single platelet count has been less than 120,000. Interpreting her hemoglobin has been  complicated by the fact that she is chronically iron deficient from menorrhagia.  CT scan of the abdomen and pelvis done 01/02/2010 did not show any lymphadenopathy or splenomegaly at that time. Bone marrow aspiration and biopsy was done on 12/11/2011. There was no evidence for either Waldenstrm's or multiple myeloma on that specimen.  She remained asymptomatic. However, lab done in May, 2015 showed progressive leukopenia with total white count 3,100, 44% neutrophils, 8% lymphocytes, I percent monocytes, 3 eosinophils. Platelet count down from 141,000 and October 2014 to 120,000 . Hemoglobin has come up from 8.7 g to 9.8 g on oral iron replacement.  I detected splenomegaly 5 cm below left costal margin on my 02/21/2014 exam. This was confirmed on a CT scan which showed spleen measuring 23 cm. Repeat bone marrow aspiration and biopsy done 03/23/2014 showed a monoclonal population of B lymphocytes expressing CD20 negative for CD5 and CD20 5. No gross lymphoid aggregates or infiltrates. 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 I reviewed treatment options at length with the patient and her husband. I recommended splenectomy as my first choice as opposed to chemotherapy. However the patient elected to take a chemotherapy program. Treatment administered by Dr. Heath Lark. She completed a course of treatment with cladribine 0.1 mg per kilogram IV plus Rituxan 375 mg per meter squared both given weekly x6 weeks between July 16 and 06/01/2014 She had no clinical lymphadenopathy but a staging PET scan done on 04/19/2014 other than showing an enlarged, hypermetabolic, spleen 23 cm in maximum dimension, also showed activity in bilateral axillary lymph glands and in addition,  normal sized but hypermetabolic lymph glands along the chest wall, in the upper abdomen, and pelvis. She tolerated treatments well. She developed transient mild leukopenia with lowest white count 2200, and mild  thrombocytopenia with lowest platelet count 110,000. White count recovered to 4000 with 80% neutrophils, and platelets 162,000 as of 07/31/2014. She did not get any infections. She did not require hospitalization. IgM paraprotein normalized from peak pretreatment value of 1130 mg percent to most recent value of 192 on 07/31/14 . PET scan showed complete metabolic response in all lymph node areas and the spleen, but persistent mild splenomegaly but reduction down from 23 cm to 15 cm.   Interim History:  She had a rough winter with a persistent bronchitis lasting for almost 2 months. She had to take a number of courses of antibiotics. She is back to her baseline at present. She is no longer having any abdominal discomfort since her spleen regressed. Her IgM levels have been consistently low since treatment with most recent value of 50 mg percent normal range 26-217, on 01/28/2016. IgG level slightly depressed at 685 mg percent but stable compare with prior values. She has a persistent mild leukopenia total white count in the range of 3500-4300 with current value 3500, 69% neutrophils, 18% lymphocytes on April 17. She has a persistent microcytic anemia hemoglobin running 9-10 grams. She continues on oral iron replacement and has been able to get her ferritin into the low normal range as of 07/30/2015. However value done in anticipation of today's visit on 01/28/2016 shows ferritin has fallen down to 5.  Medications: reviewed  Allergies: No Known Allergies  Review of Systems: See interim history Remaining ROS negative:   Physical Exam: Blood pressure 124/54, pulse 78, temperature 98.3 F (36.8 C), temperature source Oral, height '5\' 7"'  (1.702 m), weight 205 lb (92.987 kg), SpO2 99 %. Wt Readings from Last 3 Encounters:  02/04/16 205 lb (92.987 kg)  08/06/15 204 lb 1.6 oz (92.579 kg)  02/12/15 205 lb 11.2 oz (93.305 kg)     General appearance: Well-nourished Caucasian woman HENNT: Pharynx no  erythema, exudate, mass, or ulcer. No thyromegaly or thyroid nodules Lymph nodes: No cervical, supraclavicular, or axillary lymphadenopathy Breasts:  Lungs: Clear to auscultation, resonant to percussion throughout Heart: Regular rhythm, no murmur, no gallop, no rub, no click, no edema Abdomen: Soft, nontender, normal bowel sounds, no mass, no organomegaly including examination in the right lateral decubitus position Extremities: No edema, no calf tenderness Musculoskeletal: no joint deformities GU:  Vascular: Carotid pulses 2+, no bruits,  Neurologic: Alert, oriented, PERRLA,  cranial nerves grossly normal, motor strength 5 over 5, reflexes 1+ symmetric, upper body coordination normal, gait normal, Skin: No rash or ecchymosis  Lab Results: CBC W/Diff    Component Value Date/Time   WBC 3.5 01/28/2016 0834   WBC 4.0 05/07/2015 1330   WBC 3.0* 01/30/2015 0923   RBC 3.83 01/28/2016 0834   RBC 3.60* 05/07/2015 1330   RBC 3.60* 02/12/2015 0922   RBC 4.27 01/30/2015 0923   HGB 9.1* 05/07/2015 1330   HGB 11.3* 01/30/2015 0923   HCT 30.5* 01/28/2016 0834   HCT 28.5* 05/07/2015 1330   HCT 34.9 01/30/2015 0923   PLT 164 01/28/2016 0834   PLT 150 05/07/2015 1330   PLT 136* 01/30/2015 0923   MCV 80 01/28/2016 0834   MCV 79.2 05/07/2015 1330   MCV 81.7 01/30/2015 0923   MCH 25.1* 01/28/2016 0834   MCH 25.3* 05/07/2015 1330   MCH 26.5  01/30/2015 0923   MCHC 31.5 01/28/2016 0834   MCHC 31.9 05/07/2015 1330   MCHC 32.4 01/30/2015 0923   RDW 15.8* 01/28/2016 0834   RDW 16.3* 05/07/2015 1330   RDW 15.7* 01/30/2015 0923   LYMPHSABS 0.6* 01/28/2016 0834   LYMPHSABS 0.6* 05/07/2015 1330   LYMPHSABS 0.3* 01/30/2015 0923   MONOABS 0.4 05/07/2015 1330   MONOABS 0.2 01/30/2015 0923   EOSABS 0.2 01/28/2016 0834   EOSABS 0.1 05/07/2015 1330   EOSABS 0.1 01/30/2015 0923   BASOSABS 0.0 01/28/2016 0834   BASOSABS 0.0 05/07/2015 1330   BASOSABS 0.0 01/30/2015 0923     Chemistry       Component Value Date/Time   NA 141 01/28/2016 0834   NA 139 01/30/2015 0924   NA 137 04/11/2014 1059   K 4.2 01/28/2016 0834   K 4.8 01/30/2015 0924   CL 103 01/28/2016 0834   CO2 20 01/28/2016 0834   CO2 25 01/30/2015 0924   BUN 14 01/28/2016 0834   BUN 11.5 01/30/2015 0924   BUN 14 04/11/2014 1059   CREATININE 0.78 01/28/2016 0834   CREATININE 0.8 01/30/2015 0924   CREATININE 0.85 04/11/2014 1059   CREATININE 0.91 02/06/2011 0856      Component Value Date/Time   CALCIUM 8.5* 01/28/2016 0834   CALCIUM 9.1 01/30/2015 0924   ALKPHOS 64 01/28/2016 0834   ALKPHOS 77 01/30/2015 0924   AST 12 01/28/2016 0834   AST 21 01/30/2015 0924   ALT 17 01/28/2016 0834   ALT 27 01/30/2015 0924   BILITOT 0.3 01/28/2016 0834   BILITOT 0.48 01/30/2015 0924   BILITOT 0.7 04/11/2014 1059    Ferritin 5 on 01/28/2016 Direct Coombs test negative, bilirubin 0.3 IgM 50 mg percent   Radiological Studies: No results found.  Impression:  #1. Marginal zone lymphoma presenting with initial direct Coombs positive test, then a rising IgM serum paraprotein and progressive, mild, pancytopenia with no gross bone marrow infiltrates but a positive monoclonal B-cell population. Complete metabolic response to a combination of cladribine plus Rituxan and significant reduction in spleen size from 23 to15 cm. Spleen is no longer  palpable on exam. Size has remained the same on follow-up CT scan done on 08/02/2015 Plan: Continued clinical follow-up. Since IgM level remains low and spleen is not palpable today, I will wait for an additional 6 months to get a follow-up CT scan. I will continue to follow IgM levels. IgM paraprotein has been useful to follow the activity of her disease.  #2. 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 Currently negative and no evidence for hemolysis.  #3. Elevated IgM paraprotein-paraneoplastic manifestation of #1.  #4. Iron deficiency anemia secondary  to menorrhagia. At this point it appears that she has an element of iron malabsorption. We will get her back for a dose of parenteral iron.  #5. History of ulcerative esophagitis  #6. Persistent mild leukopenia: Likely footprint of chemotherapy.    CC: Patient Care Team: Sofie Hartigan, MD as PCP - General (Family Medicine)   Annia Belt, MD 4/24/20177:19 PM

## 2016-04-22 ENCOUNTER — Other Ambulatory Visit (INDEPENDENT_AMBULATORY_CARE_PROVIDER_SITE_OTHER): Payer: Commercial Managed Care - PPO

## 2016-04-22 DIAGNOSIS — D472 Monoclonal gammopathy: Secondary | ICD-10-CM | POA: Diagnosis not present

## 2016-04-22 DIAGNOSIS — D509 Iron deficiency anemia, unspecified: Secondary | ICD-10-CM | POA: Diagnosis not present

## 2016-04-22 DIAGNOSIS — C8307 Small cell B-cell lymphoma, spleen: Secondary | ICD-10-CM

## 2016-04-22 DIAGNOSIS — R768 Other specified abnormal immunological findings in serum: Secondary | ICD-10-CM

## 2016-04-23 ENCOUNTER — Ambulatory Visit (HOSPITAL_COMMUNITY)
Admission: RE | Admit: 2016-04-23 | Discharge: 2016-04-23 | Disposition: A | Discharge status: Home / Self Care | Payer: Commercial Managed Care - PPO | Source: Ambulatory Visit | Attending: Oncology | Admitting: Oncology

## 2016-04-23 DIAGNOSIS — D472 Monoclonal gammopathy: Secondary | ICD-10-CM | POA: Diagnosis present

## 2016-04-23 DIAGNOSIS — C8307 Small cell B-cell lymphoma, spleen: Secondary | ICD-10-CM | POA: Insufficient documentation

## 2016-04-23 DIAGNOSIS — R161 Splenomegaly, not elsewhere classified: Secondary | ICD-10-CM | POA: Insufficient documentation

## 2016-04-23 DIAGNOSIS — D509 Iron deficiency anemia, unspecified: Secondary | ICD-10-CM

## 2016-04-23 DIAGNOSIS — R768 Other specified abnormal immunological findings in serum: Secondary | ICD-10-CM

## 2016-04-23 LAB — COMPREHENSIVE METABOLIC PANEL
A/G RATIO: 1.9 (ref 1.2–2.2)
ALK PHOS: 81 IU/L (ref 39–117)
ALT: 22 IU/L (ref 0–32)
AST: 20 IU/L (ref 0–40)
Albumin: 4.1 g/dL (ref 3.5–5.5)
BILIRUBIN TOTAL: 0.3 mg/dL (ref 0.0–1.2)
BUN/Creatinine Ratio: 14 (ref 9–23)
BUN: 14 mg/dL (ref 6–24)
CHLORIDE: 99 mmol/L (ref 96–106)
CO2: 22 mmol/L (ref 18–29)
Calcium: 8.7 mg/dL (ref 8.7–10.2)
Creatinine, Ser: 0.97 mg/dL (ref 0.57–1.00)
GFR calc non Af Amer: 71 mL/min/{1.73_m2} (ref >59)
GFR, EST AFRICAN AMERICAN: 82 mL/min/{1.73_m2} (ref >59)
GLUCOSE: 118 mg/dL — AB (ref 65–99)
Globulin, Total: 2.2 g/dL (ref 1.5–4.5)
POTASSIUM: 4 mmol/L (ref 3.5–5.2)
Sodium: 140 mmol/L (ref 134–144)
TOTAL PROTEIN: 6.3 g/dL (ref 6.0–8.5)

## 2016-04-23 LAB — CBC WITH DIFFERENTIAL/PLATELET
BASOS ABS: 0 10*3/uL (ref 0.0–0.2)
BASOS: 0 %
EOS (ABSOLUTE): 0.1 10*3/uL (ref 0.0–0.4)
Eos: 2 %
Hematocrit: 30.3 % — ABNORMAL LOW (ref 34.0–46.6)
Hemoglobin: 9.3 g/dL — ABNORMAL LOW (ref 11.1–15.9)
Immature Grans (Abs): 0 10*3/uL (ref 0.0–0.1)
Immature Granulocytes: 0 %
LYMPHS ABS: 0.5 10*3/uL — AB (ref 0.7–3.1)
LYMPHS: 20 %
MCH: 23.8 pg — AB (ref 26.6–33.0)
MCHC: 30.7 g/dL — AB (ref 31.5–35.7)
MCV: 78 fL — AB (ref 79–97)
MONOS ABS: 0.1 10*3/uL (ref 0.1–0.9)
Monocytes: 5 %
NEUTROS ABS: 1.8 10*3/uL (ref 1.4–7.0)
Neutrophils: 73 %
PLATELETS: 192 10*3/uL (ref 150–379)
RBC: 3.91 x10E6/uL (ref 3.77–5.28)
RDW: 17.7 % — AB (ref 12.3–15.4)
WBC: 2.5 10*3/uL — CL (ref 3.4–10.8)

## 2016-04-23 LAB — LACTATE DEHYDROGENASE: LDH: 135 IU/L (ref 119–226)

## 2016-04-23 LAB — IGG, IGA, IGM
IgA/Immunoglobulin A, Serum: 171 mg/dL (ref 87–352)
IgG (Immunoglobin G), Serum: 787 mg/dL (ref 700–1600)
IgM (Immunoglobulin M), Srm: 52 mg/dL (ref 26–217)

## 2016-04-23 MED ORDER — IOPAMIDOL (ISOVUE-300) INJECTION 61%
INTRAVENOUS | Status: AC
  Administered 2016-04-23: 100 mL
  Filled 2016-04-23: qty 100

## 2016-04-28 ENCOUNTER — Telehealth: Payer: Self-pay | Admitting: *Deleted

## 2016-04-28 NOTE — Telephone Encounter (Signed)
-----   Message from Annia Belt, MD sent at 04/28/2016  8:34 AM EDT ----- Call pt: no change - stable mild splenomegaly on CT. White count a little lower than last time. IgM antibody remains in low normal range. Doris should call her for a follow up appointment. I don't see that one is scheduled. DrG

## 2016-04-28 NOTE — Telephone Encounter (Signed)
Pt called /informed  "no change - stable mild splenomegaly on CT. White count a little lower than last time. IgM antibody remains in low normal range." per Dr Beryle Beams. Per pt's request - informed WBC 2.5; Hbg 9.3; requesting spleen measurements- also from previous CT's 08/02/15 and 01/30/15. Wants to know if she should be concern about low WBC?

## 2016-04-30 ENCOUNTER — Other Ambulatory Visit: Payer: Self-pay | Admitting: Oncology

## 2016-04-30 MED ORDER — CIPROFLOXACIN HCL 500 MG PO TABS
500.0000 mg | ORAL_TABLET | Freq: Two times a day (BID) | ORAL | Status: AC
Start: 2016-04-30 — End: 2016-05-06

## 2016-04-30 NOTE — Telephone Encounter (Signed)
Call from pt - states you probably called her back the other day but she was outside. Wants to know about low white count.  Gave me her email address  [email or web address]  If u want to send her a response instead of calling.

## 2016-07-17 ENCOUNTER — Other Ambulatory Visit: Payer: Self-pay | Admitting: Oncology

## 2016-07-17 DIAGNOSIS — R768 Other specified abnormal immunological findings in serum: Secondary | ICD-10-CM

## 2016-07-17 DIAGNOSIS — D472 Monoclonal gammopathy: Secondary | ICD-10-CM

## 2016-07-17 DIAGNOSIS — C8307 Small cell B-cell lymphoma, spleen: Secondary | ICD-10-CM

## 2016-07-25 ENCOUNTER — Telehealth: Payer: Self-pay | Admitting: Family Medicine

## 2016-07-25 NOTE — Telephone Encounter (Signed)
APT. REMINDER CALL, NO ANSWER, MAILBOX FULL °

## 2016-07-28 ENCOUNTER — Other Ambulatory Visit (INDEPENDENT_AMBULATORY_CARE_PROVIDER_SITE_OTHER): Payer: Commercial Managed Care - PPO

## 2016-07-28 DIAGNOSIS — R768 Other specified abnormal immunological findings in serum: Secondary | ICD-10-CM

## 2016-07-28 DIAGNOSIS — C8307 Small cell B-cell lymphoma, spleen: Secondary | ICD-10-CM

## 2016-07-28 DIAGNOSIS — D472 Monoclonal gammopathy: Secondary | ICD-10-CM | POA: Diagnosis not present

## 2016-08-05 ENCOUNTER — Encounter: Payer: Self-pay | Admitting: Oncology

## 2016-08-05 ENCOUNTER — Ambulatory Visit (INDEPENDENT_AMBULATORY_CARE_PROVIDER_SITE_OTHER): Payer: Commercial Managed Care - PPO | Admitting: Oncology

## 2016-08-05 VITALS — BP 128/61 | HR 72 | Temp 98.1°F | Ht 67.0 in | Wt 201.4 lb

## 2016-08-05 DIAGNOSIS — C8307 Small cell B-cell lymphoma, spleen: Secondary | ICD-10-CM | POA: Diagnosis not present

## 2016-08-05 NOTE — Patient Instructions (Signed)
Schedule ultrasound of abdomen within next week at [facility] 10/20/16 MD visit 1-2 weeks after lab in January, 2018

## 2016-08-05 NOTE — Progress Notes (Signed)
Hematology and Oncology Follow Up Visit  Kara Perkins [id number] 06/12/1971 45 y.o. 08/05/2016 9:19 AM   Principle Diagnosis: Encounter Diagnosis  Name Primary?  . Lymphoma, marginal zone, spleen ([place]) Yes  Clinical Summary: 45 year old woman I first saw when she was pregnant back in 2008 and was found to have a positive direct Coombs' test. She never demonstrated any signs of hemolysis. She was followed closely through the pregnancy and blood counts remained stable. Over the years, the positive Coombs test extinguished. As part of her initial evaluation, serum immunoglobulins were done. She had a mild elevation of IgM. No concomitant suppression of her other immunoglobulins. I had forgotten about this until she had some GI problems back in March of 2011 when she developed acute ulcerative esophagitis. As part of her GI evaluation immunoglobulin studies were done and she showed a persistent elevation of IgM. She was reevaluated. Initial value for IgM was 425 mg percent compared with 530 mg percent done by her gastroenterologist. IgM levels monitored more closely since that time. There was a slow but steady trend for the IgM levels to be going up. A single 24-hour total urine protein was elevated at 667 mg in a pattern of nonselective proteinuria on 12/30/2009 but this was not reproducible. Subsequent study done 07/17/2010 with total urine protein on 89 mg, on 02/06/2011 18 mg. There was a monoclonal IgM kappa paraprotein in the urine as well as serum. In addition to the slow rise in her IgM, there was also a trend for progressive anemia, intermittent leukopenia, and intermittent fall in her platelet count but no single platelet count has been less than 120,000. Interpreting her hemoglobin has been complicated by the fact that she is chronically iron deficient from menorrhagia.  CT scan of the abdomen and pelvis done 01/02/2010 did not show any lymphadenopathy or splenomegaly at that time. Bone  marrow aspiration and biopsy was done on 12/11/2011. There was no evidence for either Waldenstrm's or multiple myeloma on that specimen.  She remained asymptomatic. However, lab done in May, 2015 showed progressive leukopenia with total white count 3,100, 44% neutrophils, 8% lymphocytes, I percent monocytes, 3 eosinophils. Platelet count down from 141,000 and October 2014 to 120,000 . Hemoglobin has come up from 8.7 g to 9.8 g on oral iron replacement.  I detected splenomegaly 5 cm below left costal margin on my 02/21/2014 exam. This was confirmed on a CT scan which showed spleen measuring 23 cm. Repeat bone marrow aspiration and biopsy done 03/23/2014 showed a monoclonal population of B lymphocytes expressing CD20 negative for CD5 and CD20 5. No gross lymphoid aggregates or infiltrates. 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 I reviewed treatment options at length with the patient and her husband. I recommended splenectomy as my first choice as opposed to chemotherapy. However the patient elected to take a chemotherapy program. Treatment administered by Dr. Heath Lark. She completed a course of treatment with cladribine 0.1 mg per kilogram IV plus Rituxan 375 mg per meter squared both given weekly x6 weeks between July 16 and 06/01/2014 She had no clinical lymphadenopathy but a staging PET scan done on 04/19/2014 other than showing an enlarged, hypermetabolic, spleen 23 cm in maximum dimension, also showed activity in bilateral axillary lymph glands and in addition, normal sized but hypermetabolic lymph glands along the chest wall, in the upper abdomen, and pelvis. She tolerated treatments well. She developed transient mild leukopenia with lowest white count 2200, and mild thrombocytopenia with lowest platelet  count 110,000. White count recovered to 4000 with 80% neutrophils, and platelets 162,000 as of 07/31/2014. She did not get any infections. She did not require  hospitalization. IgM paraprotein normalized from peak pretreatment value of 1130 mg percent to most recent value of 192 on 07/31/14 . PET scan showed complete metabolic response in all lymph node areas and the spleen, but persistent mild splenomegaly but reduction down from 23 cm to 15 cm. Spleen size has remained stable at 15 cm through most recent CT scan done on 04/23/2016.  Interim History:   Overall doing well. No interim infections. She got her flu shot already. She has had some intermittent left upper quadrant discomfort and one episode of significant pain which resolved on its own. No fevers. No night sweats. No lymphadenopathy. No change in her bowel habit. No recurrent reflux symptoms. She is running around a lot with her kids. One of her daughters is doing very well with softball. 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  Medications: reviewed  Allergies: No Known Allergies  Review of Systems: See interim history Remaining ROS negative:   Physical Exam: Blood pressure 128/61, pulse 72, temperature 98.1 F (36.7 C), temperature source Oral, height '5\' 7"'  (1.702 m), weight 201 lb 6.4 oz (91.4 kg), SpO2 99 %. Wt Readings from Last 3 Encounters:  08/05/16 201 lb 6.4 oz (91.4 kg)  02/04/16 205 lb (93 kg)  08/06/15 204 lb 1.6 oz (92.6 kg)     General appearance: well nourished Caucasian woman HENNT: Pharynx no erythema, exudate, mass, or ulcer. No thyromegaly or thyroid nodules Lymph nodes: No cervical, supraclavicular, or axillary lymphadenopathy Breasts:  Lungs: Clear to auscultation, resonant to percussion throughout Heart: Regular rhythm, no murmur, no gallop, no rub, no click, no edema Abdomen: Soft, nontender, normal bowel sounds, no mass, no organomegaly Extremities: No edema, no calf tenderness Musculoskeletal: no joint deformities GU:  Vascular: Carotid pulses 2+, no bruits,  Neurologic: Alert, oriented, PERRLA,  cranial nerves  grossly normal, motor strength 5 over 5, reflexes 1+ symmetric, upper body coordination normal, gait normal, Skin: No rash or ecchymosis  Lab Results: CBC W/Diff    Component Value Date/Time   WBC 3.4 07/28/2016 0834   WBC 4.0 05/07/2015 1330   RBC 4.08 07/28/2016 0834   RBC 3.60 (L) 05/07/2015 1330   HGB 9.1 (L) 05/07/2015 1330   HGB 11.3 (L) 01/30/2015 0923   HCT 32.1 (L) 07/28/2016 0834   HCT 34.9 01/30/2015 0923   PLT 161 07/28/2016 0834   MCV 79 07/28/2016 0834   MCV 81.7 01/30/2015 0923   MCH 24.3 (L) 07/28/2016 0834   MCH 25.3 (L) 05/07/2015 1330   MCHC 30.8 (L) 07/28/2016 0834   MCHC 31.9 05/07/2015 1330   RDW 16.5 (H) 07/28/2016 0834   RDW 15.7 (H) 01/30/2015 0923   LYMPHSABS 0.7 07/28/2016 0834   LYMPHSABS 0.3 (L) 01/30/2015 0923   MONOABS 0.4 05/07/2015 1330   MONOABS 0.2 01/30/2015 0923   EOSABS 0.1 07/28/2016 0834   BASOSABS 0.0 07/28/2016 0834   BASOSABS 0.0 01/30/2015 0923     Chemistry      Component Value Date/Time   NA 140 07/28/2016 0834   NA 139 01/30/2015 0924   K 4.3 07/28/2016 0834   K 4.8 01/30/2015 0924   CL 102 07/28/2016 0834   CO2 23 07/28/2016 0834   CO2 25 01/30/2015 0924   BUN 11 07/28/2016 0834   BUN 11.5 01/30/2015 0924  CREATININE 0.75 07/28/2016 0834   CREATININE 0.8 01/30/2015 0924      Component Value Date/Time   CALCIUM 8.8 07/28/2016 0834   CALCIUM 9.1 01/30/2015 0924   ALKPHOS 79 07/28/2016 0834   ALKPHOS 77 01/30/2015 0924   AST 16 07/28/2016 0834   AST 21 01/30/2015 0924   ALT 24 07/28/2016 0834   ALT 27 01/30/2015 0924   BILITOT 0.2 07/28/2016 0834   BILITOT 0.48 01/30/2015 0924       Radiological Studies: No results found.  Impression:  #1. Marginal zone lymphoma presenting with initial direct Coombs positive test, then a rising IgM serum paraprotein and progressive, mild, pancytopenia with no gross bone marrow infiltrates but a positive monoclonal B-cell population. Complete metabolic response to a  combination of cladribine plus Rituxan and significant reduction in spleen size from 23 to15 cm. fall in IgM from peak value of 1100 mg percent to 50 mg percent. Spleen is no longer  palpable on exam. Size has remained the same on follow-up CT scan done on 04/23/2016. She is not having intermittent left upper quadrant discomfort. Spleen still not palpable on my exam today. IgM paraprotein is rising but still in mid range normal. Plan: I'm going to go ahead and get an ultrasound of her spleen at this time to try to avoid repetitive radiation with CT scans. However, if spleen appears to be enlarging again, she will need to be restaged. We talked about possible treatments. Splenectomy is still an option. However, since her diagnosis, we have a number of other reasonable treatment options. I would use a Rituxan-based regimen either Rituxan plus Revlimid or Rituxan plus Ibrutinib or idelalisib.  #2. 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 Currently negative and no evidence for hemolysis.  #3. Elevated IgM paraprotein-paraneoplastic manifestation of #1.  #4. Iron deficiency anemia secondary to menorrhagia. At this point it appears that she has an element of iron malabsorption.   #5. History of ulcerative esophagitis. Currently asymptomatic.  #6. Persistent mild leukopenia: Likely footprint of chemotherapy.  CC: Patient Care Team: Sofie Hartigan, MD as PCP - General (Family Medicine)   Annia Belt, MD 10/24/20179:19 AM

## 2016-08-06 LAB — CBC WITH DIFFERENTIAL/PLATELET
BASOS: 0 %
Basophils Absolute: 0 10*3/uL (ref 0.0–0.2)
EOS (ABSOLUTE): 0.1 10*3/uL (ref 0.0–0.4)
EOS: 2 %
HEMATOCRIT: 32.1 % — AB (ref 34.0–46.6)
Hemoglobin: 9.9 g/dL — ABNORMAL LOW (ref 11.1–15.9)
Immature Grans (Abs): 0 10*3/uL (ref 0.0–0.1)
Immature Granulocytes: 0 %
LYMPHS ABS: 0.7 10*3/uL (ref 0.7–3.1)
Lymphs: 21 %
MCH: 24.3 pg — ABNORMAL LOW (ref 26.6–33.0)
MCHC: 30.8 g/dL — AB (ref 31.5–35.7)
MCV: 79 fL (ref 79–97)
MONOS ABS: 0.2 10*3/uL (ref 0.1–0.9)
Monocytes: 5 %
Neutrophils Absolute: 2.4 10*3/uL (ref 1.4–7.0)
Neutrophils: 72 %
Platelets: 161 10*3/uL (ref 150–379)
RBC: 4.08 x10E6/uL (ref 3.77–5.28)
RDW: 16.5 % — AB (ref 12.3–15.4)
WBC: 3.4 10*3/uL (ref 3.4–10.8)

## 2016-08-06 LAB — IMMUNOFIXATION ELECTROPHORESIS
IGG (IMMUNOGLOBIN G), SERUM: 786 mg/dL (ref 700–1600)
IgA/Immunoglobulin A, Serum: 178 mg/dL (ref 87–352)
IgM (Immunoglobulin M), Srm: 124 mg/dL (ref 26–217)
TOTAL PROTEIN: 6.5 g/dL (ref 6.0–8.5)

## 2016-08-06 LAB — COMPREHENSIVE METABOLIC PANEL
A/G RATIO: 1.8 (ref 1.2–2.2)
ALK PHOS: 79 IU/L (ref 39–117)
ALT: 24 IU/L (ref 0–32)
AST: 16 IU/L (ref 0–40)
Albumin: 4.2 g/dL (ref 3.5–5.5)
BILIRUBIN TOTAL: 0.2 mg/dL (ref 0.0–1.2)
BUN/Creatinine Ratio: 15 (ref 9–23)
BUN: 11 mg/dL (ref 6–24)
CHLORIDE: 102 mmol/L (ref 96–106)
CO2: 23 mmol/L (ref 18–29)
Calcium: 8.8 mg/dL (ref 8.7–10.2)
Creatinine, Ser: 0.75 mg/dL (ref 0.57–1.00)
GFR calc Af Amer: 111 mL/min/{1.73_m2} (ref >59)
GFR calc non Af Amer: 97 mL/min/{1.73_m2} (ref >59)
Globulin, Total: 2.3 g/dL (ref 1.5–4.5)
Glucose: 93 mg/dL (ref 65–99)
POTASSIUM: 4.3 mmol/L (ref 3.5–5.2)
Sodium: 140 mmol/L (ref 134–144)

## 2016-08-06 LAB — RETICULOCYTES: RETIC CT PCT: 3 % — AB (ref 0.6–2.6)

## 2016-08-06 LAB — LACTATE DEHYDROGENASE: LDH: 151 IU/L (ref 119–226)

## 2016-08-11 ENCOUNTER — Ambulatory Visit (HOSPITAL_COMMUNITY)
Admission: RE | Admit: 2016-08-11 | Discharge: 2016-08-11 | Disposition: A | Discharge status: Home / Self Care | Payer: Commercial Managed Care - PPO | Source: Ambulatory Visit | Attending: Oncology | Admitting: Oncology

## 2016-08-11 DIAGNOSIS — C8307 Small cell B-cell lymphoma, spleen: Secondary | ICD-10-CM

## 2016-08-11 DIAGNOSIS — K760 Fatty (change of) liver, not elsewhere classified: Secondary | ICD-10-CM | POA: Diagnosis not present

## 2016-08-11 DIAGNOSIS — R161 Splenomegaly, not elsewhere classified: Secondary | ICD-10-CM | POA: Insufficient documentation

## 2016-08-12 ENCOUNTER — Telehealth: Payer: Self-pay | Admitting: *Deleted

## 2016-08-12 NOTE — Telephone Encounter (Signed)
-----   Message from Annia Belt, MD sent at 08/11/2016  8:46 AM EDT ----- Call pt: spleen unchanged from previous. We can continue observation alone

## 2016-08-12 NOTE — Telephone Encounter (Signed)
Pt called / informed "spleen unchanged from previous. We can continue observation alone" per Dr Beryle Beams. Pt asked for spleen measurement compared with yesterday's result- 14.3 cm prev 15.3 cm on CT.

## 2016-08-13 ENCOUNTER — Other Ambulatory Visit: Payer: Self-pay | Admitting: Oncology

## 2016-10-20 ENCOUNTER — Other Ambulatory Visit (INDEPENDENT_AMBULATORY_CARE_PROVIDER_SITE_OTHER): Payer: Commercial Managed Care - PPO

## 2016-10-20 DIAGNOSIS — C8307 Small cell B-cell lymphoma, spleen: Secondary | ICD-10-CM

## 2016-10-21 LAB — CBC WITH DIFFERENTIAL/PLATELET
Basophils Absolute: 0 10*3/uL (ref 0.0–0.2)
Basos: 0 %
EOS (ABSOLUTE): 0.1 10*3/uL (ref 0.0–0.4)
Eos: 2 %
Hematocrit: 28.2 % — ABNORMAL LOW (ref 34.0–46.6)
Hemoglobin: 8.9 g/dL — ABNORMAL LOW (ref 11.1–15.9)
IMMATURE GRANS (ABS): 0 10*3/uL (ref 0.0–0.1)
Immature Granulocytes: 0 %
LYMPHS: 21 %
Lymphocytes Absolute: 0.8 10*3/uL (ref 0.7–3.1)
MCH: 25.1 pg — AB (ref 26.6–33.0)
MCHC: 31.6 g/dL (ref 31.5–35.7)
MCV: 79 fL (ref 79–97)
MONOS ABS: 0.2 10*3/uL (ref 0.1–0.9)
Monocytes: 6 %
NEUTROS ABS: 2.5 10*3/uL (ref 1.4–7.0)
Neutrophils: 71 %
PLATELETS: 177 10*3/uL (ref 150–379)
RBC: 3.55 x10E6/uL — ABNORMAL LOW (ref 3.77–5.28)
RDW: 16.7 % — ABNORMAL HIGH (ref 12.3–15.4)
WBC: 3.6 10*3/uL (ref 3.4–10.8)

## 2016-10-21 LAB — COMPREHENSIVE METABOLIC PANEL
A/G RATIO: 2.1 (ref 1.2–2.2)
ALT: 20 IU/L (ref 0–32)
AST: 15 IU/L (ref 0–40)
Albumin: 3.9 g/dL (ref 3.5–5.5)
Alkaline Phosphatase: 78 IU/L (ref 39–117)
BUN / CREAT RATIO: 17 (ref 9–23)
BUN: 13 mg/dL (ref 6–24)
Bilirubin Total: 0.2 mg/dL (ref 0.0–1.2)
CALCIUM: 8.6 mg/dL — AB (ref 8.7–10.2)
CO2: 23 mmol/L (ref 18–29)
Chloride: 103 mmol/L (ref 96–106)
Creatinine, Ser: 0.76 mg/dL (ref 0.57–1.00)
GFR, EST AFRICAN AMERICAN: 110 mL/min/{1.73_m2} (ref >59)
GFR, EST NON AFRICAN AMERICAN: 95 mL/min/{1.73_m2} (ref >59)
GLOBULIN, TOTAL: 1.9 g/dL (ref 1.5–4.5)
Glucose: 98 mg/dL (ref 65–99)
POTASSIUM: 3.8 mmol/L (ref 3.5–5.2)
SODIUM: 141 mmol/L (ref 134–144)
Total Protein: 5.8 g/dL — ABNORMAL LOW (ref 6.0–8.5)

## 2016-10-21 LAB — LACTATE DEHYDROGENASE: LDH: 146 IU/L (ref 119–226)

## 2016-10-21 LAB — RETICULOCYTES: Retic Ct Pct: 3.4 % — ABNORMAL HIGH (ref 0.6–2.6)

## 2016-10-23 ENCOUNTER — Telehealth: Payer: Self-pay | Admitting: *Deleted

## 2016-10-23 NOTE — Telephone Encounter (Signed)
Pt called / informed "Hb down a little from last time but no evidence for blood breakdown (hemolysis), and everything else stable" per Dr Beryle Beams.

## 2016-10-23 NOTE — Telephone Encounter (Signed)
-----   Message from Annia Belt, MD sent at 10/21/2016 11:56 AM EST ----- Call pt: Hb down a little from last time but no evidence for blood breakdown (hemolysis), and everything else stable

## 2016-11-03 ENCOUNTER — Encounter: Payer: Self-pay | Admitting: Oncology

## 2016-11-03 ENCOUNTER — Ambulatory Visit (INDEPENDENT_AMBULATORY_CARE_PROVIDER_SITE_OTHER): Payer: Commercial Managed Care - PPO | Admitting: Oncology

## 2016-11-03 VITALS — BP 131/67 | HR 74 | Temp 98.1°F | Ht 67.0 in | Wt 205.6 lb

## 2016-11-03 DIAGNOSIS — R768 Other specified abnormal immunological findings in serum: Secondary | ICD-10-CM

## 2016-11-03 DIAGNOSIS — C8307 Small cell B-cell lymphoma, spleen: Secondary | ICD-10-CM | POA: Diagnosis not present

## 2016-11-03 DIAGNOSIS — Z9221 Personal history of antineoplastic chemotherapy: Secondary | ICD-10-CM

## 2016-11-03 DIAGNOSIS — Z8719 Personal history of other diseases of the digestive system: Secondary | ICD-10-CM

## 2016-11-03 DIAGNOSIS — D72819 Decreased white blood cell count, unspecified: Secondary | ICD-10-CM | POA: Diagnosis not present

## 2016-11-03 DIAGNOSIS — D500 Iron deficiency anemia secondary to blood loss (chronic): Secondary | ICD-10-CM

## 2016-11-03 DIAGNOSIS — N920 Excessive and frequent menstruation with regular cycle: Secondary | ICD-10-CM

## 2016-11-03 DIAGNOSIS — R771 Abnormality of globulin: Secondary | ICD-10-CM

## 2016-11-03 DIAGNOSIS — D508 Other iron deficiency anemias: Secondary | ICD-10-CM

## 2016-11-03 MED ORDER — POLYSACCHARIDE IRON COMPLEX 150 MG PO CAPS: 150.0000 mg | ORAL_CAPSULE | Freq: Two times a day (BID) | ORAL | 11 refills | Status: DC

## 2016-11-03 NOTE — Patient Instructions (Addendum)
To lab today Schedule ultrasound of abdomen: complete - not limited at [facility] within next 2 weeks Repeat lab May 7 MD visit 1-2 weeks after lab

## 2016-11-04 LAB — CBC WITH DIFFERENTIAL/PLATELET
Basophils Absolute: 0 10*3/uL (ref 0.0–0.2)
Basos: 0 %
EOS (ABSOLUTE): 0.1 10*3/uL (ref 0.0–0.4)
EOS: 1 %
HEMATOCRIT: 29 % — AB (ref 34.0–46.6)
HEMOGLOBIN: 8.9 g/dL — AB (ref 11.1–15.9)
IMMATURE GRANULOCYTES: 0 %
Immature Grans (Abs): 0 10*3/uL (ref 0.0–0.1)
Lymphocytes Absolute: 0.7 10*3/uL (ref 0.7–3.1)
Lymphs: 18 %
MCH: 24 pg — ABNORMAL LOW (ref 26.6–33.0)
MCHC: 30.7 g/dL — ABNORMAL LOW (ref 31.5–35.7)
MCV: 78 fL — ABNORMAL LOW (ref 79–97)
MONOCYTES: 6 %
MONOS ABS: 0.2 10*3/uL (ref 0.1–0.9)
NEUTROS PCT: 75 %
Neutrophils Absolute: 3.1 10*3/uL (ref 1.4–7.0)
Platelets: 172 10*3/uL (ref 150–379)
RBC: 3.71 x10E6/uL — AB (ref 3.77–5.28)
RDW: 16.4 % — AB (ref 12.3–15.4)
WBC: 4.1 10*3/uL (ref 3.4–10.8)

## 2016-11-04 LAB — RETICULOCYTES: RETIC CT PCT: 3 % — AB (ref 0.6–2.6)

## 2016-11-04 LAB — IGG, IGA, IGM
IgA/Immunoglobulin A, Serum: 189 mg/dL (ref 87–352)
IgG (Immunoglobin G), Serum: 704 mg/dL (ref 700–1600)
IgM (Immunoglobulin M), Srm: 50 mg/dL (ref 26–217)

## 2016-11-04 LAB — DIRECT ANTIGLOBULIN TEST (NOT AT ARMC): Coombs', Direct: NEGATIVE

## 2016-11-04 LAB — IRON AND TIBC
Iron Saturation: 7 % — CL (ref 15–55)
Iron: 27 ug/dL (ref 27–159)
TIBC: 362 ug/dL (ref 250–450)
UIBC: 335 ug/dL (ref 131–425)

## 2016-11-04 LAB — FERRITIN: Ferritin: 6 ng/mL — ABNORMAL LOW (ref 15–150)

## 2016-11-04 NOTE — Progress Notes (Signed)
Hematology and Oncology Follow Up Visit  Kara Perkins [id number] [birth year] 46 y.o. 11/04/2016 3:14 PM   Principle Diagnosis: Encounter Diagnoses  Name Primary?  . Positive Coombs test   . Other iron deficiency anemia   . 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 Other iron deficiency anemia   . 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 She never demonstrated any signs of hemolysis. She was followed closely through the pregnancy and blood counts remained stable. Over the years, the positive Coombs test extinguished. As part of her initial evaluation, serum immunoglobulins were done. She had a mild elevation of IgM. No concomitant suppression of her other immunoglobulins. I had forgotten about this until she had some GI problems back in March of 2011 when she developed acute ulcerative esophagitis. As part of her GI evaluation immunoglobulin studies were done and she showed a persistent elevation of IgM. She was reevaluated. Initial value for IgM was 425 mg percent compared with 530 mg percent done by her gastroenterologist. IgM levels monitored more closely since that time. There was a slow but steady trend for the IgM levels to be going up. A single 24-hour total urine protein was elevated at 667 mg in a pattern of nonselective proteinuria on 12/30/2009 but this was not reproducible. Subsequent study done 07/17/2010 with total urine protein on 89 mg, on 02/06/2011 18 mg. There was a monoclonal IgM kappa paraprotein in the urine as well as serum. In addition to the slow rise in her IgM, there was also a trend for progressive anemia, intermittent leukopenia, and intermittent fall in her platelet count but no single platelet count has been less than 120,000. Interpreting her hemoglobin has been  complicated by the fact that she is chronically iron deficient from menorrhagia.  CT scan of the abdomen and pelvis done 01/02/2010 did not show any lymphadenopathy or splenomegaly at that time. Bone marrow aspiration and biopsy was done on 12/11/2011. There was no evidence for either Waldenstrm's or multiple myeloma on that specimen.  She remained asymptomatic. However, lab done in May, 2015 showed progressive leukopenia with total white count 3,100, 44% neutrophils, 8% lymphocytes, I percent monocytes, 3 eosinophils. Platelet count down from 141,000 and October 2014 to 120,000 . Hemoglobin has come up from 8.7 g to 9.8 g on oral iron replacement.  I detected splenomegaly 5 cm below left costal margin on my 02/21/2014 exam. This was confirmed on a CT scan which showed spleen measuring 23 cm. Repeat bone marrow aspiration and biopsy done 03/23/2014 showed a monoclonal population of B lymphocytes expressing CD20 negative for CD5 and CD20 5. No gross lymphoid aggregates or infiltrates. 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 I reviewed treatment options at length with the patient and her husband.I recommended splenectomy as my first choice as opposed to chemotherapy. However the patient elected to take a chemotherapy program. Treatment administered by Dr. Heath Lark. She completed a course of treatment with cladribine 0.1 mg per kilogram IV plus Rituxan 38m per meter squared both given weekly x6 weeks between July 16 and 06/01/2014 She had no clinical lymphadenopathy but a staging PET scan done on 04/19/2014 other than showing an enlarged, hypermetabolic, spleen 23 cm in maximum dimension, also showed activity in bilateral axillary lymph glands and  in addition, normal sized but hypermetabolic lymph glands along the chest wall, in the upper abdomen, and pelvis. She tolerated treatments well. She developed transient mild leukopenia with lowest white count 2200, and mild  thrombocytopenia with lowest platelet count 110,000. White count recovered to 4000 with 80% neutrophils, and platelets 162,000 as of 07/31/2014. She did not get any infections. She did not require hospitalization. IgM paraprotein normalized from peak pretreatment value of 1130 mg percent to most recent value of 192 on 07/31/14 . PET scan showed complete metabolic response in all lymph node areas and the spleen, but persistent mild splenomegaly but reduction down from 23 cm to 15 cm. Spleen size has remained stable at 15 cm through most recent CT scan done on 04/23/2016 and ultrasound done 08/11/2016   Interim History:   At the time of her most recent visit with me in October 2017, she was having some intermittent left upper quadrant discomfort. I got an abdominal ultrasound to reassess her spleen. Although not directly comparable to a CT scan, spleen length proximally 14.3 cm compared with 15.3 cm on the CT scan from July 2017. No intra-abdominal adenopathy. No focal splenic lesions. Symptoms subsequently resolved spontaneously. She has had no interim medical issues. No flareups of her GERD. No change in bowel habit. No constitutional symptoms. Lab done in anticipation of today's visit showed a 1 g fall in her hemoglobin from 9.9 in October down to 8.9. No change on repeat today. No evidence for a hemolytic process in this woman who has had intermittent Coombs positivity in the past. Bilirubin 0.2. LDH 146. Reticulocyte count 3.4%. MCV 78 so I suspect she continues to malabsorb iron although she is compliant with an oral iron supplement at present. Her periods have been irregular. She is likely entering the menopause. Her last cycle lasted 10 days. Cycle prior to that only 3 days. I checked a ferritin today and in fact he remains very low at 6.  One of her daughters is involved with a Psychiatric nurse team and she will be traveling with her mom to multiple states over the next year. Her other daughter  is starting [employer].    Medications: reviewed  Allergies: No Known Allergies  Review of Systems: See interim history. Remaining ROS negative:   Physical Exam: Blood pressure 131/67, pulse 74, temperature 98.1 F (36.7 C), temperature source Oral, height '5\' 7"'  (1.702 m), weight 205 lb 9.6 oz (93.3 kg), SpO2 100 %. Wt Readings from Last 3 Encounters:  11/03/16 205 lb 9.6 oz (93.3 kg)  08/05/16 201 lb 6.4 oz (91.4 kg)  02/04/16 205 lb (93 kg)     General appearance: Overweight Caucasian woman HENNT: Pharynx no erythema, exudate, mass, or ulcer. No thyromegaly or thyroid nodules Lymph nodes: No cervical, supraclavicular, or axillary lymphadenopathy Breasts:  Lungs: Clear to auscultation, resonant to percussion throughout Heart: Regular rhythm, no murmur, no gallop, no rub, no click, no edema Abdomen: Soft, nontender, normal bowel sounds, no mass, no organomegaly Extremities: No edema, no calf tenderness Musculoskeletal: no joint deformities GU:  Vascular: Carotid pulses 2+, no bruits,  Neurologic: Alert, oriented, PERRLA, optic discs sharp and vessels normal, no hemorrhage or exudate, cranial nerves grossly normal, motor strength 5 over 5, reflexes 1+ symmetric, upper body coordination normal, gait normal, Skin: No rash or ecchymosis  Lab Results: CBC W/Diff    Component Value Date/Time   WBC 4.1 11/03/2016 0915   WBC 4.0 05/07/2015 1330   RBC 3.71 (L) 11/03/2016 0915  RBC 3.60 (L) 05/07/2015 1330   HGB 9.1 (L) 05/07/2015 1330   HGB 11.3 (L) 01/30/2015 0923   HCT 29.0 (L) 11/03/2016 0915   HCT 34.9 01/30/2015 0923   PLT 172 11/03/2016 0915   MCV 78 (L) 11/03/2016 0915   MCV 81.7 01/30/2015 0923   MCH 24.0 (L) 11/03/2016 0915   MCH 25.3 (L) 05/07/2015 1330   MCHC 30.7 (L) 11/03/2016 0915   MCHC 31.9 05/07/2015 1330   RDW 16.4 (H) 11/03/2016 0915   RDW 15.7 (H) 01/30/2015 0923   LYMPHSABS 0.7 11/03/2016 0915   LYMPHSABS 0.3 (L) 01/30/2015 0923   MONOABS 0.4  05/07/2015 1330   MONOABS 0.2 01/30/2015 0923   EOSABS 0.1 11/03/2016 0915   BASOSABS 0.0 11/03/2016 0915   BASOSABS 0.0 01/30/2015 0923     Chemistry      Component Value Date/Time   NA 141 10/20/2016 0832   NA 139 01/30/2015 0924   K 3.8 10/20/2016 0832   K 4.8 01/30/2015 0924   CL 103 10/20/2016 0832   CO2 23 10/20/2016 0832   CO2 25 01/30/2015 0924   BUN 13 10/20/2016 0832   BUN 11.5 01/30/2015 0924   CREATININE 0.76 10/20/2016 0832   CREATININE 0.8 01/30/2015 0924      Component Value Date/Time   CALCIUM 8.6 (L) 10/20/2016 0832   CALCIUM 9.1 01/30/2015 0924   ALKPHOS 78 10/20/2016 0832   ALKPHOS 77 01/30/2015 0924   AST 15 10/20/2016 0832   AST 21 01/30/2015 0924   ALT 20 10/20/2016 0832   ALT 27 01/30/2015 0924   BILITOT 0.2 10/20/2016 0832   BILITOT 0.48 01/30/2015 0924    IgM level 704 mg percent, low normal, stable compared with values done over the last year. Ferritin 6 Direct antiglobulin test (Coombs) negative  Radiological Studies: See discussion above.  Impression:  #1. Marginal Zone low-grade non-Hodgkin's lymphoma treated as outlined above. No evidence for progression now out 29 months from completion of cladribine chemotherapy and Rituxan immunotherapy in August 2015. Plan: Continued periodic follow-up.  #2. 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 Currently negative and no evidence for hemolysis.  #3. Elevated IgM paraprotein-paraneoplastic manifestation of #1. IgM remains in low normal range following treatment.  #4. Iron deficiency anemia secondary to menorrhagia. No response to oral iron replacement. We will arrange for parenteral iron.  #5. History of ulcerative esophagitis. Currently asymptomatic.  #6. Persistent mild leukopenia: Likely footprint of chemotherapy. Stable to improved over time.   CC: Patient Care Team: Sofie Hartigan, MD as PCP - General (Family Medicine)   Annia Belt,  MD 1/23/20183:14 PM

## 2016-11-07 ENCOUNTER — Telehealth: Payer: Self-pay | Admitting: *Deleted

## 2016-11-07 NOTE — Telephone Encounter (Signed)
Pt called / informed "ferritin iron storage protein still very low" ANd need for IV iron per Dr Beryle Beams. Pt also asked about IGM, Hgb results. Appt for IV iron scheduled Tuesday 11/11/16 @ 0800AM at [place].

## 2016-11-07 NOTE — Telephone Encounter (Signed)
-----   Message from Annia Belt, MD sent at 11/05/2016  5:14 PM EST ----- Call pt: ferritin iron storage protein still very low. I would like to give her 2 doses of IV iron a week apart. Let us know what days would work for her then let me know so I can put in orders.

## 2016-11-10 ENCOUNTER — Other Ambulatory Visit (HOSPITAL_COMMUNITY): Payer: Self-pay | Admitting: *Deleted

## 2016-11-11 ENCOUNTER — Encounter (HOSPITAL_COMMUNITY)
Admission: RE | Admit: 2016-11-11 | Discharge: 2016-11-11 | Disposition: A | Discharge status: Home / Self Care | Payer: Commercial Managed Care - PPO | Source: Ambulatory Visit | Attending: Oncology | Admitting: Oncology

## 2016-11-11 DIAGNOSIS — D509 Iron deficiency anemia, unspecified: Secondary | ICD-10-CM | POA: Insufficient documentation

## 2016-11-11 MED ORDER — SODIUM CHLORIDE 0.9 % IV SOLN
510.0000 mg | INTRAVENOUS | Status: DC
  Administered 2016-11-11: 510 mg via INTRAVENOUS
  Filled 2016-11-11: qty 17

## 2016-11-11 NOTE — Discharge Instructions (Signed)
Ferumoxytol injection What is this medicine? FERUMOXYTOL is an iron complex. Iron is used to make healthy red blood cells, which carry oxygen and nutrients throughout the body. This medicine is used to treat iron deficiency anemia in people with chronic kidney disease. COMMON BRAND NAME(S): Feraheme What should I tell my health care provider before I take this medicine? They need to know if you have any of these conditions: -anemia not caused by low iron levels -high levels of iron in the blood -magnetic resonance imaging (MRI) test scheduled -an unusual or allergic reaction to iron, other medicines, foods, dyes, or preservatives -pregnant or trying to get pregnant -breast-feeding How should I use this medicine? This medicine is for injection into a vein. It is given by a health care professional in a hospital or clinic setting. Talk to your pediatrician regarding the use of this medicine in children. Special care may be needed. What if I miss a dose? It is important not to miss your dose. Call your doctor or health care professional if you are unable to keep an appointment. What may interact with this medicine? This medicine may interact with the following medications: -other iron products What should I watch for while using this medicine? Visit your doctor or healthcare professional regularly. Tell your doctor or healthcare professional if your symptoms do not start to get better or if they get worse. You may need blood work done while you are taking this medicine. You may need to follow a special diet. Talk to your doctor. Foods that contain iron include: whole grains/cereals, dried fruits, beans, or peas, leafy green vegetables, and organ meats (liver, kidney). What side effects may I notice from receiving this medicine? Side effects that you should report to your doctor or health care professional as soon as possible: -allergic reactions like skin rash, itching or hives, swelling of the  face, lips, or tongue -breathing problems -changes in blood pressure -feeling faint or lightheaded, falls -fever or chills -flushing, sweating, or hot feelings -swelling of the ankles or feet Side effects that usually do not require medical attention (report to your doctor or health care professional if they continue or are bothersome): -diarrhea -headache -nausea, vomiting -stomach pain Where should I keep my medicine? This drug is given in a hospital or clinic and will not be stored at home.  2017 Elsevier/Gold Standard (2015-11-01 12:41:49)  

## 2016-11-17 ENCOUNTER — Other Ambulatory Visit (HOSPITAL_COMMUNITY): Payer: Self-pay | Admitting: *Deleted

## 2016-11-18 ENCOUNTER — Encounter (HOSPITAL_COMMUNITY)
Admission: RE | Admit: 2016-11-18 | Discharge: 2016-11-18 | Disposition: A | Discharge status: Home / Self Care | Payer: Commercial Managed Care - PPO | Source: Ambulatory Visit | Attending: Oncology | Admitting: Oncology

## 2016-11-18 ENCOUNTER — Ambulatory Visit (HOSPITAL_COMMUNITY)
Admission: RE | Admit: 2016-11-18 | Discharge: 2016-11-18 | Disposition: A | Discharge status: Home / Self Care | Payer: Commercial Managed Care - PPO | Source: Ambulatory Visit | Attending: Oncology | Admitting: Oncology

## 2016-11-18 DIAGNOSIS — Z9049 Acquired absence of other specified parts of digestive tract: Secondary | ICD-10-CM | POA: Diagnosis not present

## 2016-11-18 DIAGNOSIS — C8307 Small cell B-cell lymphoma, spleen: Secondary | ICD-10-CM | POA: Insufficient documentation

## 2016-11-18 DIAGNOSIS — R161 Splenomegaly, not elsewhere classified: Secondary | ICD-10-CM | POA: Diagnosis not present

## 2016-11-18 DIAGNOSIS — D500 Iron deficiency anemia secondary to blood loss (chronic): Secondary | ICD-10-CM | POA: Diagnosis not present

## 2016-11-18 DIAGNOSIS — K760 Fatty (change of) liver, not elsewhere classified: Secondary | ICD-10-CM | POA: Diagnosis not present

## 2016-11-18 DIAGNOSIS — D508 Other iron deficiency anemias: Secondary | ICD-10-CM | POA: Insufficient documentation

## 2016-11-18 MED ORDER — SODIUM CHLORIDE 0.9 % IV SOLN
510.0000 mg | INTRAVENOUS | Status: AC
  Administered 2016-11-18: 510 mg via INTRAVENOUS
  Filled 2016-11-18: qty 17

## 2016-11-21 ENCOUNTER — Telehealth: Payer: Self-pay | Admitting: *Deleted

## 2016-11-21 NOTE — Telephone Encounter (Signed)
-----   Message from Annia Belt, MD sent at 11/18/2016  5:13 PM EST ----- Call pt: spleen size stable compared to previous

## 2016-11-21 NOTE — Telephone Encounter (Signed)
Pt called / informed "spleen size stable compared to previous" per Dr Beryle Beams. Wanted to know to repeat labs after having iron infusions. Told her per last note - needs labs on May 7 then OV. She will call back to schedule OV.

## 2016-12-05 NOTE — H&P (Signed)
Kara Perkins is an 46 y.o. female with heavy menstrual bleeding. Lysteda and Megace both fail to control bleeding and severe cramping. U/S in office 2/23/218 showed uterus 7.0x6.2x7.7 cm. Ovaries normal, no free fluid. On SHG endometrium is 9.8 mm thick with a 4mm possible flat polyp in anterior superior cavity. History of marginal zone lymphoma and received iron infusion per Dr Beryle Beams recently.  Pertinent Gynecological History: Menses: flow is excessive with use of many pads or tampons on heaviest days Bleeding: dysfunctional uterine bleeding Contraception: vasectomy DES exposure: denies Blood transfusions: none Sexually transmitted diseases: no past history Previous GYN Procedures: none  Last mammogram: normal Date: 2015 Last pap: normal Date: 2017 OB History: G2, P2   Menstrual History: Menarche age: unknown No LMP recorded.    Past Medical History:  Diagnosis Date  . Constipation 05/11/2014  . 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 Iron deficiency anemia 11/05/2011  . 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 Perimenopausal menorrhagia 07/26/2013  . Positive Coombs test   . Positive Coombs test 11/05/2011    Past Surgical History:  Procedure Laterality Date  . CHOLECYSTECTOMY      No family history on file.  Social History:  reports that she has never smoked. She has never used smokeless tobacco. She reports that she does not drink alcohol or use drugs.  Allergies: No Known Allergies  No prescriptions prior to admission.    Review of Systems  Constitutional: Negative for fever.    There were no vitals taken for this visit. Physical Exam  Cardiovascular: Normal rate and regular rhythm.   Respiratory: Effort normal and breath sounds normal.  GI: Soft. There is no tenderness.  Genitourinary:  Genitourinary Comments: Uterus mobile and NT Adnexa without masses Clot per cervical os  Neurological: She has normal  reflexes.    No results found for this or any previous visit (from the past 24 hour(s)).  No results found.  Assessment/Plan: 46 yo G2P2 with menometorrhagia uncontrolled by oral medication Suggestion of endometrial polyp on U/S Hysteroscopy, D&C and possible Myosure D/W patient-risks reviewed including infection, organ damage, bleeding/transfusion-HIV/RPR, DVT/PE, pneumonia. Patient states she understands and agrees.  Allison Silva II,Dallan Schonberg E 12/05/2016, 2:54 PM

## 2016-12-06 ENCOUNTER — Inpatient Hospital Stay (HOSPITAL_COMMUNITY)
Admission: AD | Admit: 2016-12-06 | Discharge: 2016-12-06 | Disposition: A | Discharge status: Home / Self Care | Payer: Commercial Managed Care - PPO | Source: Ambulatory Visit | Attending: Obstetrics and Gynecology | Admitting: Obstetrics and Gynecology

## 2016-12-06 ENCOUNTER — Ambulatory Visit (HOSPITAL_COMMUNITY)
Admission: RE | Admit: 2016-12-06 | Payer: Commercial Managed Care - PPO | Source: Ambulatory Visit | Admitting: Obstetrics and Gynecology

## 2016-12-06 ENCOUNTER — Inpatient Hospital Stay (HOSPITAL_COMMUNITY): Payer: Commercial Managed Care - PPO | Admitting: Certified Registered Nurse Anesthetist

## 2016-12-06 ENCOUNTER — Encounter (HOSPITAL_COMMUNITY)
Admission: AD | Disposition: A | Discharge status: Home / Self Care | Payer: Self-pay | Source: Ambulatory Visit | Attending: Obstetrics and Gynecology

## 2016-12-06 ENCOUNTER — Encounter (HOSPITAL_COMMUNITY): Payer: Self-pay | Admitting: *Deleted

## 2016-12-06 DIAGNOSIS — N921 Excessive and frequent menstruation with irregular cycle: Secondary | ICD-10-CM | POA: Diagnosis present

## 2016-12-06 HISTORY — PX: DILATATION & CURETTAGE/HYSTEROSCOPY WITH MYOSURE: SHX6511

## 2016-12-06 LAB — TYPE AND SCREEN
ABO/RH(D): A POS
ANTIBODY SCREEN: NEGATIVE
WEAK D: POSITIVE

## 2016-12-06 LAB — CBC
HCT: 28.8 % — ABNORMAL LOW (ref 36.0–46.0)
Hemoglobin: 9.8 g/dL — ABNORMAL LOW (ref 12.0–15.0)
MCH: 27.8 pg (ref 26.0–34.0)
MCHC: 34 g/dL (ref 30.0–36.0)
MCV: 81.6 fL (ref 78.0–100.0)
PLATELETS: 159 10*3/uL (ref 150–400)
RBC: 3.53 MIL/uL — AB (ref 3.87–5.11)
RDW: 20.4 % — AB (ref 11.5–15.5)
WBC: 3.5 10*3/uL — AB (ref 4.0–10.5)

## 2016-12-06 LAB — ABO/RH: ABO/RH(D): A POS

## 2016-12-06 SURGERY — DILATATION & CURETTAGE/HYSTEROSCOPY WITH MYOSURE
Anesthesia: General | Site: Uterus

## 2016-12-06 MED ORDER — LACTATED RINGERS IV SOLN
INTRAVENOUS | Status: DC
  Administered 2016-12-06: 10:00:00 via INTRAVENOUS

## 2016-12-06 MED ORDER — ONDANSETRON HCL 4 MG/2ML IJ SOLN
INTRAMUSCULAR | Status: DC | PRN
  Administered 2016-12-06: 4 mg via INTRAVENOUS

## 2016-12-06 MED ORDER — FENTANYL CITRATE (PF) 100 MCG/2ML IJ SOLN
INTRAMUSCULAR | Status: DC | PRN
  Administered 2016-12-06 (×2): 50 ug via INTRAVENOUS

## 2016-12-06 MED ORDER — OXYCODONE-ACETAMINOPHEN 5-325 MG PO TABS
1.0000 | ORAL_TABLET | ORAL | Status: DC | PRN
  Administered 2016-12-06: 1 via ORAL

## 2016-12-06 MED ORDER — LIDOCAINE HCL (CARDIAC) 20 MG/ML IV SOLN
INTRAVENOUS | Status: DC | PRN
  Administered 2016-12-06: 100 mg via INTRAVENOUS

## 2016-12-06 MED ORDER — LIDOCAINE HCL 1 % IJ SOLN
INTRAMUSCULAR | Status: DC | PRN
  Administered 2016-12-06: 20 mL

## 2016-12-06 MED ORDER — DEXAMETHASONE SODIUM PHOSPHATE 10 MG/ML IJ SOLN
INTRAMUSCULAR | Status: DC | PRN
  Administered 2016-12-06: 4 mg via INTRAVENOUS

## 2016-12-06 MED ORDER — FENTANYL CITRATE (PF) 100 MCG/2ML IJ SOLN: 25.0000 ug | INTRAMUSCULAR | Status: DC | PRN

## 2016-12-06 MED ORDER — SODIUM CHLORIDE 0.9 % IR SOLN
Status: DC | PRN
  Administered 2016-12-06: 3000 mL

## 2016-12-06 MED ORDER — SCOPOLAMINE 1 MG/3DAYS TD PT72
1.0000 | MEDICATED_PATCH | TRANSDERMAL | Status: DC
  Administered 2016-12-06: 1.5 mg via TRANSDERMAL

## 2016-12-06 MED ORDER — CEFAZOLIN SODIUM-DEXTROSE 2-4 GM/100ML-% IV SOLN
2.0000 g | INTRAVENOUS | Status: DC
  Filled 2016-12-06: qty 100

## 2016-12-06 MED ORDER — KETOROLAC TROMETHAMINE 30 MG/ML IJ SOLN
INTRAMUSCULAR | Status: DC | PRN
  Administered 2016-12-06: 30 mg via INTRAVENOUS

## 2016-12-06 MED ORDER — PROMETHAZINE HCL 25 MG/ML IJ SOLN: 6.2500 mg | INTRAMUSCULAR | Status: DC | PRN

## 2016-12-06 MED ORDER — MIDAZOLAM HCL 2 MG/2ML IJ SOLN
INTRAMUSCULAR | Status: DC | PRN
  Administered 2016-12-06: 2 mg via INTRAVENOUS

## 2016-12-06 MED ORDER — CEFAZOLIN SODIUM-DEXTROSE 2-3 GM-% IV SOLR
INTRAVENOUS | Status: DC | PRN
  Administered 2016-12-06: 2 g via INTRAVENOUS

## 2016-12-06 MED ORDER — FAMOTIDINE IN NACL 20-0.9 MG/50ML-% IV SOLN
20.0000 mg | Freq: Once | INTRAVENOUS | Status: AC
  Administered 2016-12-06: 20 mg via INTRAVENOUS
  Filled 2016-12-06: qty 50

## 2016-12-06 MED ORDER — PROPOFOL 10 MG/ML IV BOLUS
INTRAVENOUS | Status: DC | PRN
  Administered 2016-12-06: 200 mg via INTRAVENOUS

## 2016-12-06 SURGICAL SUPPLY — 17 items
CATH ROBINSON RED A/P 16FR (CATHETERS) ×3 IMPLANT
CLOTH BEACON ORANGE TIMEOUT ST (SAFETY) ×3 IMPLANT
CONTAINER PREFILL 10% NBF 60ML (FORM) ×6 IMPLANT
DEVICE MYOSURE LITE (MISCELLANEOUS) ×3 IMPLANT
DEVICE MYOSURE REACH (MISCELLANEOUS) IMPLANT
FILTER ARTHROSCOPY CONVERTOR (FILTER) ×3 IMPLANT
GLOVE BIO SURGEON STRL SZ8 (GLOVE) ×6 IMPLANT
GLOVE BIOGEL PI IND STRL 7.0 (GLOVE) ×1 IMPLANT
GLOVE BIOGEL PI INDICATOR 7.0 (GLOVE) ×2
GOWN STRL REUS W/TWL LRG LVL3 (GOWN DISPOSABLE) ×6 IMPLANT
PACK VAGINAL MINOR WOMEN LF (CUSTOM PROCEDURE TRAY) ×3 IMPLANT
PAD OB MATERNITY 4.3X12.25 (PERSONAL CARE ITEMS) ×3 IMPLANT
SEAL ROD LENS SCOPE MYOSURE (ABLATOR) ×3 IMPLANT
TOWEL OR 17X24 6PK STRL BLUE (TOWEL DISPOSABLE) ×6 IMPLANT
TUBING AQUILEX INFLOW (TUBING) ×3 IMPLANT
TUBING AQUILEX OUTFLOW (TUBING) ×3 IMPLANT
WATER STERILE IRR 1000ML POUR (IV SOLUTION) ×3 IMPLANT

## 2016-12-06 NOTE — Progress Notes (Signed)
No changes to H&P per patient history Reviewed with patient procedure-Hysteroscopy, dilation/currettage, possible Myosure Patient states she understands and agrees Post operative instructions reviewed

## 2016-12-06 NOTE — Anesthesia Postprocedure Evaluation (Signed)
Anesthesia Post Note  Patient: Kara Perkins  Procedure(s) Performed: Procedure(s) (LRB): DILATATION & CURETTAGE/HYSTEROSCOPY WITH MYOSURE (N/A)  Patient location during evaluation: PACU Anesthesia Type: General Level of consciousness: awake and alert Pain management: pain level controlled Vital Signs Assessment: post-procedure vital signs reviewed and stable Respiratory status: spontaneous breathing, nonlabored ventilation, respiratory function stable and patient connected to nasal cannula oxygen Cardiovascular status: blood pressure returned to baseline and stable Postop Assessment: no signs of nausea or vomiting Anesthetic complications: no        Last Vitals:  Vitals:   12/06/16 1130 12/06/16 1145  BP: 127/70 (!) 113/57  Pulse: 72 66  Resp: 14 16  Temp:      Last Pain:  Vitals:   12/06/16 1145  PainSc: 3    Pain Goal:                 Catalina Gravel

## 2016-12-06 NOTE — Discharge Instructions (Signed)
Do NOT take Motrin until after 4:00 pm   General Anesthesia, Adult, Care After These instructions provide you with information about caring for yourself after your procedure. Your health care provider may also give you more specific instructions. Your treatment has been planned according to current medical practices, but problems sometimes occur. Call your health care provider if you have any problems or questions after your procedure. What can I expect after the procedure? After the procedure, it is common to have:  Vomiting.  A sore throat.  Mental slowness. It is common to feel:  Nauseous.  Cold or shivery.  Sleepy.  Tired.  Sore or achy, even in parts of your body where you did not have surgery. Follow these instructions at home: For at least 24 hours after the procedure:  Do not:  Participate in activities where you could fall or become injured.  Drive.  Use heavy machinery.  Drink alcohol.  Take sleeping pills or medicines that cause drowsiness.  Make important decisions or sign legal documents.  Take care of children on your own.  Rest. Eating and drinking  If you vomit, drink water, juice, or soup when you can drink without vomiting.  Drink enough fluid to keep your urine clear or pale yellow.  Make sure you have little or no nausea before eating solid foods.  Follow the diet recommended by your health care provider. General instructions  Have a responsible adult stay with you until you are awake and alert.  Return to your normal activities as told by your health care provider. Ask your health care provider what activities are safe for you.  Take over-the-counter and prescription medicines only as told by your health care provider.  If you smoke, do not smoke without supervision.  Keep all follow-up visits as told by your health care provider. This is important. Contact a health care provider if:  You continue to have nausea or vomiting at  home, and medicines are not helpful.  You cannot drink fluids or start eating again.  You cannot urinate after 8-12 hours.  You develop a skin rash.  You have fever.  You have increasing redness at the site of your procedure. Get help right away if:  You have difficulty breathing.  You have chest pain.  You have unexpected bleeding.  You feel that you are having a life-threatening or urgent problem. This information is not intended to replace advice given to you by your health care provider. Make sure you discuss any questions you have with your health care provider. Document Released: 01/05/2001 Document Revised: 03/03/2016 Document Reviewed: 09/13/2015 Elsevier Interactive Patient Education  2017 [employer] Dilation and Curettage or Vacuum Curettage, Care After This sheet gives you information about how to care for yourself after your procedure. Your health care provider may also give you more specific instructions. If you have problems or questions, contact your health care provider. What can I expect after the procedure? After your procedure, it is common to have:  Mild pain or cramping.  Some vaginal bleeding or spotting. These may last for up to 2 weeks after your procedure. Follow these instructions at home: Activity  Do not drive or use heavy machinery while taking prescription pain medicine.  Avoid driving for the first 24 hours after your procedure.  Take frequent, short walks, followed by rest periods, throughout the day. Ask your health care provider what activities are safe for you. After 1-2 days, you may be able to return to your normal  activities.  Do not lift anything heavier than 10 lb (4.5 kg) until your health care provider approves.  For at least 2 weeks, or as long as told by your health care provider, do not:  Douche.  Use tampons.  Have sexual intercourse. General instructions  Take over-the-counter and prescription medicines only as  told by your health care provider. This is especially important if you take blood thinning medicine.  Do not take baths, swim, or use a hot tub until your health care provider approves. Take showers instead of baths.  Wear compression stockings as told by your health care provider. These stockings help to prevent blood clots and reduce swelling in your legs.  It is your responsibility to get the results of your procedure. Ask your health care provider, or the department performing the procedure, when your results will be ready.  Keep all follow-up visits as told by your health care provider. This is important. Contact a health care provider if:  You have severe cramps that get worse or that do not get better with medicine.  You have severe abdominal pain.  You cannot drink fluids without vomiting.  You develop pain in a different area of your pelvis.  You have bad-smelling vaginal discharge.  You have a rash. Get help right away if:  You have vaginal bleeding that soaks more than one sanitary pad in 1 hour, for 2 hours in a row.  You pass large blood clots from your vagina.  You have a fever that is above 100.49F (38.0C).  Your abdomen feels very tender or hard.  You have chest pain.  You have shortness of breath.  You cough up blood.  You feel dizzy or light-headed.  You faint.  You have pain in your neck or shoulder area. This information is not intended to replace advice given to you by your health care provider. Make sure you discuss any questions you have with your health care provider. Document Released: 09/26/2000 Document Revised: 05/28/2016 Document Reviewed: 05/01/2016 Elsevier Interactive Patient Education  2017 [employer].

## 2016-12-06 NOTE — Anesthesia Preprocedure Evaluation (Signed)
Anesthesia Evaluation  Patient identified by MRN, date of birth, ID band Patient awake    Reviewed: Allergy & Precautions, NPO status , Patient's Chart, lab work & pertinent test results  Airway Mallampati: II  TM Distance: >3 FB Neck ROM: Full    Dental  (+) Teeth Intact, Dental Advisory Given   Pulmonary neg pulmonary ROS,    Pulmonary exam normal breath sounds clear to auscultation       Cardiovascular Exercise Tolerance: Good negative cardio ROS Normal cardiovascular exam Rhythm:Regular Rate:Normal     Neuro/Psych negative neurological ROS  negative psych ROS   GI/Hepatic negative GI ROS, Neg liver ROS,   Endo/Other  Obesity   Renal/GU negative Renal ROS     Musculoskeletal negative musculoskeletal ROS (+)   Abdominal   Peds  Hematology  (+) Blood dyscrasia, anemia , IgM monoclonal gammopathy  Lymphoma   Anesthesia Other Findings Day of surgery medications reviewed with the patient.  Reproductive/Obstetrics DUB                             Anesthesia Physical Anesthesia Plan  ASA: II  Anesthesia Plan: General   Post-op Pain Management:    Induction: Intravenous  Airway Management Planned: LMA  Additional Equipment:   Intra-op Plan:   Post-operative Plan: Extubation in OR  Informed Consent: I have reviewed the patients History and Physical, chart, labs and discussed the procedure including the risks, benefits and alternatives for the proposed anesthesia with the patient or authorized representative who has indicated his/her understanding and acceptance.   Dental advisory given  Plan Discussed with: CRNA  Anesthesia Plan Comments: (Risks/benefits of general anesthesia discussed with patient including risk of damage to teeth, lips, gum, and tongue, nausea/vomiting, allergic reactions to medications, and the possibility of heart attack, stroke and death.  All patient  questions answered.  Patient wishes to proceed.)        Anesthesia Quick Evaluation

## 2016-12-06 NOTE — Transfer of Care (Signed)
Immediate Anesthesia Transfer of Care Note  Patient: Kara Perkins  Procedure(s) Performed: Procedure(s) with comments: DILATATION & CURETTAGE/HYSTEROSCOPY WITH MYOSURE (N/A) - [place]  Patient Location: PACU  Anesthesia Type:General  Level of Consciousness: awake, alert  and oriented  Airway & Oxygen Therapy: Patient Spontanous Breathing and Patient connected to nasal cannula oxygen  Post-op Assessment: Report given to RN, Post -op Vital signs reviewed and stable and Patient moving all extremities  Post vital signs: Reviewed and stable  Last Vitals:  Vitals:   12/06/16 0905  BP: 119/66  Pulse: 70  Resp: 18  Temp: 36.7 C    Last Pain: There were no vitals filed for this visit.       Complications: No apparent anesthesia complications

## 2016-12-06 NOTE — Anesthesia Procedure Notes (Signed)
Procedure Name: LMA Insertion Date/Time: 12/06/2016 10:35 AM Performed by: Hewitt Blade Pre-anesthesia Checklist: Patient identified, Emergency Drugs available, Suction available and Patient being monitored Patient Re-evaluated:Patient Re-evaluated prior to inductionOxygen Delivery Method: Circle system utilized Preoxygenation: Pre-oxygenation with 100% oxygen Intubation Type: IV induction LMA: LMA inserted LMA Size: 4.0 Number of attempts: 1 Placement Confirmation: positive ETCO2 and breath sounds checked- equal and bilateral Tube secured with: Tape Dental Injury: Teeth and Oropharynx as per pre-operative assessment

## 2016-12-06 NOTE — Brief Op Note (Signed)
12/06/2016  11:10 AM  PATIENT:  Kara Perkins  46 y.o. female  PRE-OPERATIVE DIAGNOSIS:  menorrhagia  POST-OPERATIVE DIAGNOSIS:  MENORRHAGIA  PROCEDURE:  Procedure(s) with comments: DILATATION & CURETTAGE/HYSTEROSCOPY WITH MYOSURE (N/A) - HOLD MYSOSURE ITEMS  SURGEON:  Surgeon(s) and Role:    * Everlene Farrier, MD - Primary  PHYSICIAN ASSISTANT:   ASSISTANTS: none   ANESTHESIA:   IV sedation  EBL:  Total I/O In: 500 [I.V.:500] Out: 200 [Urine:150; Blood:50]  BLOOD ADMINISTERED:none  DRAINS: none   LOCAL MEDICATIONS USED:  LIDOCAINE  and Amount: 20 ml  SPECIMEN:  Source of Specimen:  endometrial currettings, endometrial resection  DISPOSITION OF SPECIMEN:  PATHOLOGY  COUNTS:  YES  TOURNIQUET:  * No tourniquets in log *  DICTATION: .Other Dictation: Dictation Number [id number]  PLAN OF CARE: Discharge to home after PACU  PATIENT DISPOSITION:  PACU - hemodynamically stable.   Delay start of Pharmacological VTE agent (>24hrs) due to surgical blood loss or risk of bleeding: not applicable

## 2016-12-06 NOTE — MAU Note (Signed)
Pt presents to MAU for scheduled D&C this morning. Denies any pain or VB

## 2016-12-08 ENCOUNTER — Encounter (HOSPITAL_COMMUNITY): Payer: Self-pay | Admitting: Obstetrics and Gynecology

## 2016-12-08 NOTE — Op Note (Signed)
NAMECAITRIONA, Kara Perkins            ACCOUNT NO.:  [id number]  MEDICAL RECORD NO.:  [id number]  LOCATION:  PERIO                         FACILITY:  [facility]  PHYSICIAN:  Daleen Bo. Gaetano Net, M.D. DATE OF BIRTH:  [birth year]  DATE OF PROCEDURE:  12/06/2016 DATE OF DISCHARGE:                              OPERATIVE REPORT   LOCATION:  [facility], [place], [place].  PREOPERATIVE DIAGNOSIS:  Menometrorrhagia.  POSTOPERATIVE DIAGNOSIS:  Menometrorrhagia.  PROCEDURE: 1. Hysteroscopy with MyoSure resection. 2. Dilation and curettage.  SURGEON:  Daleen Bo. Gaetano Net, M.D.  ANESTHESIA:  LMA.  SPECIMENS:  Endometrial curettings and endometrial resection, both to Pathology.  ESTIMATED BLOOD LOSS:  50 mL.  I'S AND O'S:  200 mL deficit on insufflation fluid.  INDICATIONS AND CONSENT:  This patient is a 46 year old patient with heavy irregular menses.  Details are dictated in the history and physical.  Hysteroscopy, D and C, and possible MyoSure resection were discussed preoperatively.  Potential risks and complications were reviewed preoperatively including, but not limited to, infection, uterine perforation, organ damage, bleeding requiring transfusion of blood products with HIV and hepatitis acquisition, DVT, PE, pneumonia, laparotomy, laparoscopy, persistent or recurrent abnormal bleeding and pelvic pain.  All questions were answered.  The patient states she understands and agrees and consent was signed on the chart.  FINDINGS:  Both fallopian tube and ostia are noted.  There is a flat polypoid like formations on the anterior and posterior walls of the endometrial cavity.  DESCRIPTION OF PROCEDURE:  The patient was taken to the operating room, where she was placed in a dorsal supine position and general anesthesia was induced via LMA.  She was placed in a dorsal lithotomy position. She was prepped, bladder straight catheterized, and draped in sterile fashion.   Time-out undertaken.  She does receive IV antibiotics and sequential compression hose are turned on.  Bivalve speculum was placed in the vagina.  Anterior cervical lip was injected with 1% plain lidocaine and grasped with single-tooth tenaculum.  Paracervical block was placed at 2, 4, 5, 7, 8, and 10 o'clock positions with approximately 20 mL of the same solution.  Cervix was gently progressively dilated, but basically requires no dilation.  The hysteroscope was placed in the endocervical canal and advanced under direct visualization using distending media.  The above findings were noted.  The MyoSure was then placed and the flat polypoid type appearing areas were resected. Hysteroscope was then withdrawn and sharp curettage was carried out. Reinspection with the hysteroscope revealed the cavity to be clean. Hemostasis was noted.  Instruments were removed.  All counts were correct.  The patient was awaked and taken to recovery room in stable condition.     Daleen Bo Gaetano Net, M.D.     JET/MEDQ  D:  12/06/2016  T:  12/06/2016  Job:  [id number]

## 2016-12-25 ENCOUNTER — Telehealth: Payer: Self-pay | Admitting: *Deleted

## 2016-12-25 NOTE — Telephone Encounter (Signed)
Niferex ordered 11/03/16 but "Print" not electronically sent to the pharmacy. Called and verbal order given to [facility] per pt's request.

## 2016-12-25 NOTE — Telephone Encounter (Signed)
Noted Thanks DrG 

## 2016-12-29 ENCOUNTER — Other Ambulatory Visit: Payer: Self-pay | Admitting: Obstetrics and Gynecology

## 2016-12-29 DIAGNOSIS — R928 Other abnormal and inconclusive findings on diagnostic imaging of breast: Secondary | ICD-10-CM

## 2017-01-02 ENCOUNTER — Ambulatory Visit
Admission: RE | Admit: 2017-01-02 | Discharge: 2017-01-02 | Disposition: A | Discharge status: Home / Self Care | Payer: Commercial Managed Care - PPO | Source: Ambulatory Visit | Attending: Obstetrics and Gynecology | Admitting: Obstetrics and Gynecology

## 2017-01-02 DIAGNOSIS — R928 Other abnormal and inconclusive findings on diagnostic imaging of breast: Secondary | ICD-10-CM

## 2017-02-16 ENCOUNTER — Other Ambulatory Visit (INDEPENDENT_AMBULATORY_CARE_PROVIDER_SITE_OTHER): Payer: Commercial Managed Care - PPO

## 2017-02-16 DIAGNOSIS — C8307 Small cell B-cell lymphoma, spleen: Secondary | ICD-10-CM

## 2017-02-16 DIAGNOSIS — R768 Other specified abnormal immunological findings in serum: Secondary | ICD-10-CM

## 2017-02-16 DIAGNOSIS — D508 Other iron deficiency anemias: Secondary | ICD-10-CM

## 2017-02-16 DIAGNOSIS — D500 Iron deficiency anemia secondary to blood loss (chronic): Secondary | ICD-10-CM | POA: Diagnosis not present

## 2017-02-23 ENCOUNTER — Telehealth: Payer: Self-pay | Admitting: *Deleted

## 2017-02-23 LAB — CBC WITH DIFFERENTIAL/PLATELET
BASOS ABS: 0 10*3/uL (ref 0.0–0.2)
Basos: 1 %
EOS (ABSOLUTE): 0.1 10*3/uL (ref 0.0–0.4)
Eos: 3 %
HEMOGLOBIN: 11.5 g/dL (ref 11.1–15.9)
Hematocrit: 35.6 % (ref 34.0–46.6)
IMMATURE GRANS (ABS): 0 10*3/uL (ref 0.0–0.1)
Immature Granulocytes: 0 %
LYMPHS: 19 %
Lymphocytes Absolute: 0.8 10*3/uL (ref 0.7–3.1)
MCH: 26.1 pg — ABNORMAL LOW (ref 26.6–33.0)
MCHC: 32.3 g/dL (ref 31.5–35.7)
MCV: 81 fL (ref 79–97)
MONOCYTES: 5 %
Monocytes Absolute: 0.2 10*3/uL (ref 0.1–0.9)
Neutrophils Absolute: 2.8 10*3/uL (ref 1.4–7.0)
Neutrophils: 72 %
Platelets: 161 10*3/uL (ref 150–379)
RBC: 4.4 x10E6/uL (ref 3.77–5.28)
RDW: 16.7 % — ABNORMAL HIGH (ref 12.3–15.4)
WBC: 3.9 10*3/uL (ref 3.4–10.8)

## 2017-02-23 LAB — COMPREHENSIVE METABOLIC PANEL
A/G RATIO: 2.2 (ref 1.2–2.2)
ALT: 26 IU/L (ref 0–32)
AST: 21 IU/L (ref 0–40)
Albumin: 4.2 g/dL (ref 3.5–5.5)
Alkaline Phosphatase: 73 IU/L (ref 39–117)
BILIRUBIN TOTAL: 0.2 mg/dL (ref 0.0–1.2)
BUN/Creatinine Ratio: 12 (ref 9–23)
BUN: 10 mg/dL (ref 6–24)
CO2: 24 mmol/L (ref 18–29)
CREATININE: 0.82 mg/dL (ref 0.57–1.00)
Calcium: 8.5 mg/dL — ABNORMAL LOW (ref 8.7–10.2)
Chloride: 101 mmol/L (ref 96–106)
GFR, EST AFRICAN AMERICAN: 100 mL/min/{1.73_m2} (ref >59)
GFR, EST NON AFRICAN AMERICAN: 87 mL/min/{1.73_m2} (ref >59)
Globulin, Total: 1.9 g/dL (ref 1.5–4.5)
Glucose: 89 mg/dL (ref 65–99)
POTASSIUM: 4.4 mmol/L (ref 3.5–5.2)
SODIUM: 138 mmol/L (ref 134–144)

## 2017-02-23 LAB — IMMUNOFIXATION ELECTROPHORESIS
IGG (IMMUNOGLOBIN G), SERUM: 679 mg/dL — AB (ref 700–1600)
IgA/Immunoglobulin A, Serum: 191 mg/dL (ref 87–352)
IgM (Immunoglobulin M), Srm: 48 mg/dL (ref 26–217)
TOTAL PROTEIN: 6.1 g/dL (ref 6.0–8.5)

## 2017-02-23 LAB — LACTATE DEHYDROGENASE: LDH: 155 IU/L (ref 119–226)

## 2017-02-23 LAB — FERRITIN: FERRITIN: 10 ng/mL — AB (ref 15–150)

## 2017-02-23 NOTE — Telephone Encounter (Signed)
Call from pt - requesting lab results from last week. Thanks

## 2017-02-24 NOTE — Telephone Encounter (Signed)
Pt called / informed "lab good. Hb 11.5 up from 9.8. IgM remains low at 48 range 26-217. Ferritin low so she still needs to take iron.' per Dr Beryle Beams. Stated she had an ablation done and has not had a period for 2 months; prior to the ablation she had a constant period x 2 months. And she feels better;more energy since the ablation. Told her I will let Dr Darnell Level know. And wants to know when she come back for labs and office visit.

## 2017-02-24 NOTE — Telephone Encounter (Signed)
-----   Message from Annia Belt, MD sent at 02/24/2017  9:08 AM EDT ----- Call pt: lab good. Hb 11.5 up from 9.8. IgM remains low at 48 range 26-217. Ferritin low so she still needs to take iron.

## 2017-02-24 NOTE — Telephone Encounter (Signed)
Doris will call her w appt in early June

## 2017-05-11 ENCOUNTER — Ambulatory Visit (INDEPENDENT_AMBULATORY_CARE_PROVIDER_SITE_OTHER): Payer: Commercial Managed Care - PPO | Admitting: Oncology

## 2017-05-11 ENCOUNTER — Encounter: Payer: Self-pay | Admitting: Oncology

## 2017-05-11 ENCOUNTER — Encounter (INDEPENDENT_AMBULATORY_CARE_PROVIDER_SITE_OTHER): Payer: Self-pay

## 2017-05-11 VITALS — BP 143/79 | HR 70 | Temp 98.2°F | Ht 67.0 in | Wt 202.4 lb

## 2017-05-11 DIAGNOSIS — R768 Other specified abnormal immunological findings in serum: Secondary | ICD-10-CM | POA: Diagnosis not present

## 2017-05-11 DIAGNOSIS — C8590 Non-Hodgkin lymphoma, unspecified, unspecified site: Secondary | ICD-10-CM | POA: Diagnosis not present

## 2017-05-11 DIAGNOSIS — C8307 Small cell B-cell lymphoma, spleen: Secondary | ICD-10-CM

## 2017-05-11 DIAGNOSIS — D472 Monoclonal gammopathy: Secondary | ICD-10-CM

## 2017-05-11 NOTE — Patient Instructions (Addendum)
To lab today Schedule abdominal ultrasound with [facility] per pt convenience in next 1-2 weeks MD visit 6 months lab 1 week before visit

## 2017-05-11 NOTE — Progress Notes (Signed)
Hematology and Oncology Follow Up Visit  Kara Perkins [id number] [birth year] 46 y.o. 05/11/2017 10:17 AM   Principle Diagnosis: Encounter Diagnoses  Name Primary?  . IgM monoclonal gammopathy of uncertain significance Yes  . Positive Coombs test   . Lymphoma, marginal zone, spleen (HCC)   Clinical summary: 46 year old woman I first saw when she was pregnant back in 2008 and was found to have a positive direct Coombs' test. She never demonstrated any signs of hemolysis. She was followed closely through the pregnancy and blood counts remained stable. Over the years, the positive Coombs test extinguished. As part of her initial evaluation, serum immunoglobulins were done. She had a mild elevation of IgM. No concomitant suppression of her other immunoglobulins. I had forgotten about this until she had some GI problems back in March of 2011 when she developed acute ulcerative esophagitis. As part of her GI evaluation immunoglobulin studies were done and she showed a persistent elevation of IgM. She was reevaluated. Initial value for IgM was 425 mg percent compared with 530 mg percent done by her gastroenterologist. IgM levels monitored more closely since that time. There was a slow but steady trend for the IgM levels to be going up. A single 24-hour total urine protein was elevated at 667 mg in a pattern of nonselective proteinuria on 12/30/2009 but this was not reproducible. Subsequent study done 07/17/2010 with total urine protein on 89 mg, on 02/06/2011 18 mg. There was a monoclonal IgM kappa paraprotein in the urine as well as serum. In addition to the slow rise in her IgM, there was also a trend for progressive anemia, intermittent leukopenia, and intermittent fall in her platelet count but no single platelet count has been less than 120,000. Interpreting her hemoglobin has been complicated by the fact that she is chronically iron deficient from menorrhagia.  CT scan of the abdomen and pelvis  done 01/02/2010 did not show any lymphadenopathy or splenomegaly at that time. Bone marrow aspiration and biopsy was done on 12/11/2011. There was no evidence for either Waldenstrm's or multiple myeloma on that specimen.  She remained asymptomatic. However, lab done in May, 2015 showed progressive leukopenia with total white count 3,100, 44% neutrophils, 8% lymphocytes, I percent monocytes, 3 eosinophils. Platelet count down from 141,000 and October 2014 to 120,000 . Hemoglobin has come up from 8.7 g to 9.8 g on oral iron replacement.  I detected splenomegaly 5 cm below left costal margin on my 02/21/2014 exam. This was confirmed on a CT scan which showed spleen measuring 23 cm. Repeat bone marrow aspiration and biopsy done 03/23/2014 showed a monoclonal population of B lymphocytes expressing CD20 negative for CD5 and CD20 5. No gross lymphoid aggregates or infiltrates. 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 I reviewed treatment options at length with the patient and her husband.I recommended splenectomy as my first choice as opposed to chemotherapy. However the patient elected to take a chemotherapy program. Treatment administered by Dr. Heath Lark. She completed a course of treatment with cladribine 0.1 mg per kilogram IV plus Rituxan 329m per meter squared both given weekly x6 weeks between July 16 and 06/01/2014 She had no clinical lymphadenopathy but a staging PET scan done on 04/19/2014 other than showing an enlarged, hypermetabolic, spleen 23 cm in maximum dimension, also showed activity in bilateral axillary lymph glands and in addition, normal sized but hypermetabolic lymph glands along the chest wall, in the upper abdomen, and pelvis. She tolerated treatments well. She developed transient  mild leukopenia with lowest white count 2200, and mild thrombocytopenia with lowest platelet count 110,000. White count recovered to 4000 with 80% neutrophils, and platelets 162,000  as of 07/31/2014. She did not get any infections. She did not require hospitalization. IgM paraprotein normalized from peak pretreatment value of 1130 mg percent to most recent value of 192 on 07/31/14 . PET scan showed complete metabolic response in all lymph node areas and the spleen, but persistent mild splenomegaly but reduction down from 23 cm to 15 cm. Spleen size has remained stable at 15 cm through most recent CT scan done on 04/23/2016 and ultrasound done 08/11/2016 and 11/18/2016.  Interim History:   Overall doing well. Energy level is good. She had a D&C back in February. This did not control her menorrhagia. She subsequently had a endometrial ablation procedure in March which did successfully stop her menstrual cycles. She's had no other interim medical problems. We did lab NA abdominal ultrasound back in May and everything was stable. IgM levels remained low normal. Spleen mildly enlarged but stable and significantly decreased since previous chemotherapy treatments. Her hemoglobin, platelets, and white blood count, have slowly come back close to normal values. She has had no interim infections. She is very active with her children in sports and had a number of activities out [place] in [place] and then [place] in [place].  Medications: reviewed  Allergies: No Known Allergies  Review of Systems: See interim history Remaining ROS negative:   Physical Exam: Blood pressure (!) 143/79, pulse 70, temperature 98.2 F (36.8 C), temperature source Oral, height '5\' 7"'  (1.702 m), weight 202 lb 6.4 oz (91.8 kg), SpO2 99 %. Wt Readings from Last 3 Encounters:  05/11/17 202 lb 6.4 oz (91.8 kg)  12/06/16 197 lb 1.3 oz (89.4 kg)  11/18/16 200 lb (90.7 kg)     General appearance:  Well-nourished Caucasian woman HENNT: Pharynx no erythema, exudate, mass, or ulcer. No thyromegaly or thyroid nodules Lymph nodes: No cervical, supraclavicular, or axillary lymphadenopathy Breasts: Lungs: Clear  to auscultation, resonant to percussion throughout Heart: Regular rhythm, no murmur, no gallop, no rub, no click, no edema Abdomen: Soft, nontender, normal bowel sounds, no mass, no organomegaly. I was unable to palpate her spleen today. Extremities: No edema, no calf tenderness Musculoskeletal: no joint deformities GU:  Vascular: Carotid pulses 2+, no bruits, distal pulses: Dorsalis pedis 1+ symmetric Neurologic: Alert, oriented, PERRLA,   cranial nerves grossly normal, motor strength 5 over 5, reflexes 1+ symmetric, upper body coordination normal, gait normal, Skin: No rash or ecchymosis  Lab Results: CBC W/Diff    Component Value Date/Time   WBC 3.9 02/16/2017 0838   WBC 3.5 (L) 12/06/2016 0854   RBC 4.40 02/16/2017 0838   RBC 3.53 (L) 12/06/2016 0854   HGB 11.5 02/16/2017 0838   HGB 11.3 (L) 01/30/2015 0923   HCT 35.6 02/16/2017 0838   HCT 34.9 01/30/2015 0923   PLT 161 02/16/2017 0838   MCV 81 02/16/2017 0838   MCV 81.7 01/30/2015 0923   MCH 26.1 (L) 02/16/2017 0838   MCH 27.8 12/06/2016 0854   MCHC 32.3 02/16/2017 0838   MCHC 34.0 12/06/2016 0854   RDW 16.7 (H) 02/16/2017 0838   RDW 15.7 (H) 01/30/2015 0923   LYMPHSABS 0.8 02/16/2017 0838   LYMPHSABS 0.3 (L) 01/30/2015 0923   MONOABS 0.4 05/07/2015 1330   MONOABS 0.2 01/30/2015 0923   EOSABS 0.1 02/16/2017 0838   BASOSABS 0.0 02/16/2017 0838   BASOSABS 0.0 01/30/2015 5726  Chemistry      Component Value Date/Time   NA 138 02/16/2017 0838   NA 139 01/30/2015 0924   K 4.4 02/16/2017 0838   K 4.8 01/30/2015 0924   CL 101 02/16/2017 0838   CO2 24 02/16/2017 0838   CO2 25 01/30/2015 0924   BUN 10 02/16/2017 0838   BUN 11.5 01/30/2015 0924   CREATININE 0.82 02/16/2017 0838   CREATININE 0.8 01/30/2015 0924      Component Value Date/Time   CALCIUM 8.5 (L) 02/16/2017 0838   CALCIUM 9.1 01/30/2015 0924   ALKPHOS 73 02/16/2017 0838   ALKPHOS 77 01/30/2015 0924   AST 21 02/16/2017 0838   AST 21 01/30/2015 0924    ALT 26 02/16/2017 0838   ALT 27 01/30/2015 0924   BILITOT 0.2 02/16/2017 0838   BILITOT 0.48 01/30/2015 0924       Radiological Studies: No results found.  Impression:  #1. Marginal Zone low-grade non-Hodgkin's lymphoma treated as outlined above. No evidence for progression now out almost 3 years from completion of cladribine chemotherapy and Rituxan immunotherapy in August 2015. We talked about recent advances in the field. There are 3 new drugs that have significant activity in low-grade non-Hodgkin's lymphoma including Ibrutinib, Idelalisib, and Venetoclax use alone, or in combination with Rituxan or other anti-CD20 monoclonal antibody. In addition, the activity of Revlimid plus Rituxan as an alternative to a chemotherapy regimen has also held up in direct comparison to standard chemotherapy([facility] June, 2018). Plan: Continued periodic follow-up. I will get another ultrasound at this time. Decreased lab frequency to every 6 months. Follow-up visit at that time.  #2. 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 Currently negative and no evidence for hemolysis.  #3. Elevated IgM paraprotein-paraneoplastic manifestation of #1. IgM remains in low normal range following treatment.  #4. Iron deficiency anemia secondary to menorrhagia. No response to oral iron replacement. We will arrange for parenteral iron. Now menses have stopped status post endometrial ablation procedure and hemoglobin approaching normal.  #5. History of ulcerative esophagitis. Currently asymptomatic.  #6. Persistent mild leukopenia:  Improved over time. Likely secondary to previous chemotherapy effect.  CC: Patient Care Team: Sofie Hartigan, MD as PCP - General (Family Medicine) Annia Belt, MD as Consulting Physician (Oncology) Heath Lark, MD as Consulting Physician (Hematology and Oncology)   Murriel Hopper, MD, [place]  Hematology-Oncology/Internal  Medicine     7/30/201810:17 AM

## 2017-05-12 LAB — CBC WITH DIFFERENTIAL/PLATELET
BASOS: 0 %
Basophils Absolute: 0 10*3/uL (ref 0.0–0.2)
EOS (ABSOLUTE): 0.1 10*3/uL (ref 0.0–0.4)
EOS: 3 %
HEMATOCRIT: 35.9 % (ref 34.0–46.6)
HEMOGLOBIN: 12.4 g/dL (ref 11.1–15.9)
IMMATURE GRANS (ABS): 0 10*3/uL (ref 0.0–0.1)
Immature Granulocytes: 0 %
LYMPHS ABS: 0.9 10*3/uL (ref 0.7–3.1)
LYMPHS: 23 %
MCH: 30.1 pg (ref 26.6–33.0)
MCHC: 34.5 g/dL (ref 31.5–35.7)
MCV: 87 fL (ref 79–97)
MONOCYTES: 5 %
Monocytes Absolute: 0.2 10*3/uL (ref 0.1–0.9)
NEUTROS ABS: 2.5 10*3/uL (ref 1.4–7.0)
Neutrophils: 69 %
Platelets: 137 10*3/uL — ABNORMAL LOW (ref 150–379)
RBC: 4.12 x10E6/uL (ref 3.77–5.28)
RDW: 17.2 % — ABNORMAL HIGH (ref 12.3–15.4)
WBC: 3.7 10*3/uL (ref 3.4–10.8)

## 2017-05-12 LAB — COMPREHENSIVE METABOLIC PANEL
ALBUMIN: 4 g/dL (ref 3.5–5.5)
ALK PHOS: 62 IU/L (ref 39–117)
ALT: 47 IU/L — ABNORMAL HIGH (ref 0–32)
AST: 26 IU/L (ref 0–40)
Albumin/Globulin Ratio: 2.1 (ref 1.2–2.2)
BILIRUBIN TOTAL: 0.6 mg/dL (ref 0.0–1.2)
BUN / CREAT RATIO: 19 (ref 9–23)
BUN: 20 mg/dL (ref 6–24)
CHLORIDE: 104 mmol/L (ref 96–106)
CO2: 22 mmol/L (ref 20–29)
Calcium: 8.6 mg/dL — ABNORMAL LOW (ref 8.7–10.2)
Creatinine, Ser: 1.07 mg/dL — ABNORMAL HIGH (ref 0.57–1.00)
GFR calc non Af Amer: 63 mL/min/{1.73_m2} (ref >59)
GFR, EST AFRICAN AMERICAN: 72 mL/min/{1.73_m2} (ref >59)
Globulin, Total: 1.9 g/dL (ref 1.5–4.5)
Glucose: 117 mg/dL — ABNORMAL HIGH (ref 65–99)
Potassium: 4.2 mmol/L (ref 3.5–5.2)
SODIUM: 139 mmol/L (ref 134–144)
TOTAL PROTEIN: 5.9 g/dL — AB (ref 6.0–8.5)

## 2017-05-12 LAB — IGG, IGA, IGM
IGA/IMMUNOGLOBULIN A, SERUM: 180 mg/dL (ref 87–352)
IGM (IMMUNOGLOBULIN M), SRM: 42 mg/dL (ref 26–217)
IgG (Immunoglobin G), Serum: 692 mg/dL — ABNORMAL LOW (ref 700–1600)

## 2017-05-12 LAB — LACTATE DEHYDROGENASE: LDH: 176 IU/L (ref 119–226)

## 2017-05-12 LAB — DIRECT ANTIGLOBULIN TEST (NOT AT ARMC): Coombs', Direct: NEGATIVE

## 2017-05-13 ENCOUNTER — Telehealth: Payer: Self-pay | Admitting: *Deleted

## 2017-05-13 NOTE — Telephone Encounter (Signed)
-----   Message from Annia Belt, MD sent at 05/12/2017  4:33 PM EDT ----- Call pt: platelet count down slightly at 137,000 which is still good. Everything else OK. IgM level remains low at 42 mg and Hb up to 12.4

## 2017-05-13 NOTE — Telephone Encounter (Signed)
Mailbox full unable to leave message.

## 2017-05-15 NOTE — Telephone Encounter (Signed)
Pt called / informed "platelet count down slightly at 137,000 which is still good. Everything else OK. IgM level remains low at 42 mg and Hb up to 12.4" per Dr Beryle Beams.

## 2017-07-13 ENCOUNTER — Ambulatory Visit (HOSPITAL_COMMUNITY)
Admission: RE | Admit: 2017-07-13 | Discharge: 2017-07-13 | Disposition: A | Discharge status: Home / Self Care | Payer: Commercial Managed Care - PPO | Source: Ambulatory Visit | Attending: Oncology | Admitting: Oncology

## 2017-07-13 ENCOUNTER — Telehealth: Payer: Self-pay | Admitting: *Deleted

## 2017-07-13 DIAGNOSIS — C8307 Small cell B-cell lymphoma, spleen: Secondary | ICD-10-CM | POA: Diagnosis present

## 2017-07-13 DIAGNOSIS — D472 Monoclonal gammopathy: Secondary | ICD-10-CM

## 2017-07-13 DIAGNOSIS — R768 Other specified abnormal immunological findings in serum: Secondary | ICD-10-CM | POA: Diagnosis present

## 2017-07-13 NOTE — Telephone Encounter (Signed)
Pt called / informed "spleen size decreased compared with prior studies" per Dr Beryle Beams. Pt stated "good news".

## 2017-07-13 NOTE — Telephone Encounter (Signed)
-----   Message from Annia Belt, MD sent at 07/13/2017  1:18 PM EDT ----- Call pt: spleen size decreased compared with prior studies

## 2017-07-15 ENCOUNTER — Ambulatory Visit (HOSPITAL_COMMUNITY): Payer: Commercial Managed Care - PPO

## 2018-01-01 ENCOUNTER — Telehealth: Payer: Self-pay | Admitting: *Deleted

## 2018-01-01 NOTE — Telephone Encounter (Signed)
Pt had called/ left message about her next appt with Dr Beryle Beams. Last OV was in July . Left message with Josephina Shih to call pt and schedule an appt with Dr Darnell Level and labs one week before. Also left message on pt's VM.

## 2018-02-01 ENCOUNTER — Encounter: Payer: Commercial Managed Care - PPO | Admitting: Oncology

## 2018-02-02 ENCOUNTER — Other Ambulatory Visit: Payer: Self-pay | Admitting: Oncology

## 2018-02-02 DIAGNOSIS — C8307 Small cell B-cell lymphoma, spleen: Secondary | ICD-10-CM

## 2018-02-02 DIAGNOSIS — R768 Other specified abnormal immunological findings in serum: Secondary | ICD-10-CM

## 2018-02-02 DIAGNOSIS — D472 Monoclonal gammopathy: Secondary | ICD-10-CM

## 2018-02-04 ENCOUNTER — Other Ambulatory Visit (INDEPENDENT_AMBULATORY_CARE_PROVIDER_SITE_OTHER): Payer: Commercial Managed Care - PPO

## 2018-02-04 DIAGNOSIS — C8307 Small cell B-cell lymphoma, spleen: Secondary | ICD-10-CM | POA: Diagnosis not present

## 2018-02-04 DIAGNOSIS — D472 Monoclonal gammopathy: Secondary | ICD-10-CM | POA: Diagnosis not present

## 2018-02-04 DIAGNOSIS — R768 Other specified abnormal immunological findings in serum: Secondary | ICD-10-CM

## 2018-02-07 ENCOUNTER — Other Ambulatory Visit: Payer: Self-pay | Admitting: Oncology

## 2018-02-08 LAB — COMPREHENSIVE METABOLIC PANEL
ALBUMIN: 4.2 g/dL (ref 3.5–5.5)
ALK PHOS: 56 IU/L (ref 39–117)
ALT: 72 IU/L — ABNORMAL HIGH (ref 0–32)
AST: 31 IU/L (ref 0–40)
Albumin/Globulin Ratio: 2.2 (ref 1.2–2.2)
BILIRUBIN TOTAL: 1.2 mg/dL (ref 0.0–1.2)
BUN / CREAT RATIO: 18 (ref 9–23)
BUN: 16 mg/dL (ref 6–24)
CHLORIDE: 104 mmol/L (ref 96–106)
CO2: 22 mmol/L (ref 20–29)
Calcium: 8.6 mg/dL — ABNORMAL LOW (ref 8.7–10.2)
Creatinine, Ser: 0.88 mg/dL (ref 0.57–1.00)
GFR calc Af Amer: 91 mL/min/{1.73_m2} (ref >59)
GFR calc non Af Amer: 79 mL/min/{1.73_m2} (ref >59)
Globulin, Total: 1.9 g/dL (ref 1.5–4.5)
Glucose: 91 mg/dL (ref 65–99)
Potassium: 4.1 mmol/L (ref 3.5–5.2)
Sodium: 139 mmol/L (ref 134–144)

## 2018-02-08 LAB — IMMUNOFIXATION ELECTROPHORESIS
IGA/IMMUNOGLOBULIN A, SERUM: 196 mg/dL (ref 87–352)
IGG (IMMUNOGLOBIN G), SERUM: 838 mg/dL (ref 700–1600)
IgM (Immunoglobulin M), Srm: 47 mg/dL (ref 26–217)
Total Protein: 6.1 g/dL (ref 6.0–8.5)

## 2018-02-08 LAB — CBC WITH DIFFERENTIAL/PLATELET
Basophils Absolute: 0 10*3/uL (ref 0.0–0.2)
Basos: 0 %
EOS (ABSOLUTE): 0.1 10*3/uL (ref 0.0–0.4)
Eos: 3 %
Hematocrit: 31.1 % — ABNORMAL LOW (ref 34.0–46.6)
Hemoglobin: 10.3 g/dL — ABNORMAL LOW (ref 11.1–15.9)
Immature Grans (Abs): 0 10*3/uL (ref 0.0–0.1)
Immature Granulocytes: 0 %
LYMPHS ABS: 0.7 10*3/uL (ref 0.7–3.1)
Lymphs: 23 %
MCH: 32.9 pg (ref 26.6–33.0)
MCHC: 33.1 g/dL (ref 31.5–35.7)
MCV: 99 fL — ABNORMAL HIGH (ref 79–97)
MONOS ABS: 0.3 10*3/uL (ref 0.1–0.9)
Monocytes: 9 %
Neutrophils Absolute: 1.9 10*3/uL (ref 1.4–7.0)
Neutrophils: 65 %
Platelets: 155 10*3/uL (ref 150–379)
RBC: 3.13 x10E6/uL — AB (ref 3.77–5.28)
RDW: 15.6 % — ABNORMAL HIGH (ref 12.3–15.4)
WBC: 3 10*3/uL — AB (ref 3.4–10.8)

## 2018-02-08 LAB — ANTI-COMPLEMENT+ANTI-IGG

## 2018-02-08 LAB — DIRECT ANTIGLOBULIN TEST (NOT AT ARMC): Coombs', Direct: POSITIVE — AB

## 2018-02-08 LAB — LACTATE DEHYDROGENASE: LDH: 235 IU/L — AB (ref 119–226)

## 2018-02-09 ENCOUNTER — Other Ambulatory Visit: Payer: Self-pay | Admitting: Oncology

## 2018-02-09 ENCOUNTER — Other Ambulatory Visit: Payer: Self-pay

## 2018-02-09 ENCOUNTER — Ambulatory Visit (INDEPENDENT_AMBULATORY_CARE_PROVIDER_SITE_OTHER): Payer: Commercial Managed Care - PPO | Admitting: Oncology

## 2018-02-09 ENCOUNTER — Encounter: Payer: Self-pay | Admitting: Oncology

## 2018-02-09 VITALS — BP 134/72 | HR 81 | Temp 98.0°F | Ht 67.0 in | Wt 208.2 lb

## 2018-02-09 DIAGNOSIS — Z8719 Personal history of other diseases of the digestive system: Secondary | ICD-10-CM | POA: Diagnosis not present

## 2018-02-09 DIAGNOSIS — D472 Monoclonal gammopathy: Secondary | ICD-10-CM

## 2018-02-09 DIAGNOSIS — C8307 Small cell B-cell lymphoma, spleen: Secondary | ICD-10-CM | POA: Diagnosis not present

## 2018-02-09 DIAGNOSIS — Z9221 Personal history of antineoplastic chemotherapy: Secondary | ICD-10-CM

## 2018-02-09 DIAGNOSIS — R768 Other specified abnormal immunological findings in serum: Secondary | ICD-10-CM

## 2018-02-09 DIAGNOSIS — R7689 Other specified abnormal immunological findings in serum: Secondary | ICD-10-CM

## 2018-02-09 DIAGNOSIS — Z8742 Personal history of other diseases of the female genital tract: Secondary | ICD-10-CM | POA: Diagnosis not present

## 2018-02-09 DIAGNOSIS — L608 Other nail disorders: Secondary | ICD-10-CM | POA: Diagnosis not present

## 2018-02-09 DIAGNOSIS — Z862 Personal history of diseases of the blood and blood-forming organs and certain disorders involving the immune mechanism: Secondary | ICD-10-CM

## 2018-02-09 DIAGNOSIS — K2950 Unspecified chronic gastritis without bleeding: Secondary | ICD-10-CM | POA: Diagnosis not present

## 2018-02-09 DIAGNOSIS — D591 Autoimmune hemolytic anemia, unspecified: Secondary | ICD-10-CM

## 2018-02-09 LAB — COMPREHENSIVE METABOLIC PANEL
ALBUMIN: 4 g/dL (ref 3.5–5.0)
ALK PHOS: 55 U/L (ref 38–126)
ALT: 68 U/L — AB (ref 14–54)
AST: 35 U/L (ref 15–41)
Anion gap: 5 (ref 5–15)
BUN: 10 mg/dL (ref 6–20)
CALCIUM: 8.8 mg/dL — AB (ref 8.9–10.3)
CHLORIDE: 105 mmol/L (ref 101–111)
CO2: 27 mmol/L (ref 22–32)
CREATININE: 0.87 mg/dL (ref 0.44–1.00)
GFR calc Af Amer: >60 mL/min (ref >60)
GFR calc non Af Amer: >60 mL/min (ref >60)
GLUCOSE: 77 mg/dL (ref 65–99)
Potassium: 4 mmol/L (ref 3.5–5.1)
Sodium: 137 mmol/L (ref 135–145)
Total Bilirubin: 1.3 mg/dL — ABNORMAL HIGH (ref 0.3–1.2)
Total Protein: 6.4 g/dL — ABNORMAL LOW (ref 6.5–8.1)

## 2018-02-09 LAB — CBC WITH DIFFERENTIAL/PLATELET
BASOS ABS: 0 10*3/uL (ref 0.0–0.1)
BASOS PCT: 0 %
EOS ABS: 0.1 10*3/uL (ref 0.0–0.7)
Eosinophils Relative: 3 %
HEMATOCRIT: 31.2 % — AB (ref 36.0–46.0)
HEMOGLOBIN: 10.5 g/dL — AB (ref 12.0–15.0)
Lymphocytes Relative: 27 %
Lymphs Abs: 0.9 10*3/uL (ref 0.7–4.0)
MCH: 31.9 pg (ref 26.0–34.0)
MCHC: 33.7 g/dL (ref 30.0–36.0)
MCV: 94.8 fL (ref 78.0–100.0)
MONO ABS: 0.1 10*3/uL (ref 0.1–1.0)
Monocytes Relative: 1 %
NEUTROS ABS: 2.4 10*3/uL (ref 1.7–7.7)
NEUTROS PCT: 69 %
Platelets: 148 10*3/uL — ABNORMAL LOW (ref 150–400)
RBC: 3.29 MIL/uL — ABNORMAL LOW (ref 3.87–5.11)
RDW: 14.4 % (ref 11.5–15.5)
WBC: 3.5 10*3/uL — ABNORMAL LOW (ref 4.0–10.5)

## 2018-02-09 LAB — RETICULOCYTES
RBC.: 3.29 MIL/uL — ABNORMAL LOW (ref 3.87–5.11)
Retic Count, Absolute: 246.8 10*3/uL — ABNORMAL HIGH (ref 19.0–186.0)
Retic Ct Pct: 7.5 % — ABNORMAL HIGH (ref 0.4–3.1)

## 2018-02-09 LAB — SAVE SMEAR

## 2018-02-09 MED ORDER — PANTOPRAZOLE SODIUM 20 MG PO TBEC: 20.0000 mg | DELAYED_RELEASE_TABLET | Freq: Every day | ORAL | 2 refills | Status: DC

## 2018-02-09 MED ORDER — FOLIC ACID 1 MG PO TABS: 1.0000 mg | ORAL_TABLET | Freq: Every day | ORAL | 3 refills | Status: AC

## 2018-02-09 MED ORDER — PREDNISONE 20 MG PO TABS: 60.0000 mg | ORAL_TABLET | Freq: Every day | ORAL | 2 refills | Status: DC

## 2018-02-09 NOTE — Patient Instructions (Addendum)
To lab today Return visit 4 months lab 1 week before visit

## 2018-02-09 NOTE — Progress Notes (Signed)
Hematology and Oncology Follow Up Visit  Kara Perkins [id number] 14-Aug-1971 47 y.o. 02/09/2018 2:46 PM   Principle Diagnosis: Encounter Diagnoses  Name Primary?  . 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 IgM monoclonal gammopathy of uncertain significance   . Positive Coombs test   Clinical summary: 47 year old woman I first saw when she was pregnant back in 2008 and was found to have a positive direct Coombs' test. She never demonstrated any signs of hemolysis. She was followed closely through the pregnancy and blood counts remained stable. Over the years, the positive Coombs test extinguished. As part of her initial evaluation, serum immunoglobulins were done. She had a mild elevation of IgM. No concomitant suppression of her other immunoglobulins. I had forgotten about this until she had some GI problems back in March of 2011 when she developed acute ulcerative esophagitis. As part of her GI evaluation immunoglobulin studies were done and she showed a persistent elevation of IgM. She was reevaluated. Initial value for IgM was 425 mg percent compared with 530 mg percent done by her gastroenterologist. IgM levels monitored more closely since that time. There was a slow but steady trend for the IgM levels to be going up. A single 24-hour total urine protein was elevated at 667 mg in a pattern of nonselective proteinuria on 12/30/2009 but this was not reproducible. Subsequent study done 07/17/2010 with total urine protein on 89 mg, on 02/06/2011 18 mg. There was a monoclonal IgM kappa paraprotein in the urine as well as serum. In addition to the slow rise in her IgM, there was also a trend for progressive anemia, intermittent leukopenia, and intermittent fall in her platelet count but no single platelet count has been less than 120,000. Interpreting her hemoglobin has been complicated by the fact that she is chronically iron deficient from menorrhagia.  CT scan of the abdomen and pelvis  done 01/02/2010 did not show any lymphadenopathy or splenomegaly at that time. Bone marrow aspiration and biopsy was done on 12/11/2011. There was no evidence for either Waldenstrm's or multiple myeloma on that specimen.  She remained asymptomatic. However, lab done in May, 2015 showed progressive leukopenia with total white count 3,100, 44% neutrophils, 8% lymphocytes, I percent monocytes, 3 eosinophils. Platelet count down from 141,000 and October 2014 to 120,000 . Hemoglobin had come up from 8.7 g to 9.8 g on oral iron replacement.  I detected splenomegaly 5 cm below left costal margin on my 02/21/2014 exam. This was confirmed on a CT scan which showed spleen measuring 23 cm. Repeat bone marrow aspiration and biopsy done 03/23/2014 showed a monoclonal population of B lymphocytes expressing CD20 negative for CD5 and CD20 5. No gross lymphoid aggregates or infiltrates. IgM up to 1,130 mg%. 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 I reviewed treatment options at length with the patient and her husband.I recommended splenectomy as my first choice as opposed to chemotherapy. However the patient elected to take a chemotherapy program. Treatment administered by Dr. Heath Lark. She completed a course of treatment with cladribine 0.1 mg per kilogram IV plus Rituxan 329m per meter squared both given weekly x6 weeks between July 16 and 06/01/2014 She had no clinical lymphadenopathy but a staging PET scan done on 04/19/2014 other than showing an enlarged, hypermetabolic, spleen 23 cm in maximum dimension, also showed activity in bilateral axillary lymph glands and in addition, normal sized but hypermetabolic lymph glands along the chest wall, in the upper abdomen, and pelvis. She tolerated  treatments well. She developed transient mild leukopenia with lowest white count 2200, and mild thrombocytopenia with lowest platelet count 110,000. White count recovered to 4000 with 80% neutrophils,  and platelets 162,000 as of 07/31/2014. She did not get any infections. She did not require hospitalization. IgM paraprotein normalized from peak pretreatment value of 1130 mg percent to most recent value of 192 on 07/31/14 . PET scan showed complete metabolic response in all lymph node areas and the spleen, but persistent mild splenomegaly but reduction down from 23 cm to 15 cm. Spleen size has remained stable at 15 cm through most recent CT scan done on 04/23/2016 and ultrasound done 08/11/2016,  11/18/2016, & 07/13/17 .    Interim History:  Overall doing well.  She is out almost 4 years from treatment for marginal zone low-grade non-Hodgkin's lymphoma and her IgM level remains in the low normal range.  Oddly enough, lab done in anticipation of today's visit suggests that she is starting to hemolyze.  Coombs test has reverted to positive.  Bilirubin borderline elevated at 1.2.  Hemoglobin down 2 g from most recent baseline in July 2018 (12.4) to current value of 10.3 on February 04, 2018.  Concomitant rise in her MCV likely reflecting reticulocytosis.  White count also lower than recent baseline at 3000 with a normal differential.  Platelet count normal at 155,000.  LDH mildly elevated at 235 normal up to 226. One thing in her clinical history of possible importance is that her whole family got strep pharyngitis and she contracted it as well around the first week of this month. I repeated lab again today.  Results available after she left the office and confirm my suspicion.  Persistent mild elevation of bilirubin at 1.3, reticulocyte count 7.5%.  Hemoglobin stable but low compared to baseline at 10.5.  Medications: reviewed  Allergies: No Known Allergies  Review of Systems: See interim history. She had an endometrial ablation last May and no longer has menorrhagia in fact, she is now entered into the menopause. She has had a flareup of gastritis symptoms which she relates to drinking carbonated  beverages.  She describes an intense burning epigastric pain.  Nausea but no vomiting.  Symptoms have resolved taking an alternative medicine supplement and discontinuing carbonated beverages. She is concerned with some changes in her fingernails with irregularity and ridging which she did not notice at the time she was receiving chemotherapy. Remaining ROS negative:   Physical Exam: Blood pressure 134/72, pulse 81, temperature 98 F (36.7 C), temperature source Oral, height '5\' 7"'  (1.702 m), weight 208 lb 3.2 oz (94.4 kg), SpO2 100 %. Wt Readings from Last 3 Encounters:  02/09/18 208 lb 3.2 oz (94.4 kg)  05/11/17 202 lb 6.4 oz (91.8 kg)  12/06/16 197 lb 1.3 oz (89.4 kg)     General appearance: Well-nourished Caucasian woman HENNT: Pharynx no erythema, exudate, mass, or ulcer. No thyromegaly or thyroid nodules Lymph nodes: No cervical, supraclavicular, or axillary lymphadenopathy Breasts:  Lungs: Clear to auscultation, resonant to percussion throughout Heart: Regular rhythm, no murmur, no gallop, no rub, no click, no edema Abdomen: Soft, nontender, normal bowel sounds, no mass, no organomegaly Extremities: No edema, no calf tenderness Musculoskeletal: no joint deformities GU:  Vascular: Carotid pulses 2+, no bruits, Neurologic: Alert, oriented, PERRLA,cranial nerves grossly normal, motor strength 5 over 5, reflexes 1+ symmetric, upper body coordination normal, gait normal, Skin: No rash or ecchymosis Nails: Fine irregularity.  No evidence for fungal infection.  Lab Results: CBC W/Diff  Component Value Date/Time   WBC 3.5 (L) 02/09/2018 1205   RBC 3.29 (L) 02/09/2018 1205   RBC 3.29 (L) 02/09/2018 1205   HGB 10.5 (L) 02/09/2018 1205   HGB 10.3 (L) 02/04/2018 0827   HGB 11.3 (L) 01/30/2015 0923   HCT 31.2 (L) 02/09/2018 1205   HCT 31.1 (L) 02/04/2018 0827   HCT 34.9 01/30/2015 0923   PLT 148 (L) 02/09/2018 1205   PLT 155 02/04/2018 0827   MCV 94.8 02/09/2018 1205   MCV 99  (H) 02/04/2018 0827   MCV 81.7 01/30/2015 0923   MCH 31.9 02/09/2018 1205   MCHC 33.7 02/09/2018 1205   RDW 14.4 02/09/2018 1205   RDW 15.6 (H) 02/04/2018 0827   RDW 15.7 (H) 01/30/2015 0923   LYMPHSABS 0.9 02/09/2018 1205   LYMPHSABS 0.7 02/04/2018 0827   LYMPHSABS 0.3 (L) 01/30/2015 0923   MONOABS 0.1 02/09/2018 1205   MONOABS 0.2 01/30/2015 0923   EOSABS 0.1 02/09/2018 1205   EOSABS 0.1 02/04/2018 0827   BASOSABS 0.0 02/09/2018 1205   BASOSABS 0.0 02/04/2018 0827   BASOSABS 0.0 01/30/2015 0923     Chemistry      Component Value Date/Time   NA 137 02/09/2018 1205   NA 139 02/04/2018 0827   NA 139 01/30/2015 0924   K 4.0 02/09/2018 1205   K 4.8 01/30/2015 0924   CL 105 02/09/2018 1205   CO2 27 02/09/2018 1205   CO2 25 01/30/2015 0924   BUN 10 02/09/2018 1205   BUN 16 02/04/2018 0827   BUN 11.5 01/30/2015 0924   CREATININE 0.87 02/09/2018 1205   CREATININE 0.8 01/30/2015 0924      Component Value Date/Time   CALCIUM 8.8 (L) 02/09/2018 1205   CALCIUM 9.1 01/30/2015 0924   ALKPHOS 55 02/09/2018 1205   ALKPHOS 77 01/30/2015 0924   AST 35 02/09/2018 1205   AST 21 01/30/2015 0924   ALT 68 (H) 02/09/2018 1205   ALT 27 01/30/2015 0924   BILITOT 1.3 (H) 02/09/2018 1205   BILITOT 1.2 02/04/2018 0827   BILITOT 0.48 01/30/2015 0924       Radiological Studies: No results found.  Impression:  1.  Splenic marginal zone lymphoma She remains in a clinical and laboratory remission now out almost 4 years from initiation of treatment in July 2015.  IgM which is a marker of her disease activity remains in the low normal range at 47 mg percent as of February 04, 2018.  See #2 below.  2.  Reversion to Coombs positivity This was a early paraneoplastic manifestation of her lymphoma.  She never had hemolysis in the past despite the positive Coombs test even when she was pregnant.  Although there is a possibility that this is a early manifestation of lymphoma recurrence, I think it is  equally likely that the recent Streptococcus infection is related.  Streptococcus infection has been associated with removal of sialic acid residues from red cell antigens exposing them to immune attack and complement lysis. This may be a self-limited process but I am going to contact the patient about taking a short course of prednisone.  Resume a PPI in view of her intermittent gastritis symptoms.  3.  Chronic intermittent gastritis with past history of ulcerative esophagitis.  CC: Patient Care Team: Sofie Hartigan, MD as PCP - General (Family Medicine) Annia Belt, MD as Consulting Physician (Oncology) Heath Lark, MD as Consulting Physician (Hematology and Oncology)   Murriel Hopper, MD, [place]  Hematology-Oncology/Internal Medicine  4/30/20192:46 PM

## 2018-02-11 LAB — UPEP/TP, 24-HR URINE
ALPHA 2 UR: 0 %
Albumin, U: 100 %
Alpha 1, Urine: 0 %
Beta, Urine: 0 %
Gamma Globulin, Urine: 0 %
PROTEIN 24H UR: 90 mg/(24.h) (ref 30–150)
PROTEIN UR: 7.5 mg/dL

## 2018-02-12 ENCOUNTER — Encounter: Payer: Self-pay | Admitting: Oncology

## 2018-02-15 ENCOUNTER — Telehealth: Payer: Self-pay | Admitting: *Deleted

## 2018-02-15 ENCOUNTER — Other Ambulatory Visit: Payer: Self-pay | Admitting: Oncology

## 2018-02-15 DIAGNOSIS — D591 Autoimmune hemolytic anemia, unspecified: Secondary | ICD-10-CM

## 2018-02-15 DIAGNOSIS — R768 Other specified abnormal immunological findings in serum: Secondary | ICD-10-CM

## 2018-02-15 DIAGNOSIS — C8307 Small cell B-cell lymphoma, spleen: Secondary | ICD-10-CM

## 2018-02-15 NOTE — Telephone Encounter (Signed)
Pt called / informed "urine normal - no abnormal protein " per Dr Beryle Beams. Stated "good" and will see [place] on Wed for labs.

## 2018-02-15 NOTE — Telephone Encounter (Signed)
-----   Message from Annia Belt, MD sent at 02/15/2018  7:19 AM EDT ----- Call pt: urine normal - no abnormal protein

## 2018-02-15 NOTE — Progress Notes (Signed)
cbc

## 2018-02-17 ENCOUNTER — Encounter: Payer: Self-pay | Admitting: Oncology

## 2018-02-17 ENCOUNTER — Other Ambulatory Visit (INDEPENDENT_AMBULATORY_CARE_PROVIDER_SITE_OTHER): Payer: Commercial Managed Care - PPO

## 2018-02-17 ENCOUNTER — Telehealth: Payer: Self-pay | Admitting: *Deleted

## 2018-02-17 ENCOUNTER — Other Ambulatory Visit: Payer: Self-pay | Admitting: Oncology

## 2018-02-17 DIAGNOSIS — D591 Autoimmune hemolytic anemia, unspecified: Secondary | ICD-10-CM

## 2018-02-17 DIAGNOSIS — R768 Other specified abnormal immunological findings in serum: Secondary | ICD-10-CM | POA: Diagnosis not present

## 2018-02-17 DIAGNOSIS — C8307 Small cell B-cell lymphoma, spleen: Secondary | ICD-10-CM | POA: Diagnosis not present

## 2018-02-17 LAB — CBC WITH DIFFERENTIAL/PLATELET
BASOS ABS: 0 10*3/uL (ref 0.0–0.1)
BASOS PCT: 0 %
EOS ABS: 0.1 10*3/uL (ref 0.0–0.7)
EOS PCT: 1 %
HEMATOCRIT: 33.2 % — AB (ref 36.0–46.0)
Hemoglobin: 11.2 g/dL — ABNORMAL LOW (ref 12.0–15.0)
Lymphocytes Relative: 29 %
Lymphs Abs: 1.6 10*3/uL (ref 0.7–4.0)
MCH: 31.7 pg (ref 26.0–34.0)
MCHC: 33.7 g/dL (ref 30.0–36.0)
MCV: 94.1 fL (ref 78.0–100.0)
MONO ABS: 0.5 10*3/uL (ref 0.1–1.0)
MONOS PCT: 8 %
Neutro Abs: 3.4 10*3/uL (ref 1.7–7.7)
Neutrophils Relative %: 62 %
PLATELETS: 153 10*3/uL (ref 150–400)
RBC: 3.53 MIL/uL — ABNORMAL LOW (ref 3.87–5.11)
RDW: 14.2 % (ref 11.5–15.5)
WBC: 5.6 10*3/uL (ref 4.0–10.5)

## 2018-02-17 LAB — RETICULOCYTES
RBC.: 3.53 MIL/uL — AB (ref 3.87–5.11)
Retic Count, Absolute: 261.2 10*3/uL — ABNORMAL HIGH (ref 19.0–186.0)
Retic Ct Pct: 7.4 % — ABNORMAL HIGH (ref 0.4–3.1)

## 2018-02-17 NOTE — Telephone Encounter (Signed)
-----   Message from Annia Belt, MD sent at 02/17/2018 10:16 AM EDT ----- Call pt: Hb already improving up from 10.5 to 11.2. Stay on prednisone 60 mg daily. Weekly cbc,retic count. Anticipate staying on the 60 mg daily for 3 weeks then start to taper off over next 3 weeks.

## 2018-02-17 NOTE — Telephone Encounter (Signed)
Dr Beryle Beams has already called pt.

## 2018-02-24 ENCOUNTER — Telehealth: Payer: Self-pay | Admitting: *Deleted

## 2018-02-24 ENCOUNTER — Other Ambulatory Visit (INDEPENDENT_AMBULATORY_CARE_PROVIDER_SITE_OTHER): Payer: Commercial Managed Care - PPO

## 2018-02-24 DIAGNOSIS — D591 Autoimmune hemolytic anemia, unspecified: Secondary | ICD-10-CM

## 2018-02-24 DIAGNOSIS — R768 Other specified abnormal immunological findings in serum: Secondary | ICD-10-CM | POA: Diagnosis not present

## 2018-02-24 LAB — CBC WITH DIFFERENTIAL/PLATELET
ABS IMMATURE GRANULOCYTES: 0.1 10*3/uL (ref 0.0–0.1)
BASOS ABS: 0 10*3/uL (ref 0.0–0.1)
Basophils Relative: 0 %
Eosinophils Absolute: 0.1 10*3/uL (ref 0.0–0.7)
Eosinophils Relative: 1 %
HCT: 36.1 % (ref 36.0–46.0)
HEMOGLOBIN: 12 g/dL (ref 12.0–15.0)
IMMATURE GRANULOCYTES: 2 %
LYMPHS ABS: 1.8 10*3/uL (ref 0.7–4.0)
LYMPHS PCT: 24 %
MCH: 31.7 pg (ref 26.0–34.0)
MCHC: 33.2 g/dL (ref 30.0–36.0)
MCV: 95.3 fL (ref 78.0–100.0)
Monocytes Absolute: 0.4 10*3/uL (ref 0.1–1.0)
Monocytes Relative: 6 %
NEUTROS ABS: 5 10*3/uL (ref 1.7–7.7)
Neutrophils Relative %: 67 %
Platelets: 141 10*3/uL — ABNORMAL LOW (ref 150–400)
RBC: 3.79 MIL/uL — ABNORMAL LOW (ref 3.87–5.11)
RDW: 13.5 % (ref 11.5–15.5)
WBC: 7.4 10*3/uL (ref 4.0–10.5)

## 2018-02-24 LAB — RETICULOCYTES
RBC.: 3.79 MIL/uL — AB (ref 3.87–5.11)
RETIC COUNT ABSOLUTE: 227.4 10*3/uL — AB (ref 19.0–186.0)
RETIC CT PCT: 6 % — AB (ref 0.4–3.1)

## 2018-02-24 NOTE — Telephone Encounter (Signed)
Pt called / informed "Hb up to 12. Can decrease prednisone to 40 mg daily. Check again next week " per Dr Beryle Beams. Stated she had already taken 60 mg today but will start tomorrow w/40 mg.  Informed WBC 74. RBC 3.79 Retic 6.0% per pt's request. She has an appt already scheduled for next week.

## 2018-02-24 NOTE — Telephone Encounter (Signed)
-----   Message from Annia Belt, MD sent at 02/24/2018 10:05 AM EDT ----- Call pt: Hb up to 12. Can decrease prednisone to 40 mg daily. Check again next week

## 2018-03-03 ENCOUNTER — Other Ambulatory Visit: Payer: Commercial Managed Care - PPO

## 2018-03-03 ENCOUNTER — Telehealth: Payer: Self-pay | Admitting: *Deleted

## 2018-03-03 ENCOUNTER — Encounter: Payer: Self-pay | Admitting: Oncology

## 2018-03-03 ENCOUNTER — Other Ambulatory Visit: Payer: Self-pay | Admitting: Oncology

## 2018-03-03 ENCOUNTER — Ambulatory Visit (INDEPENDENT_AMBULATORY_CARE_PROVIDER_SITE_OTHER): Payer: Commercial Managed Care - PPO | Admitting: Oncology

## 2018-03-03 VITALS — BP 155/85 | Temp 98.2°F | Wt 211.3 lb

## 2018-03-03 DIAGNOSIS — R768 Other specified abnormal immunological findings in serum: Secondary | ICD-10-CM

## 2018-03-03 DIAGNOSIS — J329 Chronic sinusitis, unspecified: Secondary | ICD-10-CM

## 2018-03-03 DIAGNOSIS — J40 Bronchitis, not specified as acute or chronic: Secondary | ICD-10-CM

## 2018-03-03 DIAGNOSIS — Z7952 Long term (current) use of systemic steroids: Secondary | ICD-10-CM

## 2018-03-03 DIAGNOSIS — D591 Autoimmune hemolytic anemia, unspecified: Secondary | ICD-10-CM

## 2018-03-03 DIAGNOSIS — J0100 Acute maxillary sinusitis, unspecified: Secondary | ICD-10-CM

## 2018-03-03 DIAGNOSIS — C8307 Small cell B-cell lymphoma, spleen: Secondary | ICD-10-CM

## 2018-03-03 DIAGNOSIS — Z8572 Personal history of non-Hodgkin lymphomas: Secondary | ICD-10-CM

## 2018-03-03 LAB — CBC WITH DIFFERENTIAL/PLATELET
Abs Immature Granulocytes: 0 10*3/uL (ref 0.0–0.1)
Basophils Absolute: 0 10*3/uL (ref 0.0–0.1)
Basophils Relative: 0 %
EOS PCT: 1 %
Eosinophils Absolute: 0.1 10*3/uL (ref 0.0–0.7)
HEMATOCRIT: 37.5 % (ref 36.0–46.0)
HEMOGLOBIN: 12.3 g/dL (ref 12.0–15.0)
IMMATURE GRANULOCYTES: 1 %
LYMPHS ABS: 1.3 10*3/uL (ref 0.7–4.0)
LYMPHS PCT: 15 %
MCH: 31.4 pg (ref 26.0–34.0)
MCHC: 32.8 g/dL (ref 30.0–36.0)
MCV: 95.7 fL (ref 78.0–100.0)
Monocytes Absolute: 0.5 10*3/uL (ref 0.1–1.0)
Monocytes Relative: 6 %
NEUTROS PCT: 77 %
Neutro Abs: 6.9 10*3/uL (ref 1.7–7.7)
Platelets: 142 10*3/uL — ABNORMAL LOW (ref 150–400)
RBC: 3.92 MIL/uL (ref 3.87–5.11)
RDW: 13.5 % (ref 11.5–15.5)
WBC: 8.9 10*3/uL (ref 4.0–10.5)

## 2018-03-03 LAB — RETICULOCYTES
RBC.: 3.92 MIL/uL (ref 3.87–5.11)
Retic Count, Absolute: 188.2 10*3/uL — ABNORMAL HIGH (ref 19.0–186.0)
Retic Ct Pct: 4.8 % — ABNORMAL HIGH (ref 0.4–3.1)

## 2018-03-03 MED ORDER — AZITHROMYCIN 250 MG PO TABS: ORAL_TABLET | ORAL | 0 refills | Status: AC

## 2018-03-03 NOTE — Telephone Encounter (Signed)
Pt called / informed "Hb back to baseline @ 12.3. I would like her to stay on the 40 mg of prednisone this week; if counts stable next week, I will taper to 20 mg." per Dr Beryle Beams. She voiced understanding. Appt already scheduled.

## 2018-03-03 NOTE — Progress Notes (Signed)
Patient in for routine labs.  Asked to be seen.  Recently started on a course of prednisone for recurrent Coombs positive hemolytic anemia.  She has contracted a bronchitis.  Cough productive minimally yellow flecked sputum.  Sinusitis. She is afebrile. 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  Plan: I am prescribing azithromycin 5-day course.  Decongestant and antitussive over-the-counter.  She will be back in the next week for repeat labs.

## 2018-03-03 NOTE — Telephone Encounter (Signed)
-----   Message from Annia Belt, MD sent at 03/03/2018  9:50 AM EDT ----- Call pt: Hb back to baseline @ 12.3. I would like her to stay on the 40 mg of prednisone this week; if counts stable next week, I will taper to 20 mg

## 2018-03-04 ENCOUNTER — Encounter: Payer: Self-pay | Admitting: Oncology

## 2018-03-04 DIAGNOSIS — J0100 Acute maxillary sinusitis, unspecified: Secondary | ICD-10-CM | POA: Insufficient documentation

## 2018-03-04 DIAGNOSIS — D591 Autoimmune hemolytic anemia, unspecified: Secondary | ICD-10-CM | POA: Insufficient documentation

## 2018-03-04 NOTE — Progress Notes (Signed)
Brief work in visit for bronchitis/sinusitis. Note recorded under documentation.

## 2018-03-10 ENCOUNTER — Other Ambulatory Visit (INDEPENDENT_AMBULATORY_CARE_PROVIDER_SITE_OTHER): Payer: Commercial Managed Care - PPO

## 2018-03-10 ENCOUNTER — Telehealth: Payer: Self-pay | Admitting: *Deleted

## 2018-03-10 DIAGNOSIS — D591 Autoimmune hemolytic anemia, unspecified: Secondary | ICD-10-CM

## 2018-03-10 DIAGNOSIS — R768 Other specified abnormal immunological findings in serum: Secondary | ICD-10-CM

## 2018-03-10 LAB — CBC WITH DIFFERENTIAL/PLATELET
Abs Immature Granulocytes: 0 10*3/uL (ref 0.0–0.1)
BASOS ABS: 0 10*3/uL (ref 0.0–0.1)
BASOS PCT: 0 %
EOS ABS: 0.1 10*3/uL (ref 0.0–0.7)
Eosinophils Relative: 2 %
HCT: 38.4 % (ref 36.0–46.0)
Hemoglobin: 12.9 g/dL (ref 12.0–15.0)
IMMATURE GRANULOCYTES: 1 %
Lymphocytes Relative: 27 %
Lymphs Abs: 1.5 10*3/uL (ref 0.7–4.0)
MCH: 31.5 pg (ref 26.0–34.0)
MCHC: 33.6 g/dL (ref 30.0–36.0)
MCV: 93.7 fL (ref 78.0–100.0)
MONOS PCT: 5 %
Monocytes Absolute: 0.3 10*3/uL (ref 0.1–1.0)
NEUTROS PCT: 65 %
Neutro Abs: 3.6 10*3/uL (ref 1.7–7.7)
PLATELETS: 145 10*3/uL — AB (ref 150–400)
RBC: 4.1 MIL/uL (ref 3.87–5.11)
RDW: 13.4 % (ref 11.5–15.5)
WBC: 5.4 10*3/uL (ref 4.0–10.5)

## 2018-03-10 LAB — RETICULOCYTES
RBC.: 4.1 MIL/uL (ref 3.87–5.11)
RETIC CT PCT: 4.3 % — AB (ref 0.4–3.1)
Retic Count, Absolute: 176.3 10*3/uL (ref 19.0–186.0)

## 2018-03-10 NOTE — Telephone Encounter (Signed)
-----   Message from Kara Belt, MD sent at 03/10/2018  2:53 PM EDT ----- Call pt: Hb up to 12.9. Decrease prednisone to 20 mg daily. Check CBC,retic in 2 weeks

## 2018-03-10 NOTE — Telephone Encounter (Signed)
Pt called / informed "Hb up to 12.9. Decrease prednisone to 20 mg daily. Check CBC,retic in 2 weeks " per Dr Beryle Beams. Pt repeated instruction/ voiced understanding. Lab appt already scheduled for 6/12.

## 2018-03-17 ENCOUNTER — Other Ambulatory Visit: Payer: Commercial Managed Care - PPO

## 2018-03-24 ENCOUNTER — Other Ambulatory Visit (INDEPENDENT_AMBULATORY_CARE_PROVIDER_SITE_OTHER): Payer: Commercial Managed Care - PPO

## 2018-03-24 DIAGNOSIS — R768 Other specified abnormal immunological findings in serum: Secondary | ICD-10-CM

## 2018-03-24 DIAGNOSIS — D591 Autoimmune hemolytic anemia, unspecified: Secondary | ICD-10-CM

## 2018-03-24 LAB — CBC WITH DIFFERENTIAL/PLATELET
ABS IMMATURE GRANULOCYTES: 0 10*3/uL (ref 0.0–0.1)
BASOS ABS: 0 10*3/uL (ref 0.0–0.1)
BASOS PCT: 1 %
EOS PCT: 2 %
Eosinophils Absolute: 0.1 10*3/uL (ref 0.0–0.7)
HCT: 38.7 % (ref 36.0–46.0)
Hemoglobin: 13 g/dL (ref 12.0–15.0)
Immature Granulocytes: 1 %
Lymphocytes Relative: 23 %
Lymphs Abs: 0.9 10*3/uL (ref 0.7–4.0)
MCH: 30.9 pg (ref 26.0–34.0)
MCHC: 33.6 g/dL (ref 30.0–36.0)
MCV: 91.9 fL (ref 78.0–100.0)
MONO ABS: 0.3 10*3/uL (ref 0.1–1.0)
Monocytes Relative: 8 %
NEUTROS ABS: 2.7 10*3/uL (ref 1.7–7.7)
Neutrophils Relative %: 67 %
PLATELETS: 125 10*3/uL — AB (ref 150–400)
RBC: 4.21 MIL/uL (ref 3.87–5.11)
RDW: 13.3 % (ref 11.5–15.5)
WBC: 4.1 10*3/uL (ref 4.0–10.5)

## 2018-03-24 LAB — RETICULOCYTES
RBC.: 4.21 MIL/uL (ref 3.87–5.11)
RETIC COUNT ABSOLUTE: 130.5 10*3/uL (ref 19.0–186.0)
Retic Ct Pct: 3.1 % (ref 0.4–3.1)

## 2018-03-25 ENCOUNTER — Encounter: Payer: Self-pay | Admitting: Oncology

## 2018-03-25 NOTE — Telephone Encounter (Signed)
Called pt- informed Dr Beryle Beams did not want her to stop Prednisone "If she did, since current labs look good, we can just follow - let's get a CBC, retic count in 2-3 weeks'. Lab appt scheduled 6/26 @ 0930 AM.

## 2018-03-31 ENCOUNTER — Other Ambulatory Visit: Payer: Commercial Managed Care - PPO

## 2018-04-07 ENCOUNTER — Other Ambulatory Visit: Payer: Self-pay | Admitting: Oncology

## 2018-04-07 ENCOUNTER — Telehealth: Payer: Self-pay | Admitting: *Deleted

## 2018-04-07 ENCOUNTER — Other Ambulatory Visit (INDEPENDENT_AMBULATORY_CARE_PROVIDER_SITE_OTHER): Payer: Commercial Managed Care - PPO

## 2018-04-07 ENCOUNTER — Encounter: Payer: Self-pay | Admitting: Oncology

## 2018-04-07 DIAGNOSIS — R768 Other specified abnormal immunological findings in serum: Secondary | ICD-10-CM

## 2018-04-07 DIAGNOSIS — D591 Autoimmune hemolytic anemia, unspecified: Secondary | ICD-10-CM

## 2018-04-07 LAB — CBC WITH DIFFERENTIAL/PLATELET
Abs Immature Granulocytes: 0 10*3/uL (ref 0.0–0.1)
BASOS PCT: 1 %
Basophils Absolute: 0 10*3/uL (ref 0.0–0.1)
EOS ABS: 0.1 10*3/uL (ref 0.0–0.7)
Eosinophils Relative: 2 %
HCT: 32.4 % — ABNORMAL LOW (ref 36.0–46.0)
Hemoglobin: 10.9 g/dL — ABNORMAL LOW (ref 12.0–15.0)
IMMATURE GRANULOCYTES: 0 %
LYMPHS ABS: 0.7 10*3/uL (ref 0.7–4.0)
Lymphocytes Relative: 24 %
MCH: 31.8 pg (ref 26.0–34.0)
MCHC: 33.6 g/dL (ref 30.0–36.0)
MCV: 94.5 fL (ref 78.0–100.0)
Monocytes Absolute: 0.2 10*3/uL (ref 0.1–1.0)
Monocytes Relative: 8 %
Neutro Abs: 1.8 10*3/uL (ref 1.7–7.7)
Neutrophils Relative %: 65 %
PLATELETS: 139 10*3/uL — AB (ref 150–400)
RBC: 3.43 MIL/uL — AB (ref 3.87–5.11)
RDW: 16.4 % — AB (ref 11.5–15.5)
WBC: 2.8 10*3/uL — AB (ref 4.0–10.5)

## 2018-04-07 LAB — RETICULOCYTES
RBC.: 3.43 MIL/uL — AB (ref 3.87–5.11)
RETIC COUNT ABSOLUTE: 315.6 10*3/uL — AB (ref 19.0–186.0)
Retic Ct Pct: 9.2 % — ABNORMAL HIGH (ref 0.4–3.1)

## 2018-04-07 MED ORDER — PREDNISONE 20 MG PO TABS: 60.0000 mg | ORAL_TABLET | Freq: Every day | ORAL | 2 refills | Status: DC

## 2018-04-07 NOTE — Telephone Encounter (Signed)
-----   Message from Annia Belt, MD sent at 04/07/2018 10:11 AM EDT ----- Call pt: Hb drifting down: from 13 to 10.9. Will need to go back on steroids. We will need to taper more slowly this time. Start out at 60 mg prednisone daily. Repeat CBC every 2 weeks. We will will advise when to decrease; hope to drop to 40 mg after 2 weeks.

## 2018-04-07 NOTE — Telephone Encounter (Signed)
Pt called / informed "Hb drifting down: from 13 to 10.9. Will need to go back on steroids. We will need to taper more slowly this time. Start out at 60 mg prednisone daily. Repeat CBC every 2 weeks. We will will advise when to decrease; hope to drop to 40 mg after 2 weeks. " per Dr Beryle Beams. Pt voiced understanding.  Stated she has started back taking iron tablets once she completed the steroids. And has felt better since taking the iron tabs and stopping the steroids. And she had noticed less energy when taking the steroids; she had to take a nap during the day.

## 2018-04-07 NOTE — Telephone Encounter (Signed)
Thanks. FYI: she is hemolyzing so iron from hemolyzed blood will stay in her system & she does not need to take oral iron

## 2018-04-08 ENCOUNTER — Encounter: Payer: Self-pay | Admitting: Oncology

## 2018-04-08 DIAGNOSIS — D500 Iron deficiency anemia secondary to blood loss (chronic): Secondary | ICD-10-CM

## 2018-04-08 MED ORDER — POLYSACCHARIDE IRON COMPLEX 150 MG PO CAPS: 150.0000 mg | ORAL_CAPSULE | Freq: Two times a day (BID) | ORAL | 11 refills | Status: DC

## 2018-04-20 ENCOUNTER — Other Ambulatory Visit: Payer: Commercial Managed Care - PPO

## 2018-04-21 ENCOUNTER — Other Ambulatory Visit (INDEPENDENT_AMBULATORY_CARE_PROVIDER_SITE_OTHER): Payer: Commercial Managed Care - PPO

## 2018-04-21 DIAGNOSIS — R768 Other specified abnormal immunological findings in serum: Secondary | ICD-10-CM

## 2018-04-21 DIAGNOSIS — D591 Autoimmune hemolytic anemia, unspecified: Secondary | ICD-10-CM

## 2018-04-21 LAB — CBC WITH DIFFERENTIAL/PLATELET
ABS IMMATURE GRANULOCYTES: 0.1 10*3/uL (ref 0.0–0.1)
BASOS ABS: 0 10*3/uL (ref 0.0–0.1)
Basophils Relative: 0 %
EOS PCT: 1 %
Eosinophils Absolute: 0 10*3/uL (ref 0.0–0.7)
HEMATOCRIT: 34.2 % — AB (ref 36.0–46.0)
HEMOGLOBIN: 11.3 g/dL — AB (ref 12.0–15.0)
Immature Granulocytes: 2 %
LYMPHS PCT: 23 %
Lymphs Abs: 1.3 10*3/uL (ref 0.7–4.0)
MCH: 31.9 pg (ref 26.0–34.0)
MCHC: 33 g/dL (ref 30.0–36.0)
MCV: 96.6 fL (ref 78.0–100.0)
MONO ABS: 0.4 10*3/uL (ref 0.1–1.0)
Monocytes Relative: 8 %
NEUTROS ABS: 4 10*3/uL (ref 1.7–7.7)
Neutrophils Relative %: 66 %
Platelets: 118 10*3/uL — ABNORMAL LOW (ref 150–400)
RBC: 3.54 MIL/uL — AB (ref 3.87–5.11)
RDW: 15.5 % (ref 11.5–15.5)
WBC: 5.9 10*3/uL (ref 4.0–10.5)

## 2018-04-21 LAB — RETICULOCYTES
RBC.: 3.54 MIL/uL — ABNORMAL LOW (ref 3.87–5.11)
Retic Count, Absolute: 265.5 10*3/uL — ABNORMAL HIGH (ref 19.0–186.0)
Retic Ct Pct: 7.5 % — ABNORMAL HIGH (ref 0.4–3.1)

## 2018-04-23 ENCOUNTER — Telehealth: Payer: Self-pay | Admitting: *Deleted

## 2018-04-23 ENCOUNTER — Other Ambulatory Visit: Payer: Self-pay | Admitting: Oncology

## 2018-04-23 DIAGNOSIS — C8307 Small cell B-cell lymphoma, spleen: Secondary | ICD-10-CM

## 2018-04-23 DIAGNOSIS — D591 Autoimmune hemolytic anemia, unspecified: Secondary | ICD-10-CM

## 2018-04-23 DIAGNOSIS — R768 Other specified abnormal immunological findings in serum: Secondary | ICD-10-CM

## 2018-04-23 DIAGNOSIS — D472 Monoclonal gammopathy: Secondary | ICD-10-CM

## 2018-04-23 NOTE — Telephone Encounter (Signed)
Called pt - no answer and mailbox is full, unable to leave message

## 2018-04-23 NOTE — Telephone Encounter (Signed)
Pt called /informed "Hb 11.3 - stable compared w 2 weeks ago but not back to her baseline and platelet count trending down. Stay on prednisone 40 mg daily. " per Dr Beryle Beams. Stated she had already seen results; and is taking 60 mg of Prednisone.  Called Dr Darnell Level - instructed to decrease Prednisone to 40 mg; relayed to pt. Appt has been schedule for 7/23 @ 0845 AM.

## 2018-04-23 NOTE — Telephone Encounter (Signed)
-----   Message from Annia Belt, MD sent at 04/23/2018  7:45 AM EDT ----- Call pt: Hb 11.3 - stable compared w 2 weeks ago but not back to her baseline and platelet count trending down. Stay on prednisone 40 mg daily. Let's get her in for a follow up visit on 7/22 or 7/23.Repeat lab day of visit. I will put in lab order.

## 2018-04-26 MED ORDER — PREDNISONE 20 MG PO TABS: 40.0000 mg | ORAL_TABLET | Freq: Every day | ORAL | 2 refills | Status: DC

## 2018-05-04 ENCOUNTER — Other Ambulatory Visit: Payer: Self-pay

## 2018-05-04 ENCOUNTER — Encounter: Payer: Self-pay | Admitting: Oncology

## 2018-05-04 ENCOUNTER — Ambulatory Visit (INDEPENDENT_AMBULATORY_CARE_PROVIDER_SITE_OTHER): Payer: Commercial Managed Care - PPO | Admitting: Oncology

## 2018-05-04 VITALS — BP 140/71 | HR 64 | Temp 98.0°F | Ht 67.0 in | Wt 217.3 lb

## 2018-05-04 DIAGNOSIS — R768 Other specified abnormal immunological findings in serum: Secondary | ICD-10-CM

## 2018-05-04 DIAGNOSIS — Z9221 Personal history of antineoplastic chemotherapy: Secondary | ICD-10-CM

## 2018-05-04 DIAGNOSIS — D591 Autoimmune hemolytic anemia, unspecified: Secondary | ICD-10-CM

## 2018-05-04 DIAGNOSIS — Z7952 Long term (current) use of systemic steroids: Secondary | ICD-10-CM

## 2018-05-04 DIAGNOSIS — C8307 Small cell B-cell lymphoma, spleen: Secondary | ICD-10-CM | POA: Diagnosis not present

## 2018-05-04 DIAGNOSIS — D696 Thrombocytopenia, unspecified: Secondary | ICD-10-CM

## 2018-05-04 DIAGNOSIS — D472 Monoclonal gammopathy: Secondary | ICD-10-CM

## 2018-05-04 DIAGNOSIS — R7689 Other specified abnormal immunological findings in serum: Secondary | ICD-10-CM

## 2018-05-04 DIAGNOSIS — E611 Iron deficiency: Secondary | ICD-10-CM

## 2018-05-04 DIAGNOSIS — N920 Excessive and frequent menstruation with regular cycle: Secondary | ICD-10-CM

## 2018-05-04 DIAGNOSIS — Z8719 Personal history of other diseases of the digestive system: Secondary | ICD-10-CM

## 2018-05-04 LAB — CBC WITH DIFFERENTIAL/PLATELET
Abs Immature Granulocytes: 0 10*3/uL (ref 0.0–0.1)
BASOS ABS: 0 10*3/uL (ref 0.0–0.1)
BASOS PCT: 0 %
EOS ABS: 0.1 10*3/uL (ref 0.0–0.7)
EOS PCT: 1 %
HEMATOCRIT: 38.9 % (ref 36.0–46.0)
Hemoglobin: 12.9 g/dL (ref 12.0–15.0)
Immature Granulocytes: 0 %
Lymphocytes Relative: 25 %
Lymphs Abs: 1.5 10*3/uL (ref 0.7–4.0)
MCH: 31 pg (ref 26.0–34.0)
MCHC: 33.2 g/dL (ref 30.0–36.0)
MCV: 93.5 fL (ref 78.0–100.0)
Monocytes Absolute: 0.4 10*3/uL (ref 0.1–1.0)
Monocytes Relative: 7 %
Neutro Abs: 4.1 10*3/uL (ref 1.7–7.7)
Neutrophils Relative %: 67 %
PLATELETS: 130 10*3/uL — AB (ref 150–400)
RBC: 4.16 MIL/uL (ref 3.87–5.11)
RDW: 13.8 % (ref 11.5–15.5)
WBC: 6.1 10*3/uL (ref 4.0–10.5)

## 2018-05-04 LAB — COMPREHENSIVE METABOLIC PANEL
ALBUMIN: 3.8 g/dL (ref 3.5–5.0)
ALT: 70 U/L — ABNORMAL HIGH (ref 0–44)
ANION GAP: 8 (ref 5–15)
AST: 18 U/L (ref 15–41)
Alkaline Phosphatase: 39 U/L (ref 38–126)
BILIRUBIN TOTAL: 0.9 mg/dL (ref 0.3–1.2)
BUN: 16 mg/dL (ref 6–20)
CO2: 30 mmol/L (ref 22–32)
Calcium: 8.9 mg/dL (ref 8.9–10.3)
Chloride: 103 mmol/L (ref 98–111)
Creatinine, Ser: 0.89 mg/dL (ref 0.44–1.00)
GFR calc Af Amer: >60 mL/min (ref >60)
GFR calc non Af Amer: >60 mL/min (ref >60)
GLUCOSE: 101 mg/dL — AB (ref 70–99)
POTASSIUM: 3.8 mmol/L (ref 3.5–5.1)
Sodium: 141 mmol/L (ref 135–145)
TOTAL PROTEIN: 5.9 g/dL — AB (ref 6.5–8.1)

## 2018-05-04 LAB — LACTATE DEHYDROGENASE: LDH: 171 U/L (ref 98–192)

## 2018-05-04 LAB — RETICULOCYTES
RBC.: 4.16 MIL/uL (ref 3.87–5.11)
RETIC COUNT ABSOLUTE: 133.1 10*3/uL (ref 19.0–186.0)
Retic Ct Pct: 3.2 % — ABNORMAL HIGH (ref 0.4–3.1)

## 2018-05-04 NOTE — Addendum Note (Signed)
Addended by: Truddie Crumble on: 05/04/2018 08:41 AM   Modules accepted: Orders

## 2018-05-04 NOTE — Patient Instructions (Addendum)
Obtain pre-authorization for restaging PET scan to be done at [facility] 1st available CBC, diff, retic count in 2 weeks & 4 weeks MD visit 8/19

## 2018-05-04 NOTE — Progress Notes (Signed)
Hematology and Oncology Follow Up Visit  Kara Perkins [id number] 01-02-1971 47 y.o. 05/04/2018 9:41 AM   Principle Diagnosis: Encounter Diagnoses  Name Primary?  . Positive Coombs test   . AIHA (autoimmune hemolytic anemia) (HCC) Yes  . IgM monoclonal gammopathy of uncertain significance   . Lymphoma, marginal zone, spleen [facility])   Updated clinical summary: 47 year old woman I first saw when she was pregnant back in 2008 and was found to have a positive direct Coombs' test. She never demonstrated any signs of hemolysis. She was followed closely through the pregnancy and blood counts remained stable. Over the years, the positive Coombs test extinguished. As part of her initial evaluation, serum immunoglobulins were done. She had a mild elevation of IgM. No concomitant suppression of her other immunoglobulins. I had forgotten about this until she had some GI problems back in March of 2011 when she developed acute ulcerative esophagitis. As part of her GI evaluation immunoglobulin studies were done and she showed a persistent elevation of IgM. She was reevaluated. Initial value for IgM was 425 mg percent compared with 530 mg percent done by her gastroenterologist. IgM levels monitored more closely since that time. There was a slow but steady trend for the IgM levels to be going up. A single 24-hour total urine protein was elevated at 667 mg in a pattern of nonselective proteinuria on 12/30/2009 but this was not reproducible. Subsequent study done 07/17/2010 with total urine protein on 89 mg, on 02/06/2011 18 mg. There was a monoclonal IgM kappa paraprotein in the urine as well as serum. In addition to the slow rise in her IgM, there was also a trend for progressive anemia, intermittent leukopenia, and intermittent fall in her platelet count but no single platelet count has been less than 120,000. Interpreting her hemoglobin has been complicated by the fact that she is chronically iron deficient  from menorrhagia.  CT scan of the abdomen and pelvis done 01/02/2010 did not show any lymphadenopathy or splenomegaly at that time. Bone marrow aspiration and biopsy was done on 12/11/2011. There was no evidence for either Waldenstrm's or multiple myeloma on that specimen.  She remained asymptomatic. However, lab done in May, 2015 showed progressive leukopenia with total white count 3,100, 44% neutrophils, 8% lymphocytes, I percent monocytes, 3 eosinophils. Platelet count down from 141,000 and October 2014 to 120,000 . Hemoglobin had come up from 8.7 g to 9.8 g on oral iron replacement.  I detected splenomegaly 5 cm below left costal margin on my 02/21/2014 exam. This was confirmed on a CT scan which showed spleen measuring 23 cm. Repeat bone marrow aspiration and biopsy done 03/23/2014 showed a monoclonal population of B lymphocytes expressing CD20 negative for CD5 and CD25. No gross lymphoid aggregates or infiltrates. IgM up to 1,130 mg%. 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 I reviewed treatment options at length with the patient and her husband.I recommended splenectomy as my first choice as opposed to chemotherapy. However the patient elected to take a chemotherapy program. Treatment administered by Dr. Heath Lark. She completed a course of treatment with cladribine 0.1 mg per kilogram IV plus Rituxan 31m per meter squared both given weekly x6 weeks between July 16 and 06/01/2014 She had no clinical lymphadenopathy but a staging PET scan done on 04/19/2014 other than showing an enlarged, hypermetabolic, spleen 23 cm in maximum dimension, also showed activity in bilateral axillary lymph glands and in addition, normal sized but hypermetabolic lymph glands along the chest wall,  in the upper abdomen, and pelvis. She tolerated treatments well. She developed transient mild leukopenia with lowest white count 2200, and mild thrombocytopenia with lowest platelet count 110,000.  White count recovered to 4000 with 80% neutrophils, and platelets 162,000 as of 07/31/2014. She did not get any infections. She did not require hospitalization. IgM paraprotein normalized from peak pretreatment value of 1130 mg percent to most recent value of 192 on 07/31/14 . PET scan showed complete metabolic response in all lymph node areas and the spleen, but persistent mild splenomegaly but reduction down from 23 cm to 15 cm. Spleen size has remained stable at 15 cm through most recent CT scan done on 04/23/2016 and ultrasound done 08/11/2016,  11/18/2016, & 07/13/17 .  At time of lab analysis done February 04, 2018, there was a fall in hemoglobin from 12.4 g in July 2018 to 10.3 g.  I repeated a Coombs test and reticulocyte count.  Coombs test again positive and retake count 7.5%.  LDH borderline elevated at 235 lab normal up to 226.  IgM which was a marker of her lymphoma activity remains low normal at 47 mg percent.  No obvious adenopathy or organomegaly on exam.  She was started on prednisone 60 mg daily.  She had a prompt rise in her hemoglobin up to 13 g and fall in her reticulocyte count to 3%.  However when steroids were tapered down to 20 mg of prednisone daily, hemoglobin fell back to 11 g and reticulocyte count rose to 7.5%.  Steroids reinitiated at 60 mg.  Over the same interval her platelet count slowly declined to 118,000 by April 21, 2018.   Interim History: As noted above, since her last visit with me in April, she has developed Coombs positive autoimmune hemolytic anemia.  As noted above, she had a long prodrome for many years with an unexplained positive Coombs test without evidence of hemolysis prior to the diagnosis of her lymphoma which was subsequently heralded by slowly progressive pancytopenia and rising IgM paraprotein then enlarging splenomegaly. I had to put her on prednisone.  She is barely tolerating the drug.  15 pound weight gain in just 2 months.  Emotional lability.   Cushingoid on exam today.  No other new symptoms.  I did treat her for an acute bronchitis with antibiotics on May 22.    She continues to work full-time.  Her 2 daughters now in the fifth and 10th grade are doing well.  Medications: reviewed: Currently on prednisone 40 mg daily following an additional 2 weeks at dose escalation to 60 mg.  Allergies: No Known Allergies  Review of Systems: See interim history Remaining ROS negative:   Physical Exam: Blood pressure 140/71, pulse 64, temperature 98 F (36.7 C), temperature source Oral, height 5' 7" (1.702 m), weight 217 lb 4.8 oz (98.6 kg), SpO2 100 %. Wt Readings from Last 3 Encounters:  05/04/18 217 lb 4.8 oz (98.6 kg)  03/03/18 211 lb 4.8 oz (95.8 kg)  02/09/18 208 lb 3.2 oz (94.4 kg)     General appearance: Cushingoid Caucasian woman HENNT: Pharynx no erythema, exudate, mass, or ulcer. No thyromegaly or thyroid nodules Lymph nodes: No cervical, supraclavicular, or axillary lymphadenopathy Breasts:  Lungs: Clear to auscultation, resonant to percussion throughout Heart: Regular rhythm, no murmur, no gallop, no rub, no click, no edema Abdomen: Soft, nontender, normal bowel sounds, no mass, no organomegaly Extremities: No edema, no calf tenderness Musculoskeletal: no joint deformities GU:  Vascular: Carotid pulses 2+, no bruits,  Neurologic: Alert, oriented, PERRLA,cranial nerves grossly normal, motor strength 5 over 5, reflexes 1+ symmetric, upper body coordination normal, gait normal, Skin: No rash or ecchymosis  Lab Results: CBC W/Diff    Component Value Date/Time   WBC 6.1 05/04/2018 0844   RBC 4.16 05/04/2018 0844   RBC 4.16 05/04/2018 0844   HGB 12.9 05/04/2018 0844   HGB 10.3 (L) 02/04/2018 0827   HGB 11.3 (L) 01/30/2015 0923   HCT 38.9 05/04/2018 0844   HCT 31.1 (L) 02/04/2018 0827   HCT 34.9 01/30/2015 0923   PLT 130 (L) 05/04/2018 0844   PLT 155 02/04/2018 0827   MCV 93.5 05/04/2018 0844   MCV 99 (H)  02/04/2018 0827   MCV 81.7 01/30/2015 0923   MCH 31.0 05/04/2018 0844   MCHC 33.2 05/04/2018 0844   RDW 13.8 05/04/2018 0844   RDW 15.6 (H) 02/04/2018 0827   RDW 15.7 (H) 01/30/2015 0923   LYMPHSABS 1.5 05/04/2018 0844   LYMPHSABS 0.7 02/04/2018 0827   LYMPHSABS 0.3 (L) 01/30/2015 0923   MONOABS 0.4 05/04/2018 0844   MONOABS 0.2 01/30/2015 0923   EOSABS 0.1 05/04/2018 0844   EOSABS 0.1 02/04/2018 0827   BASOSABS 0.0 05/04/2018 0844   BASOSABS 0.0 02/04/2018 0827   BASOSABS 0.0 01/30/2015 0923     Chemistry      Component Value Date/Time   NA 141 05/04/2018 0844   NA 139 02/04/2018 0827   NA 139 01/30/2015 0924   K 3.8 05/04/2018 0844   K 4.8 01/30/2015 0924   CL 103 05/04/2018 0844   CO2 30 05/04/2018 0844   CO2 25 01/30/2015 0924   BUN 16 05/04/2018 0844   BUN 16 02/04/2018 0827   BUN 11.5 01/30/2015 0924   CREATININE 0.89 05/04/2018 0844   CREATININE 0.8 01/30/2015 0924      Component Value Date/Time   CALCIUM 8.9 05/04/2018 0844   CALCIUM 9.1 01/30/2015 0924   ALKPHOS 39 05/04/2018 0844   ALKPHOS 77 01/30/2015 0924   AST 18 05/04/2018 0844   AST 21 01/30/2015 0924   ALT 70 (H) 05/04/2018 0844   ALT 27 01/30/2015 0924   BILITOT 0.9 05/04/2018 0844   BILITOT 1.2 02/04/2018 0827   BILITOT 0.48 01/30/2015 0924    Hemoglobin up to 12.9, retake count down to 3.2%, platelet count up from 118,000 to 130,000.   Radiological Studies: No results found.  Impression:  1.  Marginal zone lymphoma now out 4 years from treatment.  She obtained a complete radiographic on PET scan and chemical response with normalization of IgM paraprotein. 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 I am concerned that this may represent an early relapse of her lymphoma especially in view of transient leukopenia and thrombocytopenia both of which occurred at time of her initial diagnosis.  It  is possible that we are seeing partial treatment of relapsed lymphoma in response to steroids.  We discussed this at length today.  I am repeating IgM immunoglobulin studies today and I will schedule her for a restaging PET scan. A number of advances have been made in the field with respect to treatment of low-grade non-Hodgkin's lymphoma including marginal zone lymphoma and two new regimens of interest have been FDA approved: A combination of Revlimid 20 mg daily day 1 through 21 on a 28-day cycle plus Rituxan showing overall responses of 80 to 90% with up to 50% complete responses in untreated indolent  lymphoma and with equivalent efficacy when compared directly with a previous standard regimen of Cytoxan Adriamycin vincristine and prednisone. An additional regimen of note is single agent  ibrutinib 540 mg daily.  Overall response in relapsed marginal zone lymphoma 48%.  Median duration of response not reached at 19.4 months.  It is not clear at this point how much Rituxan adds to ibrutinib in this setting based on data that was presented at our oncology meetings in May 2018.  In any case, we now have 2 potential highly active regimens that we could offer if she has in fact relapsed.  2.  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 Responding to steroids.  See discussion above.  3.  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  4.  Prior history of iron deficiency anemia secondary to menorrhagia.  Unresponsive to oral iron and requiring parenteral iron.  5.  History of ulcerative esophagitis.  Total time with direct face-to-face contact with the patient for exam, review of data, review of previous imaging studies, and discussion of a strategy in the event her lymphoma has relapsed: 50 minutes.  CC: Patient Care Team: Sofie Hartigan, MD as PCP - General (Family Medicine) Annia Belt, MD as Consulting Physician (Oncology) Heath Lark, MD as  Consulting Physician (Hematology and Oncology)   Murriel Hopper, MD, [place]  Hematology-Oncology/Internal Medicine     7/23/20199:41 AM

## 2018-05-05 ENCOUNTER — Telehealth: Payer: Self-pay | Admitting: *Deleted

## 2018-05-05 NOTE — Telephone Encounter (Signed)
-----   Message from Annia Belt, MD sent at 05/04/2018  4:18 PM EDT ----- Please call pt: Hb up to 12.9, platelets up to 130,000. OK to decrease prednisone to 20 mg daily & hold there until further notice.

## 2018-05-05 NOTE — Telephone Encounter (Signed)
Pt called / informed " Hb up to 12.9, platelets up to 130,000(previously 118,000). OK to decrease prednisone to 20 mg daily & hold there until further notice" per Dr Beryle Beams. Voiced understanding. Lab appt scheduled 8/7 and will see Dr Darnell Level on 8/19 and get labs same day (which will be in 4 weeks).

## 2018-05-06 LAB — IMMUNOFIXATION ELECTROPHORESIS
IGA: 111 mg/dL (ref 87–352)
IGG (IMMUNOGLOBIN G), SERUM: 464 mg/dL — AB (ref 700–1600)
IgM (Immunoglobulin M), Srm: 157 mg/dL (ref 26–217)
Total Protein ELP: 5.7 g/dL — ABNORMAL LOW (ref 6.0–8.5)

## 2018-05-07 ENCOUNTER — Telehealth: Payer: Self-pay | Admitting: *Deleted

## 2018-05-07 NOTE — Telephone Encounter (Signed)
Pt called / informed "IgM level, although higher than last time, still well within normal range (@157 ). " per Dr Beryle Beams. Informed nl range is 26-217.

## 2018-05-07 NOTE — Telephone Encounter (Signed)
-----   Message from Annia Belt, MD sent at 05/06/2018  1:36 PM EDT ----- Call pt: IgM level, although higher than last time, still well within normal range.

## 2018-05-11 ENCOUNTER — Other Ambulatory Visit (HOSPITAL_COMMUNITY): Payer: Commercial Managed Care - PPO

## 2018-05-19 ENCOUNTER — Other Ambulatory Visit (INDEPENDENT_AMBULATORY_CARE_PROVIDER_SITE_OTHER): Payer: Commercial Managed Care - PPO

## 2018-05-19 DIAGNOSIS — D472 Monoclonal gammopathy: Secondary | ICD-10-CM

## 2018-05-19 DIAGNOSIS — C8307 Small cell B-cell lymphoma, spleen: Secondary | ICD-10-CM

## 2018-05-19 DIAGNOSIS — R768 Other specified abnormal immunological findings in serum: Secondary | ICD-10-CM

## 2018-05-19 DIAGNOSIS — D591 Autoimmune hemolytic anemia, unspecified: Secondary | ICD-10-CM

## 2018-05-20 ENCOUNTER — Other Ambulatory Visit (HOSPITAL_COMMUNITY): Payer: Commercial Managed Care - PPO

## 2018-05-20 ENCOUNTER — Telehealth: Payer: Self-pay | Admitting: *Deleted

## 2018-05-20 LAB — CBC WITH DIFFERENTIAL/PLATELET
Basophils Absolute: 0 10*3/uL (ref 0.0–0.2)
Basos: 0 %
EOS (ABSOLUTE): 0.1 10*3/uL (ref 0.0–0.4)
Eos: 1 %
Hematocrit: 40.9 % (ref 34.0–46.6)
Hemoglobin: 13.7 g/dL (ref 11.1–15.9)
IMMATURE GRANULOCYTES: 1 %
Immature Grans (Abs): 0 10*3/uL (ref 0.0–0.1)
Lymphocytes Absolute: 1.4 10*3/uL (ref 0.7–3.1)
Lymphs: 22 %
MCH: 31.5 pg (ref 26.6–33.0)
MCHC: 33.5 g/dL (ref 31.5–35.7)
MCV: 94 fL (ref 79–97)
Monocytes Absolute: 0.3 10*3/uL (ref 0.1–0.9)
Monocytes: 5 %
NEUTROS PCT: 71 %
Neutrophils Absolute: 4.4 10*3/uL (ref 1.4–7.0)
PLATELETS: 155 10*3/uL (ref 150–450)
RBC: 4.35 x10E6/uL (ref 3.77–5.28)
RDW: 14.9 % (ref 12.3–15.4)
WBC: 6.2 10*3/uL (ref 3.4–10.8)

## 2018-05-20 LAB — RETICULOCYTES: RETIC CT PCT: 4.3 % — AB (ref 0.6–2.6)

## 2018-05-20 MED ORDER — PREDNISONE 20 MG PO TABS: 20.0000 mg | ORAL_TABLET | Freq: Every day | ORAL | Status: DC

## 2018-05-20 NOTE — Telephone Encounter (Signed)
Pt called informed "Hb & platelets back to normal; stay on prednisone 20 mg daily; CBC diff next week " per Dr Beryle Beams. Lab appt scheduled for Wed 8/14 @ 0930 AM. And is scheduled for PET scan on Friday.

## 2018-05-20 NOTE — Telephone Encounter (Signed)
-----   Message from Annia Belt, MD sent at 05/20/2018  6:51 AM EDT ----- Call pt: Hb & platelets back to normal; stay on prednisone 20 mg daily; CBC diff next week

## 2018-05-21 ENCOUNTER — Encounter (HOSPITAL_COMMUNITY)
Admission: RE | Admit: 2018-05-21 | Discharge: 2018-05-21 | Disposition: A | Discharge status: Home / Self Care | Payer: Commercial Managed Care - PPO | Source: Ambulatory Visit | Attending: Oncology | Admitting: Oncology

## 2018-05-21 DIAGNOSIS — D472 Monoclonal gammopathy: Secondary | ICD-10-CM | POA: Diagnosis present

## 2018-05-21 DIAGNOSIS — C8307 Small cell B-cell lymphoma, spleen: Secondary | ICD-10-CM | POA: Diagnosis present

## 2018-05-21 DIAGNOSIS — R768 Other specified abnormal immunological findings in serum: Secondary | ICD-10-CM | POA: Diagnosis present

## 2018-05-21 DIAGNOSIS — D591 Autoimmune hemolytic anemia, unspecified: Secondary | ICD-10-CM

## 2018-05-21 LAB — GLUCOSE, CAPILLARY: Glucose-Capillary: 90 mg/dL (ref 70–99)

## 2018-05-21 MED ORDER — FLUDEOXYGLUCOSE F - 18 (FDG) INJECTION
10.7800 | Freq: Once | INTRAVENOUS | Status: AC
  Administered 2018-05-21: 10.78 via INTRAVENOUS

## 2018-05-26 ENCOUNTER — Other Ambulatory Visit: Payer: Commercial Managed Care - PPO

## 2018-05-26 ENCOUNTER — Encounter: Payer: Self-pay | Admitting: Oncology

## 2018-05-31 ENCOUNTER — Ambulatory Visit (INDEPENDENT_AMBULATORY_CARE_PROVIDER_SITE_OTHER): Payer: Commercial Managed Care - PPO | Admitting: Oncology

## 2018-05-31 ENCOUNTER — Encounter: Payer: Self-pay | Admitting: Oncology

## 2018-05-31 ENCOUNTER — Other Ambulatory Visit: Payer: Self-pay

## 2018-05-31 VITALS — BP 135/70 | HR 72 | Temp 97.9°F | Ht 67.0 in | Wt 216.5 lb

## 2018-05-31 DIAGNOSIS — R768 Other specified abnormal immunological findings in serum: Secondary | ICD-10-CM | POA: Diagnosis not present

## 2018-05-31 DIAGNOSIS — D472 Monoclonal gammopathy: Secondary | ICD-10-CM

## 2018-05-31 DIAGNOSIS — Z8719 Personal history of other diseases of the digestive system: Secondary | ICD-10-CM

## 2018-05-31 DIAGNOSIS — H9201 Otalgia, right ear: Secondary | ICD-10-CM

## 2018-05-31 DIAGNOSIS — Z9221 Personal history of antineoplastic chemotherapy: Secondary | ICD-10-CM

## 2018-05-31 DIAGNOSIS — D591 Autoimmune hemolytic anemia, unspecified: Secondary | ICD-10-CM

## 2018-05-31 DIAGNOSIS — C8307 Small cell B-cell lymphoma, spleen: Secondary | ICD-10-CM | POA: Diagnosis not present

## 2018-05-31 DIAGNOSIS — R7689 Other specified abnormal immunological findings in serum: Secondary | ICD-10-CM

## 2018-05-31 LAB — CBC WITH DIFFERENTIAL/PLATELET
Abs Immature Granulocytes: 0 10*3/uL (ref 0.0–0.1)
Basophils Absolute: 0 10*3/uL (ref 0.0–0.1)
Basophils Relative: 0 %
EOS ABS: 0 10*3/uL (ref 0.0–0.7)
Eosinophils Relative: 0 %
HEMATOCRIT: 38 % (ref 36.0–46.0)
Hemoglobin: 13 g/dL (ref 12.0–15.0)
Immature Granulocytes: 1 %
LYMPHS ABS: 1.1 10*3/uL (ref 0.7–4.0)
Lymphocytes Relative: 17 %
MCH: 31.3 pg (ref 26.0–34.0)
MCHC: 34.2 g/dL (ref 30.0–36.0)
MCV: 91.3 fL (ref 78.0–100.0)
Monocytes Absolute: 0.5 10*3/uL (ref 0.1–1.0)
Monocytes Relative: 8 %
Neutro Abs: 4.7 10*3/uL (ref 1.7–7.7)
Neutrophils Relative %: 74 %
Platelets: 145 10*3/uL — ABNORMAL LOW (ref 150–400)
RBC: 4.16 MIL/uL (ref 3.87–5.11)
RDW: 14.4 % (ref 11.5–15.5)
WBC: 6.3 10*3/uL (ref 4.0–10.5)

## 2018-05-31 LAB — RETICULOCYTES
RBC.: 4.16 MIL/uL (ref 3.87–5.11)
Retic Count, Absolute: 278.7 10*3/uL — ABNORMAL HIGH (ref 19.0–186.0)
Retic Ct Pct: 6.7 % — ABNORMAL HIGH (ref 0.4–3.1)

## 2018-05-31 NOTE — Patient Instructions (Addendum)
Schedule CT guided bone marrow biopsy:  Re-stage lymphoma Follow up visit 9/9  Lab day of visit or a few days before per pt schedule

## 2018-05-31 NOTE — Addendum Note (Signed)
Addended by: Orson Gear on: 05/31/2018 09:08 AM   Modules accepted: Orders

## 2018-06-01 ENCOUNTER — Encounter: Payer: Self-pay | Admitting: Oncology

## 2018-06-01 ENCOUNTER — Telehealth: Payer: Self-pay | Admitting: *Deleted

## 2018-06-01 MED ORDER — PREDNISONE 20 MG PO TABS: ORAL_TABLET | ORAL | Status: DC

## 2018-06-01 NOTE — Progress Notes (Signed)
Hematology and Oncology Follow Up Visit  Kara Perkins [id number] 06-27-1971 47 y.o. 06/01/2018 2:06 PM   Principle Diagnosis: Encounter Diagnoses  Name Primary?  . Positive Coombs test   . AIHA (autoimmune hemolytic anemia) (HCC)   . IgM monoclonal gammopathy of uncertain significance   . Lymphoma, marginal zone, spleen ([place]) Yes  Clinical summary: Please see progress note dated May 04, 2018 for complete details of previous diagnosis and treatments.  Interim History: Short interval follow-up visit.  She did respond to reinduction with steroids with normalization of her hemoglobin and platelet count and fall in her reticulocyte count.  I drop the dose down to 20 mg daily.  She has become markedly cushingoid.  Weight up to 216.8 pounds compared with presteroid weight of 203 pounds recorded at time of April 30 visit. 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  I reviewed the study in advance with the radiologist to read it.  Compared to the post treatment scan done when she was in remission in October 2015, spleen activity now at approximate level of the background liver activity SUV 3.5 which radiologist tells me would be considered normal except for the fact that on the remission study, spleen was smaller and SUV was 2.8.  In addition, there are some small lymph nodes right cervical and left axillary which are now positive but not clinically palpable.  There is mildly increased but diffuse activity in the skeleton which may reflect low-level bone marrow infiltration.  Medications: reviewed  Allergies: No Known Allergies  Review of Systems: Right otalgia from soft tissue swelling right periauricular area See interim history Remaining ROS negative:   Physical Exam: Blood pressure 135/70, pulse 72, temperature 97.9 F (36.6 C), temperature source Oral, height 5\' 7"  (1.702 m), weight 216 lb 8 oz (98.2 kg), SpO2  100 %. Wt Readings from Last 3 Encounters:  05/31/18 216 lb 8 oz (98.2 kg)  05/04/18 217 lb 4.8 oz (98.6 kg)  03/03/18 211 lb 4.8 oz (95.8 kg)     General appearance: Cushingoid Caucasian woman HENNT: Right tympanic membrane appears normal.  Pharynx no erythema, exudate, mass, or ulcer. No thyromegaly or thyroid nodules Lymph nodes: No cervical, supraclavicular, or axillary lymphadenopathy Breasts:  Lungs: Clear to auscultation, resonant to percussion throughout Heart: Regular rhythm, no murmur, no gallop, no rub, no click, no edema Abdomen: Soft, nontender, normal bowel sounds, no mass, no organomegaly Extremities: No edema, no calf tenderness Musculoskeletal: no joint deformities GU:  Vascular: Carotid pulses 2+, no bruits,  Neurologic: Alert, oriented, PERRLA,  cranial nerves grossly normal, motor strength 5 over 5, reflexes 1+ symmetric, upper body coordination normal, gait normal, Skin: No rash or ecchymosis  Lab Results: CBC W/Diff    Component Value Date/Time   WBC 6.3 05/31/2018 0908   RBC 4.16 05/31/2018 0908   RBC 4.16 05/31/2018 0908   HGB 13.0 05/31/2018 0908   HGB 13.7 05/19/2018 0837   HGB 11.3 (L) 01/30/2015 0923   HCT 38.0 05/31/2018 0908   HCT 40.9 05/19/2018 0837   HCT 34.9 01/30/2015 0923   PLT 145 (L) 05/31/2018 0908   PLT 155 05/19/2018 0837   MCV 91.3 05/31/2018 0908   MCV 94 05/19/2018 0837   MCV 81.7 01/30/2015 0923   MCH 31.3 05/31/2018 0908   MCHC 34.2 05/31/2018 0908   RDW 14.4 05/31/2018 0908   RDW 14.9 05/19/2018 0837   RDW 15.7 (H)  01/30/2015 0923   LYMPHSABS 1.1 05/31/2018 0908   LYMPHSABS 1.4 05/19/2018 0837   LYMPHSABS 0.3 (L) 01/30/2015 0923   MONOABS 0.5 05/31/2018 0908   MONOABS 0.2 01/30/2015 0923   EOSABS 0.0 05/31/2018 0908   EOSABS 0.1 05/19/2018 0837   BASOSABS 0.0 05/31/2018 0908   BASOSABS 0.0 05/19/2018 0837   BASOSABS 0.0 01/30/2015 0923     Chemistry      Component Value Date/Time   NA 141 05/04/2018 0844   NA  139 02/04/2018 0827   NA 139 01/30/2015 0924   K 3.8 05/04/2018 0844   K 4.8 01/30/2015 0924   CL 103 05/04/2018 0844   CO2 30 05/04/2018 0844   CO2 25 01/30/2015 0924   BUN 16 05/04/2018 0844   BUN 16 02/04/2018 0827   BUN 11.5 01/30/2015 0924   CREATININE 0.89 05/04/2018 0844   CREATININE 0.8 01/30/2015 0924      Component Value Date/Time   CALCIUM 8.9 05/04/2018 0844   CALCIUM 9.1 01/30/2015 0924   ALKPHOS 39 05/04/2018 0844   ALKPHOS 77 01/30/2015 0924   AST 18 05/04/2018 0844   AST 21 01/30/2015 0924   ALT 70 (H) 05/04/2018 0844   ALT 27 01/30/2015 0924   BILITOT 0.9 05/04/2018 0844   BILITOT 1.2 02/04/2018 0827   BILITOT 0.48 01/30/2015 1696       Radiological Studies: Nm Pet Image Restag (ps) Skull Base To Thigh  Result Date: 05/21/2018 CLINICAL DATA:  Subsequent treatment strategy for splenic marginal zone lymphoma diagnosed in 2015. Autoimmune hemolytic anemia. EXAM: NUCLEAR MEDICINE PET SKULL BASE TO THIGH TECHNIQUE: 10.8 mCi F-18 FDG was injected intravenously. Full-ring PET imaging was performed from the skull base to thigh after the radiotracer. CT data was obtained and used for attenuation correction and anatomic localization. Fasting blood glucose: 90 mg/dl COMPARISON:  Multiple exams, including 07/31/2014 FINDINGS: Mediastinal blood pool activity: SUV max 2.3 Background hepatic activity: SUV max 3.5 NECK: A right level Ia lymph node measuring 8 mm in short axis on image 31/4 has a maximum standard uptake value of 4.5. Previously this measured 6 mm in diameter and had a maximum standard uptake value of 1.2. Incidental CT findings: none CHEST: A left upper axillary lymph node measuring 8 mm in short axis (although much of this is fatty hilum) has a maximum SUV of 4.4 (image 50/4), previously not metabolically active. Other smaller axillary lymph nodes have very low-grade activity. Incidental CT findings: none ABDOMEN/PELVIS: No focal splenic lesion. Splenic background  activity SUV 4.3. The spleen measures 12.6 by 6.3 by 16.4 cm (volume = 680 cm^3). Mildly accentuated activity in the gastric antrum, maximum SUV 6.0, likely physiologic. There no thickening of the gastric antrum beyond that expected for the degree of nondistention. There is activity along the vaginal vestibule and perineum without appreciable mass, most likely related to a small amount of urinary incontinence. Incidental CT findings: Cholecystectomy. SKELETON: Diffuse mildly increased activity throughout the skeleton. For example, sampling in the vicinity of the L1 vertebral body up to maximum SUV of 5.6, previously 3.6. No corresponding CT findings. Incidental CT findings: none IMPRESSION: 1. 8 mm in short axis right level I a lymph node with Deauville 4 activity, increased from prior. 2. Small but mildly hypermetabolic left axillary lymph node with Deauville 4 activity. 3. Splenic activity only minimally greater than that of the liver, but with a moderate amount of splenomegaly (splenic volume 680 cubic cm). No focal splenic lesion observed. 4. Diffuse mildly  increased activity in the skeleton without corresponding CT abnormality. Possibilities might include low-level marrow infiltration or granulocyte stimulation. Electronically Signed   By: Van Clines M.D.   On: 05/21/2018 16:00    Impression:  1.  Coombs positive autoimmune hemolytic anemia/paraneoplastic manifestation of marginal zone lymphoma. Lab return after visit.  Hemoglobin 13, but dip in platelet count and rise and retake count.  Continuing current high doses of steroids not tenable.  I am going to decrease her dose to 10 mg daily pending further treatment which will be outlined below.  2.  Early relapse of marginal zone lymphoma.   4-year progression free survival following cladribine plus Rituxan given July through October 2015. There have been a number of advances in the field with respect to treatment of low-grade non-Hodgkin's  lymphoma.  We discussed these possibilities at time of her recent July visit.  Data favors using a Bruton's tyrosine kinase inhibitor which so far has given the best responses.  Whether or not combination therapy with a anti-CD20 antibody will be superior but is not clear yet and requires prospective clinical trials. We discussed the potential side effects of ibrutinib.  These include increased risk for bleeding due to a qualitative platelet defect, hypertension, primarily atrial arrhythmias, and increased risk for infection particularly fungal infections in the first few months on treatment.  Other downside is there is no defined endpoint to therapy. I am going to schedule her for a bone marrow aspiration and biopsy to complete her staging.  I will explore clinical trial availability.  If anything looks more promising than ibrutinib, I will certainly refer her. In view of the immune hemolytic anemia, I may elect to add Rituxan to the ibrutinib to treat the immune manifestation of her lymphoma.  CC: Patient Care Team: Sofie Hartigan, MD as PCP - General (Family Medicine) Annia Belt, MD as Consulting Physician (Oncology) Heath Lark, MD as Consulting Physician (Hematology and Oncology)   Murriel Hopper, MD, [place]  Hematology-Oncology/Internal Medicine     8/20/20192:06 PM

## 2018-06-01 NOTE — Telephone Encounter (Signed)
-----   Message from Annia Belt, MD sent at 05/31/2018  1:37 PM EDT ----- Call Pt: Hb 13, platelets 145,000 OK to decrease prednisone to 10 mg daily

## 2018-06-01 NOTE — Telephone Encounter (Signed)
Pt called / informed "Hb 13, platelets 145,000 OK to decrease prednisone to 10 mg daily " per Dr Beryle Beams. Voiced understanding. Stated BM Bx is scheduled for 8/30.

## 2018-06-09 ENCOUNTER — Other Ambulatory Visit: Payer: Self-pay | Admitting: Radiology

## 2018-06-10 ENCOUNTER — Other Ambulatory Visit: Payer: Self-pay | Admitting: Student

## 2018-06-11 ENCOUNTER — Ambulatory Visit (HOSPITAL_COMMUNITY)
Admission: RE | Admit: 2018-06-11 | Discharge: 2018-06-11 | Disposition: A | Discharge status: Home / Self Care | Payer: Commercial Managed Care - PPO | Source: Ambulatory Visit | Attending: Oncology | Admitting: Oncology

## 2018-06-11 ENCOUNTER — Encounter (HOSPITAL_COMMUNITY): Payer: Self-pay

## 2018-06-11 DIAGNOSIS — Z9049 Acquired absence of other specified parts of digestive tract: Secondary | ICD-10-CM | POA: Insufficient documentation

## 2018-06-11 DIAGNOSIS — D591 Other autoimmune hemolytic anemias: Secondary | ICD-10-CM | POA: Diagnosis not present

## 2018-06-11 DIAGNOSIS — D72819 Decreased white blood cell count, unspecified: Secondary | ICD-10-CM | POA: Insufficient documentation

## 2018-06-11 DIAGNOSIS — C8580 Other specified types of non-Hodgkin lymphoma, unspecified site: Secondary | ICD-10-CM | POA: Insufficient documentation

## 2018-06-11 DIAGNOSIS — C8307 Small cell B-cell lymphoma, spleen: Secondary | ICD-10-CM | POA: Insufficient documentation

## 2018-06-11 DIAGNOSIS — R161 Splenomegaly, not elsewhere classified: Secondary | ICD-10-CM | POA: Insufficient documentation

## 2018-06-11 DIAGNOSIS — D696 Thrombocytopenia, unspecified: Secondary | ICD-10-CM | POA: Diagnosis not present

## 2018-06-11 LAB — CBC WITH DIFFERENTIAL/PLATELET
BASOS PCT: 0 %
Basophils Absolute: 0 10*3/uL (ref 0.0–0.1)
Eosinophils Absolute: 0 10*3/uL (ref 0.0–0.7)
Eosinophils Relative: 1 %
HEMATOCRIT: 34.2 % — AB (ref 36.0–46.0)
Hemoglobin: 12 g/dL (ref 12.0–15.0)
Lymphocytes Relative: 27 %
Lymphs Abs: 1 10*3/uL (ref 0.7–4.0)
MCH: 31.5 pg (ref 26.0–34.0)
MCHC: 35.1 g/dL (ref 30.0–36.0)
MCV: 89.8 fL (ref 78.0–100.0)
MONO ABS: 0.2 10*3/uL (ref 0.1–1.0)
Monocytes Relative: 6 %
NEUTROS ABS: 2.3 10*3/uL (ref 1.7–7.7)
NEUTROS PCT: 66 %
PLATELETS: 135 10*3/uL — AB (ref 150–400)
RBC: 3.81 MIL/uL — ABNORMAL LOW (ref 3.87–5.11)
RDW: 14.6 % (ref 11.5–15.5)
WBC: 3.6 10*3/uL — AB (ref 4.0–10.5)

## 2018-06-11 LAB — PROTIME-INR
INR: 0.93
Prothrombin Time: 12.3 seconds (ref 11.4–15.2)

## 2018-06-11 MED ORDER — MIDAZOLAM HCL 2 MG/2ML IJ SOLN
INTRAMUSCULAR | Status: AC | PRN
  Administered 2018-06-11 (×2): 1 mg via INTRAVENOUS

## 2018-06-11 MED ORDER — FENTANYL CITRATE (PF) 100 MCG/2ML IJ SOLN
INTRAMUSCULAR | Status: AC | PRN
  Administered 2018-06-11 (×2): 50 ug via INTRAVENOUS

## 2018-06-11 MED ORDER — FLUMAZENIL 0.5 MG/5ML IV SOLN
INTRAVENOUS | Status: AC
  Filled 2018-06-11: qty 5

## 2018-06-11 MED ORDER — HYDROCODONE-ACETAMINOPHEN 5-325 MG PO TABS: 1.0000 | ORAL_TABLET | ORAL | Status: DC | PRN

## 2018-06-11 MED ORDER — FENTANYL CITRATE (PF) 100 MCG/2ML IJ SOLN
INTRAMUSCULAR | Status: AC
  Filled 2018-06-11: qty 4

## 2018-06-11 MED ORDER — NALOXONE HCL 0.4 MG/ML IJ SOLN
INTRAMUSCULAR | Status: AC
  Filled 2018-06-11: qty 1

## 2018-06-11 MED ORDER — MIDAZOLAM HCL 2 MG/2ML IJ SOLN
INTRAMUSCULAR | Status: AC
  Filled 2018-06-11: qty 4

## 2018-06-11 MED ORDER — SODIUM CHLORIDE 0.9 % IV SOLN
INTRAVENOUS | Status: DC
  Administered 2018-06-11: 08:00:00 via INTRAVENOUS

## 2018-06-11 NOTE — Discharge Instructions (Signed)

## 2018-06-11 NOTE — H&P (Signed)
Chief Complaint: Patient was seen in consultation today for bone marrow biopsy at the request of [place] M  Referring Physician(s): [place] M  Supervising Physician: Markus Daft  Patient Status: [facility] - Out-pt  History of Present Illness: Kara Perkins is a 47 y.o. female with hx of lymphoma. She is referred for bone marrow biopsy. She has had this twice before, performed by Dr. Beryle Beams, never with IR. PMHx, meds, labs, imaging, allergies reviewed. Feels well, no recent fevers, chills, illness. Has been NPO today as directed. Family at bedside.   Past Medical History:  Diagnosis Date  . Constipation 05/11/2014  . 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 Iron deficiency anemia 11/05/2011  . 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 Perimenopausal menorrhagia 07/26/2013  . Positive Coombs test   . Positive Coombs test 11/05/2011    Past Surgical History:  Procedure Laterality Date  . CHOLECYSTECTOMY    . DILATATION & CURETTAGE/HYSTEROSCOPY WITH MYOSURE N/A 12/06/2016   Procedure: DILATATION & CURETTAGE/HYSTEROSCOPY WITH MYOSURE;  Surgeon: Everlene Farrier, MD;  Location: [facility];  Service: Gynecology;  Laterality: N/A;  HOLD MYSOSURE ITEMS    Allergies: Patient has no known allergies.  Medications: Prior to Admission medications   Medication Sig Start Date End Date Taking? Authorizing Provider  fluticasone (FLONASE) 50 MCG/ACT nasal spray Place 1-2 sprays into both nostrils daily as needed for rhinitis.    Yes [provider]  folic acid (FOLVITE) 1 MG tablet Take 1 tablet (1 mg total) by mouth daily. 02/09/18 02/09/19 Yes Annia Belt, MD  ibuprofen (ADVIL,MOTRIN) 200 MG tablet Take 400-800 mg by mouth every 6 (six) hours as needed for headache, mild pain or moderate pain.    Yes [provider]  iron polysaccharides (NIFEREX) 150 MG capsule Take 1 capsule (150 mg total) by mouth 2 (two)  times daily. 04/08/18  Yes Sid Falcon, MD  pantoprazole (PROTONIX) 20 MG tablet Take 1 tablet (20 mg total) by mouth daily. 02/09/18 02/09/19 Yes Annia Belt, MD  predniSONE (DELTASONE) 20 MG tablet Take half tab (10 mg) by mouth daily with breakfast. 06/01/18  Yes Annia Belt, MD     History reviewed. No pertinent family history.  Social History   Socioeconomic History  . Marital status: Married    Spouse name: Not on file  . Number of children: Not on file  . Years of education: Not on file  . Highest education level: Not on file  Occupational History  . Not on file  Social Needs  . Financial resource strain: Not on file  . Food insecurity:    Worry: Not on file    Inability: Not on file  . Transportation needs:    Medical: Not on file    Non-medical: Not on file  Tobacco Use  . Smoking status: Never Smoker  . Smokeless tobacco: Never Used  Substance and Sexual Activity  . Alcohol use: No    Alcohol/week: 0.0 standard drinks  . Drug use: No  . Sexual activity: Yes  Lifestyle  . Physical activity:    Days per week: Not on file    Minutes per session: Not on file  . Stress: Not on file  Relationships  . Social connections:    Talks on phone: Not on file    Gets together: Not on file    Attends religious service: Not on file    Active member of club or organization: Not on file  Attends meetings of clubs or organizations: Not on file    Relationship status: Not on file  Other Topics Concern  . Not on file  Social History Narrative  . Not on file     Review of Systems: A 12 point ROS discussed and pertinent positives are indicated in the HPI above.  All other systems are negative.  Review of Systems  Vital Signs: BP (!) 163/93   Pulse 71   Temp 98.2 F (36.8 C)   Resp 18   SpO2 100%   Physical Exam  Constitutional: She is oriented to person, place, and time. She appears well-developed. No distress.  HENT:  Head: Normocephalic.    Mouth/Throat: Oropharynx is clear and moist.  Neck: Normal range of motion. No JVD present.  Cardiovascular: Normal rate, regular rhythm and normal heart sounds.  Pulmonary/Chest: Effort normal and breath sounds normal. No respiratory distress.  Neurological: She is alert and oriented to person, place, and time.  Skin: Skin is warm and dry.  Psychiatric: She has a normal mood and affect. Judgment normal.    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 EXAM: NUCLEAR MEDICINE PET SKULL BASE TO THIGH TECHNIQUE: 10.8 mCi F-18 FDG was injected intravenously. Full-ring PET imaging was performed from the skull base to thigh after the radiotracer. CT data was obtained and used for attenuation correction and anatomic localization. Fasting blood glucose: 90 mg/dl COMPARISON:  Multiple exams, including 07/31/2014 FINDINGS: Mediastinal blood pool activity: SUV max 2.3 Background hepatic activity: SUV max 3.5 NECK: A right level Ia lymph node measuring 8 mm in short axis on image 31/4 has a maximum standard uptake value of 4.5. Previously this measured 6 mm in diameter and had a maximum standard uptake value of 1.2. Incidental CT findings: none CHEST: A left upper axillary lymph node measuring 8 mm in short axis (although much of this is fatty hilum) has a maximum SUV of 4.4 (image 50/4), previously not metabolically active. Other smaller axillary lymph nodes have very low-grade activity. Incidental CT findings: none ABDOMEN/PELVIS: No focal splenic lesion. Splenic background activity SUV 4.3. The spleen measures 12.6 by 6.3 by 16.4 cm (volume = 680 cm^3). Mildly accentuated activity in the gastric antrum, maximum SUV 6.0, likely physiologic. There no thickening of the gastric antrum beyond that expected for the degree of nondistention. There is activity  along the vaginal vestibule and perineum without appreciable mass, most likely related to a small amount of urinary incontinence. Incidental CT findings: Cholecystectomy. SKELETON: Diffuse mildly increased activity throughout the skeleton. For example, sampling in the vicinity of the L1 vertebral body up to maximum SUV of 5.6, previously 3.6. No corresponding CT findings. Incidental CT findings: none IMPRESSION: 1. 8 mm in short axis right level I a lymph node with Deauville 4 activity, increased from prior. 2. Small but mildly hypermetabolic left axillary lymph node with Deauville 4 activity. 3. Splenic activity only minimally greater than that of the liver, but with a moderate amount of splenomegaly (splenic volume 680 cubic cm). No focal splenic lesion observed. 4. Diffuse mildly increased activity in the skeleton without corresponding CT abnormality. Possibilities might include low-level marrow infiltration or granulocyte stimulation. Electronically Signed   By: Van Clines M.D.   On: 05/21/2018 16:00    Labs:  CBC: Recent Labs    05/04/18 0844 05/19/18 0837 05/31/18 0908 06/11/18  0744  WBC 6.1 6.2 6.3 3.6*  HGB 12.9 13.7 13.0 12.0  HCT 38.9 40.9 38.0 34.2*  PLT 130* 155 145* 135*    COAGS: Recent Labs    06/11/18 0744  INR 0.93    BMP: Recent Labs    02/04/18 0827 02/09/18 1205 05/04/18 0844  NA 139 137 141  K 4.1 4.0 3.8  CL 104 105 103  CO2 '22 27 30  ' GLUCOSE 91 77 101*  BUN '16 10 16  ' CALCIUM 8.6* 8.8* 8.9  CREATININE 0.88 0.87 0.89  GFRNONAA 79 >60 >60  GFRAA 91 >60 >60    LIVER FUNCTION TESTS: Recent Labs    02/04/18 0827 02/09/18 1205 05/04/18 0844  BILITOT 1.2 1.3* 0.9  AST 31 35 18  ALT 72* 68* 70*  ALKPHOS 56 55 39  PROT 6.1 6.4* 5.9*  ALBUMIN 4.2 4.0 3.8    TUMOR MARKERS: No results for input(s): AFPTM, CEA, CA199, CHROMGRNA in the last 8760 hours.  Assessment and Plan: Lymphoma For CT guided bone marrow biopsy Labs ok Risks and  benefits discussed with the patient including, but not limited to bleeding, infection, damage to adjacent structures or low yield requiring additional tests.  All of the patient's questions were answered, patient is agreeable to proceed. Consent signed and in chart.    Thank you for this interesting consult.  I greatly enjoyed meeting JOSSALYN FORGIONE and look forward to participating in their care.  A copy of this report was sent to the requesting provider on this date.  Electronically Signed: Ascencion Dike, PA-C 06/11/2018, 8:38 AM   I spent a total of 20 minutes in face to face in clinical consultation, greater than 50% of which was counseling/coordinating care for bone marrow biopsy

## 2018-06-11 NOTE — Procedures (Signed)
CT guided bone marrow aspiration and biopsy.  2 aspirates and 1 core from right ilium.  Minimal blood loss and no immediate complication.

## 2018-06-16 ENCOUNTER — Telehealth: Payer: Self-pay | Admitting: *Deleted

## 2018-06-16 NOTE — Telephone Encounter (Addendum)
Call made to patient informing her that DrGranfortuna is considering starting her on Ibrutinib 560mg  once daily #30.  This medication is very expensive-pt was given information to enroll in a copay discount program to see if she is eligible to received medication for a 10 copay.  Pt given ph# [phone] and website ([email or web address]).  Pt will follow up with me during visit with DrGranfortuna on 9/10.[name]9/4/20194:30 PM

## 2018-06-21 ENCOUNTER — Other Ambulatory Visit: Payer: Self-pay | Admitting: Oncology

## 2018-06-21 DIAGNOSIS — C8307 Small cell B-cell lymphoma, spleen: Secondary | ICD-10-CM

## 2018-06-21 DIAGNOSIS — D591 Autoimmune hemolytic anemia, unspecified: Secondary | ICD-10-CM

## 2018-06-22 ENCOUNTER — Other Ambulatory Visit: Payer: Self-pay | Admitting: Oncology

## 2018-06-22 ENCOUNTER — Other Ambulatory Visit: Payer: Self-pay

## 2018-06-22 ENCOUNTER — Telehealth: Payer: Self-pay | Admitting: *Deleted

## 2018-06-22 ENCOUNTER — Ambulatory Visit (INDEPENDENT_AMBULATORY_CARE_PROVIDER_SITE_OTHER): Payer: Commercial Managed Care - PPO | Admitting: Oncology

## 2018-06-22 ENCOUNTER — Other Ambulatory Visit: Payer: Self-pay | Admitting: *Deleted

## 2018-06-22 ENCOUNTER — Encounter: Payer: Self-pay | Admitting: Oncology

## 2018-06-22 VITALS — BP 147/74 | HR 70 | Temp 97.9°F | Ht 67.0 in | Wt 218.6 lb

## 2018-06-22 DIAGNOSIS — C8307 Small cell B-cell lymphoma, spleen: Secondary | ICD-10-CM

## 2018-06-22 DIAGNOSIS — D591 Autoimmune hemolytic anemia, unspecified: Secondary | ICD-10-CM

## 2018-06-22 DIAGNOSIS — Z9221 Personal history of antineoplastic chemotherapy: Secondary | ICD-10-CM | POA: Diagnosis not present

## 2018-06-22 DIAGNOSIS — Z7952 Long term (current) use of systemic steroids: Secondary | ICD-10-CM

## 2018-06-22 DIAGNOSIS — Z8572 Personal history of non-Hodgkin lymphomas: Secondary | ICD-10-CM | POA: Diagnosis not present

## 2018-06-22 LAB — COMPREHENSIVE METABOLIC PANEL
ALBUMIN: 3.9 g/dL (ref 3.5–5.0)
ALK PHOS: 48 U/L (ref 38–126)
ALT: 65 U/L — AB (ref 0–44)
AST: 25 U/L (ref 15–41)
Anion gap: 8 (ref 5–15)
BUN: 16 mg/dL (ref 6–20)
CHLORIDE: 102 mmol/L (ref 98–111)
CO2: 28 mmol/L (ref 22–32)
Calcium: 9 mg/dL (ref 8.9–10.3)
Creatinine, Ser: 0.97 mg/dL (ref 0.44–1.00)
GFR calc Af Amer: >60 mL/min (ref >60)
GFR calc non Af Amer: >60 mL/min (ref >60)
GLUCOSE: 91 mg/dL (ref 70–99)
Potassium: 3.8 mmol/L (ref 3.5–5.1)
SODIUM: 138 mmol/L (ref 135–145)
Total Bilirubin: 1.4 mg/dL — ABNORMAL HIGH (ref 0.3–1.2)
Total Protein: 6.3 g/dL — ABNORMAL LOW (ref 6.5–8.1)

## 2018-06-22 LAB — CBC WITH DIFFERENTIAL/PLATELET
Abs Immature Granulocytes: 0 10*3/uL (ref 0.0–0.1)
Basophils Absolute: 0 10*3/uL (ref 0.0–0.1)
Basophils Relative: 1 %
Eosinophils Absolute: 0.1 10*3/uL (ref 0.0–0.7)
Eosinophils Relative: 2 %
HCT: 37.9 % (ref 36.0–46.0)
HEMOGLOBIN: 12.9 g/dL (ref 12.0–15.0)
Immature Granulocytes: 0 %
LYMPHS PCT: 27 %
Lymphs Abs: 1 10*3/uL (ref 0.7–4.0)
MCH: 31.2 pg (ref 26.0–34.0)
MCHC: 34 g/dL (ref 30.0–36.0)
MCV: 91.8 fL (ref 78.0–100.0)
MONOS PCT: 13 %
Monocytes Absolute: 0.4 10*3/uL (ref 0.1–1.0)
Neutro Abs: 2 10*3/uL (ref 1.7–7.7)
Neutrophils Relative %: 57 %
Platelets: 132 10*3/uL — ABNORMAL LOW (ref 150–400)
RBC: 4.13 MIL/uL (ref 3.87–5.11)
RDW: 14.4 % (ref 11.5–15.5)
WBC: 3.5 10*3/uL — ABNORMAL LOW (ref 4.0–10.5)

## 2018-06-22 LAB — RETICULOCYTES
RBC.: 4.13 MIL/uL (ref 3.87–5.11)
Retic Count, Absolute: 227.2 10*3/uL — ABNORMAL HIGH (ref 19.0–186.0)
Retic Ct Pct: 5.5 % — ABNORMAL HIGH (ref 0.4–3.1)

## 2018-06-22 LAB — LACTATE DEHYDROGENASE: LDH: 234 U/L — ABNORMAL HIGH (ref 98–192)

## 2018-06-22 MED ORDER — PREDNISONE 10 MG PO TABS: ORAL_TABLET | ORAL | 2 refills | Status: DC

## 2018-06-22 MED ORDER — PANTOPRAZOLE SODIUM 20 MG PO TBEC: 20.0000 mg | DELAYED_RELEASE_TABLET | Freq: Every day | ORAL | 6 refills | Status: DC

## 2018-06-22 NOTE — Patient Instructions (Addendum)
Pre-authorization for Rituxan IV for first dose then Subcutaneous for 3 doses Schedule first IV dose with short stay at [facility] & then hold appointments for next 3 weeks after Return visit with Dr Darnell Level 10/21  Lab same day

## 2018-06-22 NOTE — Telephone Encounter (Signed)
-----   Message from Annia Belt, MD sent at 06/22/2018  2:56 PM EDT ----- Please see if we can get her 1st dose of IV Rituxan for next Tues 9/17 at short stay [facility]; I put in orders; Zigmund Daniel working on pre-auth. Thanks DrG P.S. When you call her, tell her I refilled her prednisone & sent to [facility]

## 2018-06-22 NOTE — Addendum Note (Signed)
Addended by: Truddie Crumble on: 06/22/2018 08:58 AM   Modules accepted: Orders

## 2018-06-22 NOTE — Progress Notes (Signed)
Hematology and Oncology Follow Up Visit  Kara Perkins [id number] 10-Aug-1971 47 y.o. 06/22/2018 10:20 AM   Principle Diagnosis: Encounter Diagnoses  Name Primary?  Marland Kitchen AIHA (autoimmune hemolytic anemia) (HCC)   . 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  Diagnosis antedated by initial unexplained direct positive Coombs test without hemolysis, IgM monoclonal gammopathy, with rising IgM paraprotein over time and then development of symptomatic splenomegaly and progressive pancytopenia in 2015.  Treated with a combination of cladribine chemotherapy plus Rituxan with achievement of a complete PET response. Stable until April 2019 when she developed acute onset of Coombs positive hemolytic anemia responsive to steroids but recurring when steroids were tapered and she had to be reinduced.  No adenopathy on exam.  No recurrent splenomegaly clinically.  PET scan done on August 9, which I personally reviewed with the radiologist who read the study, shows spleen activity increased compared with the remission study in 2015 but not higher than liver background on current study.  2 areas of minimal new adenopathy in the right neck and left axilla that had normalized on the prior study.  Diffuse increased bone marrow activity. Since last visit she had a bone marrow aspiration and biopsy on August 30.  Results reviewed with hematopathologist.  Currently no evidence for lymphoma involvement on this sample.  No monoclonal B-cell population on flow cytometry.  No lymphoid aggregates.  Marrow hypercellular likely as a response to ongoing hemolysis.  Medications: reviewed  Allergies: No Known Allergies  Review of  Systems: Improving edema with tapering of her prednisone down to 10 mg daily. Remaining ROS negative:   Physical Exam: Blood pressure (!) 147/74, pulse 70, temperature 97.9 F (36.6 C), temperature source Oral, height '5\' 7"'  (1.702 m), weight 218 lb 9.6 oz (99.2 kg), SpO2 99 %. Wt Readings from Last 3 Encounters:  06/22/18 218 lb 9.6 oz (99.2 kg)  05/31/18 216 lb 8 oz (98.2 kg)  05/04/18 217 lb 4.8 oz (98.6 kg)     General appearance: Cushingoid Caucasian woman HENNT: Pharynx no erythema, exudate, mass, or ulcer. No thyromegaly or thyroid nodules Lymph nodes: No cervical, supraclavicular, or axillary lymphadenopathy Breasts:  Lungs: Clear to auscultation, resonant to percussion throughout Heart: Regular rhythm, no murmur, no gallop, no rub, no click, no edema Abdomen: Soft, nontender, normal bowel sounds, no mass, no organomegaly Extremities: No edema, no calf tenderness Musculoskeletal: no joint deformities GU:  Vascular: Carotid pulses 2+, no bruits,  Neurologic: Alert, oriented, PERRLA,  , cranial nerves grossly normal, motor strength 5 over 5, reflexes 1+ symmetric, upper body coordination normal, gait normal, Skin: No rash or ecchymosis  Lab Results: CBC W/Diff    Component Value Date/Time   WBC 3.5 (L) 06/22/2018 0901   RBC 4.13 06/22/2018 0901   RBC 4.13 06/22/2018 0901   HGB 12.9 06/22/2018 0901   HGB 13.7 05/19/2018 0837   HGB 11.3 (L) 01/30/2015 0923   HCT 37.9 06/22/2018 0901   HCT 40.9 05/19/2018 0837   HCT 34.9 01/30/2015 0923   PLT 132 (L) 06/22/2018 0901   PLT 155 05/19/2018 0837   MCV 91.8 06/22/2018 0901   MCV 94 05/19/2018 0837   MCV 81.7 01/30/2015 0923   MCH 31.2 06/22/2018 0901  MCHC 34.0 06/22/2018 0901   RDW 14.4 06/22/2018 0901   RDW 14.9 05/19/2018 0837   RDW 15.7 (H) 01/30/2015 0923   LYMPHSABS 1.0 06/22/2018 0901   LYMPHSABS 1.4 05/19/2018 0837   LYMPHSABS 0.3 (L) 01/30/2015 0923   MONOABS 0.4 06/22/2018 0901   MONOABS 0.2 01/30/2015  0923   EOSABS 0.1 06/22/2018 0901   EOSABS 0.1 05/19/2018 0837   BASOSABS 0.0 06/22/2018 0901   BASOSABS 0.0 05/19/2018 0837   BASOSABS 0.0 01/30/2015 0923     Chemistry      Component Value Date/Time   NA 138 06/22/2018 0901   NA 139 02/04/2018 0827   NA 139 01/30/2015 0924   K 3.8 06/22/2018 0901   K 4.8 01/30/2015 0924   CL 102 06/22/2018 0901   CO2 28 06/22/2018 0901   CO2 25 01/30/2015 0924   BUN 16 06/22/2018 0901   BUN 16 02/04/2018 0827   BUN 11.5 01/30/2015 0924   CREATININE 0.97 06/22/2018 0901   CREATININE 0.8 01/30/2015 0924      Component Value Date/Time   CALCIUM 9.0 06/22/2018 0901   CALCIUM 9.1 01/30/2015 0924   ALKPHOS 48 06/22/2018 0901   ALKPHOS 77 01/30/2015 0924   AST 25 06/22/2018 0901   AST 21 01/30/2015 0924   ALT 65 (H) 06/22/2018 0901   ALT 27 01/30/2015 0924   BILITOT 1.4 (H) 06/22/2018 0901   BILITOT 1.2 02/04/2018 0827   BILITOT 0.48 01/30/2015 0924         Ct Bone Marrow Biopsy & Aspiration  Result Date: 06/11/2018 INDICATION: 47 year old with lymphoma, marginal zone, spleen. EXAM: CT GUIDED BONE MARROW ASPIRATES AND BIOPSY Physician: Stephan Minister. Anselm Pancoast, MD MEDICATIONS: None. ANESTHESIA/SEDATION: Fentanyl 100 mcg IV; Versed 2.0 mg IV Moderate Sedation Time:  11 minutes The patient was continuously monitored during the procedure by the interventional radiology nurse under my direct supervision. COMPLICATIONS: None immediate. PROCEDURE: The procedure was explained to the patient. The risks and benefits of the procedure were discussed and the patient's questions were addressed. Informed consent was obtained from the patient. The patient was placed prone on CT table. Images of the pelvis were obtained. The right side of back was prepped and draped in sterile fashion. The skin and right posterior ilium were anesthetized with 1% lidocaine. 11 gauge bone needle was directed into the right ilium with CT guidance. Two aspirates and one core biopsy were  obtained. Specimens were adequate. Bandage placed over the puncture site. FINDINGS: Bone needle directed into the posterior right ilium. IMPRESSION: CT guided bone marrow aspiration and core biopsy. Electronically Signed   By: Markus Daft M.D.   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  IgM elevated above baseline but still mid range normal when last assessed in July.  Repeat pending today.  In anticipation of her having positive bone marrow findings, I made preliminary plans to start her on ibrutinib.  Although I still think this is a reasonable option, since the main manifestation of her disease right now is the hemolytic anemia, and given her poor tolerance of steroids and recrudescence when steroids are tapered, I would like to initiate treatment with single agent Rituxan and monitor her status over time.  The Rituxan should treat the autoimmune hemolytic anemia and also treat early recurrence of marginal zone lymphoma.  Despite recent advances in the field with novel  agents, Rituxan is still listed as a primary choice for first relapse in the current NCCN guidelines.  I anticipate that we will add the ibrutinib in the future and although it would also be reasonable to use combination therapy with Rituxan and ibrutinib I am going to hold off on the ibrutinib for now.  Plan to give the first dose of Rituxan intravenous.  Subsequent weekly doses with the new subcutaneous formulation Rituxan Hycela.  Treatment plan discussed at length with the patient today.  I will repeat hepatitis B serology.  She was negative prior to Rituxan treatment in the past. We discussed the main potential side effects of Rituxan being allergic reactions and reactivation of infection, primarily hepatitis B.    CC: Patient Care Team: Sofie Hartigan, MD as PCP - General  (Family Medicine) Annia Belt, MD as Consulting Physician (Oncology) Heath Lark, MD as Consulting Physician (Hematology and Oncology)   Murriel Hopper, MD, [place]  Hematology-Oncology/Internal Medicine     9/10/201910:20 AM

## 2018-06-22 NOTE — Addendum Note (Signed)
Addended by: Truddie Crumble on: 06/22/2018 08:57 AM   Modules accepted: Orders

## 2018-06-22 NOTE — Telephone Encounter (Signed)
Appt for Rituxan IV scheduled 9/18 Wed @ 0800 AM at [place] . Called pt - no answer; mailbox full, unable to leave message. I will try again.

## 2018-06-23 LAB — KAPPA/LAMBDA LIGHT CHAINS
IG LAMBDA FREE LIGHT CHAIN: 12 mg/L (ref 5.7–26.3)
Ig Kappa Free Light Chain: 19.5 mg/L — ABNORMAL HIGH (ref 3.3–19.4)
Kappa/Lambda FluidC Ratio: 1.63 (ref 0.26–1.65)

## 2018-06-23 LAB — IMMUNOFIXATION ELECTROPHORESIS
IGA/IMMUNOGLOBULIN A, SERUM: 154 mg/dL (ref 87–352)
IgG (Immunoglobin G), Serum: 544 mg/dL — ABNORMAL LOW (ref 700–1600)
IgM (Immunoglobulin M), Srm: 411 mg/dL — ABNORMAL HIGH (ref 26–217)
TOTAL PROTEIN: 6.2 g/dL (ref 6.0–8.5)

## 2018-06-23 LAB — BETA 2 MICROGLOBULIN, SERUM: Beta-2: 2.1 mg/L (ref 0.6–2.4)

## 2018-06-23 LAB — HEPATITIS B SURFACE ANTIGEN: Hepatitis B Surface Ag: NEGATIVE

## 2018-06-23 NOTE — Telephone Encounter (Signed)
Noted. Thanks.

## 2018-06-23 NOTE — Telephone Encounter (Signed)
Stated she prefer a Friday appt b/c her husband usually off on Fridays. She will call short stay to re-schedule; phone # given to her. Also instructed to register at Admissions 10 mins prior  Appt. Informed Dr Darnell Level refilled prednisone and protonix; stated she has plenty of prednisone.  Appt changed to Friday 9/20 @ 0800 AM per Epic.

## 2018-06-24 ENCOUNTER — Telehealth: Payer: Self-pay | Admitting: *Deleted

## 2018-06-24 NOTE — Telephone Encounter (Signed)
Call made pt insurance to obtain prior authorization for cpt J9310 (Rituximab injection)-no PA required.[name]9/12/20194:44 PM

## 2018-06-29 ENCOUNTER — Encounter (HOSPITAL_COMMUNITY): Payer: Self-pay | Admitting: Oncology

## 2018-06-30 ENCOUNTER — Encounter (HOSPITAL_COMMUNITY): Payer: Commercial Managed Care - PPO

## 2018-07-02 ENCOUNTER — Ambulatory Visit (HOSPITAL_COMMUNITY)
Admission: RE | Admit: 2018-07-02 | Discharge: 2018-07-02 | Disposition: A | Discharge status: Home / Self Care | Payer: Commercial Managed Care - PPO | Source: Ambulatory Visit | Attending: Oncology | Admitting: Oncology

## 2018-07-02 DIAGNOSIS — C8307 Small cell B-cell lymphoma, spleen: Secondary | ICD-10-CM | POA: Insufficient documentation

## 2018-07-02 DIAGNOSIS — D591 Autoimmune hemolytic anemia, unspecified: Secondary | ICD-10-CM

## 2018-07-02 MED ORDER — METHYLPREDNISOLONE SODIUM SUCC 40 MG IJ SOLR
40.0000 mg | Freq: Once | INTRAMUSCULAR | Status: AC
  Administered 2018-07-02: 40 mg via INTRAVENOUS

## 2018-07-02 MED ORDER — LORAZEPAM 2 MG/ML IJ SOLN
0.5000 mg | Freq: Once | INTRAMUSCULAR | Status: AC
  Administered 2018-07-02: 0.5 mg via INTRAVENOUS
  Filled 2018-07-02: qty 1

## 2018-07-02 MED ORDER — ACETAMINOPHEN 325 MG PO TABS
ORAL_TABLET | ORAL | Status: AC
  Filled 2018-07-02: qty 2

## 2018-07-02 MED ORDER — SODIUM CHLORIDE 0.9 % IV SOLN
375.0000 mg/m2 | Freq: Once | INTRAVENOUS | Status: AC
  Administered 2018-07-02: 800 mg via INTRAVENOUS
  Filled 2018-07-02: qty 80

## 2018-07-02 MED ORDER — DIPHENHYDRAMINE HCL 25 MG PO CAPS
50.0000 mg | ORAL_CAPSULE | Freq: Once | ORAL | Status: AC
  Administered 2018-07-02: 50 mg via ORAL

## 2018-07-02 MED ORDER — DIPHENHYDRAMINE HCL 25 MG PO CAPS
ORAL_CAPSULE | ORAL | Status: AC
  Filled 2018-07-02: qty 2

## 2018-07-02 MED ORDER — ACETAMINOPHEN 325 MG PO TABS
650.0000 mg | ORAL_TABLET | Freq: Once | ORAL | Status: AC
  Administered 2018-07-02: 650 mg via ORAL

## 2018-07-02 MED ORDER — METHYLPREDNISOLONE SODIUM SUCC 40 MG IJ SOLR
INTRAMUSCULAR | Status: AC
  Administered 2018-07-02: 40 mg via INTRAVENOUS
  Filled 2018-07-02: qty 1

## 2018-07-02 NOTE — Progress Notes (Signed)
Pt completed rituxan infusion without further complaint.

## 2018-07-02 NOTE — Progress Notes (Signed)
Called to room and pt complaining of chills, back pain, and was crying. She denied shortness of breath, chest pain, itching. I stopped the ritixan infusion and called Dr Beryle Beams.  Orders received to give solumedrol and ativan IV, wait 30 minutes and restart the infusion.

## 2018-07-05 ENCOUNTER — Encounter: Payer: Self-pay | Admitting: Oncology

## 2018-07-06 ENCOUNTER — Other Ambulatory Visit: Payer: Self-pay | Admitting: Oncology

## 2018-07-06 DIAGNOSIS — R768 Other specified abnormal immunological findings in serum: Secondary | ICD-10-CM

## 2018-07-06 DIAGNOSIS — C8307 Small cell B-cell lymphoma, spleen: Secondary | ICD-10-CM

## 2018-07-06 DIAGNOSIS — D591 Autoimmune hemolytic anemia, unspecified: Secondary | ICD-10-CM

## 2018-07-09 ENCOUNTER — Ambulatory Visit (HOSPITAL_COMMUNITY)
Admission: RE | Admit: 2018-07-09 | Discharge: 2018-07-09 | Disposition: A | Discharge status: Home / Self Care | Payer: Commercial Managed Care - PPO | Source: Ambulatory Visit | Attending: Oncology | Admitting: Oncology

## 2018-07-09 DIAGNOSIS — D591 Autoimmune hemolytic anemia, unspecified: Secondary | ICD-10-CM

## 2018-07-09 DIAGNOSIS — C8307 Small cell B-cell lymphoma, spleen: Secondary | ICD-10-CM | POA: Insufficient documentation

## 2018-07-09 MED ORDER — ACETAMINOPHEN 325 MG PO TABS
ORAL_TABLET | ORAL | Status: AC
  Filled 2018-07-09: qty 2

## 2018-07-09 MED ORDER — ACETAMINOPHEN 325 MG PO TABS
650.0000 mg | ORAL_TABLET | ORAL | Status: DC
  Administered 2018-07-09: 650 mg via ORAL

## 2018-07-09 MED ORDER — DIPHENHYDRAMINE HCL 25 MG PO CAPS
ORAL_CAPSULE | ORAL | Status: AC
  Filled 2018-07-09: qty 2

## 2018-07-09 MED ORDER — DIPHENHYDRAMINE HCL 25 MG PO CAPS
50.0000 mg | ORAL_CAPSULE | ORAL | Status: DC
  Administered 2018-07-09: 50 mg via ORAL

## 2018-07-09 MED ORDER — RITUXIMAB-HYALURONIDASE HUMAN 1400-23400 MG -UT/11.7ML ~~LOC~~ SOLN
1400.0000 mg | SUBCUTANEOUS | Status: DC
  Administered 2018-07-09: 1400 mg via SUBCUTANEOUS
  Filled 2018-07-09: qty 11.7

## 2018-07-13 ENCOUNTER — Encounter: Payer: Self-pay | Admitting: Oncology

## 2018-07-13 NOTE — Progress Notes (Signed)
47 year old woman who just resumed treatment with single agent Rituxan for early relapse of marginal zone lymphoma after a 4-year remission.  Initial treatment with cladribine chemotherapy plus Rituxan. She received the first dose of Rituxan intravenous.  She developed rigors.  She was able to complete the dose with administration of Benadryl and steroids and a slower rate of infusion. The second dose was given subcutaneous with the new Rituxan hyaluronidase preparation on Friday September 27th.  She had no immediate reaction but called me Saturday morning.  She developed a fist sized area of erythema and induration at the injection site.  I advised her to boost her current dose of steroids up from 10 mg to 40 mg and continue to take intermittent Benadryl or a nonsedating antihistamine that she had on hand. She called back today.  She did have progressive extension of the reaction to involve a greater area.  Rash currently stable.  Erythema fading.  I instructed her to take a picture with her cell phone.  Impression: Grade 3 hypersensitivity reaction to subcutaneous Rituxan Hycela. I will have to discontinue this product and go back to parenteral Rituxan for her next 2 scheduled doses.

## 2018-07-14 ENCOUNTER — Encounter: Payer: Self-pay | Admitting: Oncology

## 2018-07-15 ENCOUNTER — Other Ambulatory Visit: Payer: Self-pay | Admitting: Oncology

## 2018-07-15 DIAGNOSIS — C8307 Small cell B-cell lymphoma, spleen: Secondary | ICD-10-CM

## 2018-07-16 ENCOUNTER — Other Ambulatory Visit: Payer: Self-pay | Admitting: Oncology

## 2018-07-16 ENCOUNTER — Encounter (HOSPITAL_COMMUNITY): Payer: Commercial Managed Care - PPO

## 2018-07-19 ENCOUNTER — Ambulatory Visit (HOSPITAL_COMMUNITY)
Admission: RE | Admit: 2018-07-19 | Discharge: 2018-07-19 | Disposition: A | Discharge status: Home / Self Care | Payer: Commercial Managed Care - PPO | Source: Ambulatory Visit | Attending: Oncology | Admitting: Oncology

## 2018-07-19 ENCOUNTER — Other Ambulatory Visit: Payer: Self-pay | Admitting: Oncology

## 2018-07-19 MED ORDER — AZITHROMYCIN 250 MG PO TABS: ORAL_TABLET | ORAL | 0 refills | Status: AC

## 2018-07-19 NOTE — Progress Notes (Signed)
Pt here for IV Rituxan but reports cold symptoms that started yesterday morning. VSS, no fever. Dr. Beryle Beams called and informed. Per him, hold Rituxan today, resume next Monday and to call in [place] to pt's pharmacy.

## 2018-07-22 ENCOUNTER — Encounter: Payer: Self-pay | Admitting: Oncology

## 2018-07-23 ENCOUNTER — Encounter (HOSPITAL_COMMUNITY): Payer: Commercial Managed Care - PPO

## 2018-07-26 ENCOUNTER — Ambulatory Visit (HOSPITAL_COMMUNITY)
Admission: RE | Admit: 2018-07-26 | Discharge: 2018-07-26 | Disposition: A | Discharge status: Home / Self Care | Payer: Commercial Managed Care - PPO | Source: Ambulatory Visit | Attending: Oncology | Admitting: Oncology

## 2018-07-26 DIAGNOSIS — C8307 Small cell B-cell lymphoma, spleen: Secondary | ICD-10-CM | POA: Insufficient documentation

## 2018-07-26 MED ORDER — ACETAMINOPHEN 325 MG PO TABS
650.0000 mg | ORAL_TABLET | ORAL | Status: DC
  Administered 2018-07-26: 650 mg via ORAL

## 2018-07-26 MED ORDER — ACETAMINOPHEN 325 MG PO TABS
ORAL_TABLET | ORAL | Status: AC
  Administered 2018-07-26: 650 mg via ORAL
  Filled 2018-07-26: qty 2

## 2018-07-26 MED ORDER — METHYLPREDNISOLONE SODIUM SUCC 125 MG IJ SOLR
60.0000 mg | INTRAMUSCULAR | Status: DC
  Administered 2018-07-26: 60 mg via INTRAVENOUS

## 2018-07-26 MED ORDER — DIPHENHYDRAMINE HCL 25 MG PO CAPS
50.0000 mg | ORAL_CAPSULE | ORAL | Status: DC
  Administered 2018-07-26: 50 mg via ORAL

## 2018-07-26 MED ORDER — DIPHENHYDRAMINE HCL 25 MG PO CAPS
ORAL_CAPSULE | ORAL | Status: AC
  Administered 2018-07-26: 50 mg via ORAL
  Filled 2018-07-26: qty 2

## 2018-07-26 MED ORDER — METHYLPREDNISOLONE SODIUM SUCC 125 MG IJ SOLR
INTRAMUSCULAR | Status: AC
  Administered 2018-07-26: 60 mg via INTRAVENOUS
  Filled 2018-07-26: qty 2

## 2018-07-26 MED ORDER — SODIUM CHLORIDE 0.9 % IV SOLN
375.0000 mg/m2 | INTRAVENOUS | Status: DC
  Administered 2018-07-26: 800 mg via INTRAVENOUS
  Filled 2018-07-26: qty 80

## 2018-07-26 NOTE — Progress Notes (Signed)
Pt reports no TB screening in past 12 years.  Dr Beryle Beams notified.  Ok to proceed with rituxan infusion

## 2018-07-27 ENCOUNTER — Encounter: Payer: Self-pay | Admitting: Oncology

## 2018-08-02 ENCOUNTER — Inpatient Hospital Stay (HOSPITAL_COMMUNITY): Admission: RE | Admit: 2018-08-02 | Payer: Commercial Managed Care - PPO | Source: Ambulatory Visit

## 2018-08-09 ENCOUNTER — Ambulatory Visit (HOSPITAL_COMMUNITY)
Admission: RE | Admit: 2018-08-09 | Discharge: 2018-08-09 | Disposition: A | Discharge status: Home / Self Care | Payer: Commercial Managed Care - PPO | Source: Ambulatory Visit | Attending: Oncology | Admitting: Oncology

## 2018-08-09 DIAGNOSIS — C8307 Small cell B-cell lymphoma, spleen: Secondary | ICD-10-CM | POA: Insufficient documentation

## 2018-08-09 DIAGNOSIS — D591 Autoimmune hemolytic anemia, unspecified: Secondary | ICD-10-CM

## 2018-08-09 MED ORDER — DIPHENHYDRAMINE HCL 25 MG PO CAPS
50.0000 mg | ORAL_CAPSULE | ORAL | Status: DC
  Administered 2018-08-09: 50 mg via ORAL

## 2018-08-09 MED ORDER — METHYLPREDNISOLONE SODIUM SUCC 125 MG IJ SOLR
INTRAMUSCULAR | Status: AC
  Filled 2018-08-09: qty 2

## 2018-08-09 MED ORDER — ACETAMINOPHEN 325 MG PO TABS
ORAL_TABLET | ORAL | Status: AC
  Filled 2018-08-09: qty 2

## 2018-08-09 MED ORDER — DIPHENHYDRAMINE HCL 25 MG PO CAPS
ORAL_CAPSULE | ORAL | Status: AC
  Filled 2018-08-09: qty 2

## 2018-08-09 MED ORDER — ACETAMINOPHEN 325 MG PO TABS
650.0000 mg | ORAL_TABLET | ORAL | Status: DC
  Administered 2018-08-09: 650 mg via ORAL

## 2018-08-09 MED ORDER — SODIUM CHLORIDE 0.9 % IV SOLN
375.0000 mg/m2 | INTRAVENOUS | Status: AC
  Administered 2018-08-09: 800 mg via INTRAVENOUS
  Filled 2018-08-09: qty 80

## 2018-08-09 MED ORDER — METHYLPREDNISOLONE SODIUM SUCC 125 MG IJ SOLR
60.0000 mg | INTRAMUSCULAR | Status: AC
  Administered 2018-08-09: 60 mg via INTRAVENOUS

## 2018-08-10 ENCOUNTER — Encounter: Payer: Self-pay | Admitting: Oncology

## 2018-08-11 ENCOUNTER — Other Ambulatory Visit: Payer: Self-pay | Admitting: Oncology

## 2018-08-11 DIAGNOSIS — C8307 Small cell B-cell lymphoma, spleen: Secondary | ICD-10-CM

## 2018-08-11 DIAGNOSIS — D591 Autoimmune hemolytic anemia, unspecified: Secondary | ICD-10-CM

## 2018-08-23 ENCOUNTER — Other Ambulatory Visit: Payer: Commercial Managed Care - PPO

## 2018-08-24 ENCOUNTER — Other Ambulatory Visit (INDEPENDENT_AMBULATORY_CARE_PROVIDER_SITE_OTHER): Payer: Commercial Managed Care - PPO

## 2018-08-24 DIAGNOSIS — D591 Autoimmune hemolytic anemia, unspecified: Secondary | ICD-10-CM

## 2018-08-24 DIAGNOSIS — C8307 Small cell B-cell lymphoma, spleen: Secondary | ICD-10-CM | POA: Diagnosis not present

## 2018-08-25 ENCOUNTER — Telehealth: Payer: Self-pay | Admitting: *Deleted

## 2018-08-25 NOTE — Telephone Encounter (Signed)
-----   Message from Annia Belt, MD sent at 08/25/2018  7:57 AM EST ----- Call pt: WBC & platelets back to normal; Hb stable at 12; IgM back in normal range at 198, down from 411

## 2018-08-25 NOTE — Telephone Encounter (Signed)
Called pt - no answer; mailbox full, unable to leave a message.

## 2018-08-26 ENCOUNTER — Other Ambulatory Visit: Payer: Self-pay | Admitting: Oncology

## 2018-08-26 ENCOUNTER — Encounter: Payer: Self-pay | Admitting: Oncology

## 2018-08-26 DIAGNOSIS — D591 Autoimmune hemolytic anemia, unspecified: Secondary | ICD-10-CM

## 2018-08-26 DIAGNOSIS — C8307 Small cell B-cell lymphoma, spleen: Secondary | ICD-10-CM

## 2018-08-26 MED ORDER — PREDNISONE 5 MG PO TABS: ORAL_TABLET | ORAL | 0 refills | Status: DC

## 2018-08-26 NOTE — Telephone Encounter (Signed)
1. I will send Rx for 5 mg tabs to taper: alternate 10 mg dailt with 5 mg for 8 days, then 5 mg daily for 8 days, then 5 mg every other day for 8 days then stop 2. She will get at least 8 more treatments, 1 every 2 months, not 4 in a row each time. [place] - Please check w short stay to schedule next treatment approximately 2 months after last Rx given. Thanks DrG

## 2018-08-26 NOTE — Telephone Encounter (Signed)
Pt called / informed "WBC & platelets back to normal; Hb stable at 12; IgM back in normal range at 198, down from 411" per Dr Beryle Beams.  She wants to know should she stay on 10 mg of Prednisone? And how many treatments will she have; next appt scheduled 12/23. Thanks

## 2018-08-26 NOTE — Telephone Encounter (Signed)
1. Re prednisone: I will send in Rx for 5 mg tabs: alternate 10 mg daily with 5 mg daily for 8 days, then 5 mg daily for 8 days then 5 mg every other day for 8 days, then stop. 2. Re # of treatments: at least

## 2018-08-27 LAB — COMPREHENSIVE METABOLIC PANEL
ALBUMIN: 4.2 g/dL (ref 3.5–5.5)
ALK PHOS: 52 IU/L (ref 39–117)
ALT: 46 IU/L — ABNORMAL HIGH (ref 0–32)
AST: 15 IU/L (ref 0–40)
Albumin/Globulin Ratio: 2.3 — ABNORMAL HIGH (ref 1.2–2.2)
BUN / CREAT RATIO: 18 (ref 9–23)
BUN: 16 mg/dL (ref 6–24)
Bilirubin Total: 0.8 mg/dL (ref 0.0–1.2)
CO2: 23 mmol/L (ref 20–29)
CREATININE: 0.89 mg/dL (ref 0.57–1.00)
Calcium: 9 mg/dL (ref 8.7–10.2)
Chloride: 101 mmol/L (ref 96–106)
GFR calc Af Amer: 89 mL/min/{1.73_m2} (ref >59)
GFR, EST NON AFRICAN AMERICAN: 77 mL/min/{1.73_m2} (ref >59)
GLOBULIN, TOTAL: 1.8 g/dL (ref 1.5–4.5)
GLUCOSE: 117 mg/dL — AB (ref 65–99)
Potassium: 3.7 mmol/L (ref 3.5–5.2)
SODIUM: 140 mmol/L (ref 134–144)

## 2018-08-27 LAB — IMMUNOFIXATION ELECTROPHORESIS
IGA/IMMUNOGLOBULIN A, SERUM: 141 mg/dL (ref 87–352)
IgG (Immunoglobin G), Serum: 515 mg/dL — ABNORMAL LOW (ref 700–1600)
IgM (Immunoglobulin M), Srm: 198 mg/dL (ref 26–217)
TOTAL PROTEIN: 6 g/dL (ref 6.0–8.5)

## 2018-08-27 LAB — CBC WITH DIFFERENTIAL/PLATELET
Basophils Absolute: 0 10*3/uL (ref 0.0–0.2)
Basos: 0 %
EOS (ABSOLUTE): 0.1 10*3/uL (ref 0.0–0.4)
EOS: 1 %
HEMATOCRIT: 36 % (ref 34.0–46.6)
HEMOGLOBIN: 12.2 g/dL (ref 11.1–15.9)
IMMATURE GRANULOCYTES: 0 %
Immature Grans (Abs): 0 10*3/uL (ref 0.0–0.1)
Lymphocytes Absolute: 1 10*3/uL (ref 0.7–3.1)
Lymphs: 16 %
MCH: 30.8 pg (ref 26.6–33.0)
MCHC: 33.9 g/dL (ref 31.5–35.7)
MCV: 91 fL (ref 79–97)
Monocytes Absolute: 0.3 10*3/uL (ref 0.1–0.9)
Monocytes: 6 %
NEUTROS PCT: 77 %
Neutrophils Absolute: 4.7 10*3/uL (ref 1.4–7.0)
Platelets: 184 10*3/uL (ref 150–450)
RBC: 3.96 x10E6/uL (ref 3.77–5.28)
RDW: 15.4 % (ref 12.3–15.4)
WBC: 6.1 10*3/uL (ref 3.4–10.8)

## 2018-08-27 LAB — LACTATE DEHYDROGENASE: LDH: 286 IU/L — ABNORMAL HIGH (ref 119–226)

## 2018-08-27 LAB — RETICULOCYTES: RETIC CT PCT: 3.9 % — AB (ref 0.6–2.6)

## 2018-08-27 NOTE — Telephone Encounter (Signed)
See MyChart message

## 2018-09-15 ENCOUNTER — Encounter: Payer: Self-pay | Admitting: Oncology

## 2018-09-21 ENCOUNTER — Other Ambulatory Visit: Payer: Self-pay | Admitting: Oncology

## 2018-09-21 ENCOUNTER — Encounter: Payer: Self-pay | Admitting: Oncology

## 2018-09-21 MED ORDER — AZITHROMYCIN 250 MG PO TABS: ORAL_TABLET | ORAL | 1 refills | Status: DC

## 2018-10-01 ENCOUNTER — Encounter: Payer: Self-pay | Admitting: Oncology

## 2018-10-01 ENCOUNTER — Other Ambulatory Visit: Payer: Self-pay | Admitting: Oncology

## 2018-10-04 ENCOUNTER — Ambulatory Visit (HOSPITAL_COMMUNITY)
Admission: RE | Admit: 2018-10-04 | Discharge: 2018-10-04 | Disposition: A | Discharge status: Home / Self Care | Payer: Commercial Managed Care - PPO | Source: Ambulatory Visit | Attending: Oncology | Admitting: Oncology

## 2018-10-04 DIAGNOSIS — C8307 Small cell B-cell lymphoma, spleen: Secondary | ICD-10-CM

## 2018-10-04 DIAGNOSIS — D591 Autoimmune hemolytic anemia, unspecified: Secondary | ICD-10-CM

## 2018-10-04 MED ORDER — SODIUM CHLORIDE 0.9 % IV SOLN
375.0000 mg/m2 | Freq: Once | INTRAVENOUS | Status: AC
  Administered 2018-10-04: 800 mg via INTRAVENOUS
  Filled 2018-10-04: qty 80

## 2018-10-04 MED ORDER — DIPHENHYDRAMINE HCL 25 MG PO CAPS
ORAL_CAPSULE | ORAL | Status: AC
  Administered 2018-10-04: 50 mg via ORAL
  Filled 2018-10-04: qty 2

## 2018-10-04 MED ORDER — METHYLPREDNISOLONE SODIUM SUCC 125 MG IJ SOLR
INTRAMUSCULAR | Status: AC
  Administered 2018-10-04: 60 mg via INTRAVENOUS
  Filled 2018-10-04: qty 2

## 2018-10-04 MED ORDER — ACETAMINOPHEN 325 MG PO TABS
650.0000 mg | ORAL_TABLET | Freq: Once | ORAL | Status: AC
  Administered 2018-10-04: 650 mg via ORAL

## 2018-10-04 MED ORDER — DIPHENHYDRAMINE HCL 25 MG PO CAPS
50.0000 mg | ORAL_CAPSULE | Freq: Once | ORAL | Status: AC
  Administered 2018-10-04: 50 mg via ORAL

## 2018-10-04 MED ORDER — ACETAMINOPHEN 325 MG PO TABS
ORAL_TABLET | ORAL | Status: AC
  Administered 2018-10-04: 650 mg via ORAL
  Filled 2018-10-04: qty 2

## 2018-10-04 MED ORDER — METHYLPREDNISOLONE SODIUM SUCC 125 MG IJ SOLR
60.0000 mg | Freq: Once | INTRAMUSCULAR | Status: AC
  Administered 2018-10-04: 60 mg via INTRAVENOUS

## 2018-10-12 ENCOUNTER — Encounter: Payer: Self-pay | Admitting: Oncology

## 2018-10-14 ENCOUNTER — Other Ambulatory Visit: Payer: Self-pay | Admitting: Oncology

## 2018-10-14 DIAGNOSIS — D472 Monoclonal gammopathy: Secondary | ICD-10-CM

## 2018-10-14 DIAGNOSIS — D591 Autoimmune hemolytic anemia, unspecified: Secondary | ICD-10-CM

## 2018-10-14 DIAGNOSIS — C8307 Small cell B-cell lymphoma, spleen: Secondary | ICD-10-CM

## 2018-10-14 DIAGNOSIS — R768 Other specified abnormal immunological findings in serum: Secondary | ICD-10-CM

## 2018-10-18 ENCOUNTER — Encounter: Payer: Self-pay | Admitting: Oncology

## 2018-10-18 ENCOUNTER — Other Ambulatory Visit (INDEPENDENT_AMBULATORY_CARE_PROVIDER_SITE_OTHER): Payer: Commercial Managed Care - PPO

## 2018-10-18 DIAGNOSIS — R768 Other specified abnormal immunological findings in serum: Secondary | ICD-10-CM

## 2018-10-18 DIAGNOSIS — D591 Autoimmune hemolytic anemia, unspecified: Secondary | ICD-10-CM

## 2018-10-18 DIAGNOSIS — C8307 Small cell B-cell lymphoma, spleen: Secondary | ICD-10-CM

## 2018-10-18 DIAGNOSIS — D472 Monoclonal gammopathy: Secondary | ICD-10-CM

## 2018-10-19 ENCOUNTER — Telehealth: Payer: Self-pay | Admitting: *Deleted

## 2018-10-19 ENCOUNTER — Encounter: Payer: Self-pay | Admitting: Oncology

## 2018-10-19 LAB — RETICULOCYTES: Retic Ct Pct: 4 % — ABNORMAL HIGH (ref 0.6–2.6)

## 2018-10-19 LAB — COMPREHENSIVE METABOLIC PANEL
ALT: 89 IU/L — ABNORMAL HIGH (ref 0–32)
AST: 40 IU/L (ref 0–40)
Albumin/Globulin Ratio: 2.5 — ABNORMAL HIGH (ref 1.2–2.2)
Albumin: 4 g/dL (ref 3.5–5.5)
Alkaline Phosphatase: 55 IU/L (ref 39–117)
BILIRUBIN TOTAL: 1 mg/dL (ref 0.0–1.2)
BUN/Creatinine Ratio: 13 (ref 9–23)
BUN: 11 mg/dL (ref 6–24)
CO2: 21 mmol/L (ref 20–29)
Calcium: 9 mg/dL (ref 8.7–10.2)
Chloride: 103 mmol/L (ref 96–106)
Creatinine, Ser: 0.83 mg/dL (ref 0.57–1.00)
GFR calc Af Amer: 97 mL/min/{1.73_m2} (ref >59)
GFR calc non Af Amer: 84 mL/min/{1.73_m2} (ref >59)
Globulin, Total: 1.6 g/dL (ref 1.5–4.5)
Glucose: 106 mg/dL — ABNORMAL HIGH (ref 65–99)
POTASSIUM: 4.1 mmol/L (ref 3.5–5.2)
Sodium: 142 mmol/L (ref 134–144)

## 2018-10-19 LAB — CBC WITH DIFFERENTIAL/PLATELET
BASOS ABS: 0 10*3/uL (ref 0.0–0.2)
Basos: 1 %
EOS (ABSOLUTE): 0.1 10*3/uL (ref 0.0–0.4)
EOS: 3 %
Hematocrit: 34.3 % (ref 34.0–46.6)
Hemoglobin: 11.8 g/dL (ref 11.1–15.9)
Immature Grans (Abs): 0 10*3/uL (ref 0.0–0.1)
Immature Granulocytes: 0 %
Lymphocytes Absolute: 0.7 10*3/uL (ref 0.7–3.1)
Lymphs: 17 %
MCH: 31.1 pg (ref 26.6–33.0)
MCHC: 34.4 g/dL (ref 31.5–35.7)
MCV: 91 fL (ref 79–97)
Monocytes Absolute: 0.3 10*3/uL (ref 0.1–0.9)
Monocytes: 8 %
NEUTROS PCT: 71 %
Neutrophils Absolute: 2.9 10*3/uL (ref 1.4–7.0)
Platelets: 203 10*3/uL (ref 150–450)
RBC: 3.79 x10E6/uL (ref 3.77–5.28)
RDW: 13.8 % (ref 11.7–15.4)
WBC: 4 10*3/uL (ref 3.4–10.8)

## 2018-10-19 LAB — IMMUNOFIXATION ELECTROPHORESIS
IgA/Immunoglobulin A, Serum: 133 mg/dL (ref 87–352)
IgG (Immunoglobin G), Serum: 542 mg/dL — ABNORMAL LOW (ref 700–1600)
IgM (Immunoglobulin M), Srm: 62 mg/dL (ref 26–217)
TOTAL PROTEIN: 5.6 g/dL — AB (ref 6.0–8.5)

## 2018-10-19 LAB — LACTATE DEHYDROGENASE: LDH: 195 IU/L (ref 119–226)

## 2018-10-19 LAB — DIRECT ANTIGLOBULIN TEST (NOT AT ARMC): Coombs', Direct: NEGATIVE

## 2018-10-19 NOTE — Telephone Encounter (Signed)
My Chart message sent

## 2018-10-19 NOTE — Telephone Encounter (Signed)
-----   Message from Annia Belt, MD sent at 10/19/2018 11:00 AM EST ----- Call pt> Lab looks great! IgM now low normal. Hb stable @ 11.8. WBC & platelets back to normal

## 2018-11-02 ENCOUNTER — Encounter: Payer: Self-pay | Admitting: Oncology

## 2018-11-03 ENCOUNTER — Encounter: Payer: Self-pay | Admitting: *Deleted

## 2018-11-08 ENCOUNTER — Encounter: Payer: Self-pay | Admitting: Oncology

## 2018-11-09 ENCOUNTER — Encounter: Payer: Self-pay | Admitting: Oncology

## 2018-11-09 ENCOUNTER — Ambulatory Visit (INDEPENDENT_AMBULATORY_CARE_PROVIDER_SITE_OTHER): Payer: Commercial Managed Care - PPO | Admitting: Oncology

## 2018-11-09 ENCOUNTER — Other Ambulatory Visit: Payer: Self-pay

## 2018-11-09 VITALS — BP 156/73 | HR 78 | Temp 98.0°F | Ht 67.0 in | Wt 222.3 lb

## 2018-11-09 DIAGNOSIS — D591 Autoimmune hemolytic anemia, unspecified: Secondary | ICD-10-CM

## 2018-11-09 DIAGNOSIS — C8307 Small cell B-cell lymphoma, spleen: Secondary | ICD-10-CM

## 2018-11-09 DIAGNOSIS — D472 Monoclonal gammopathy: Secondary | ICD-10-CM | POA: Diagnosis not present

## 2018-11-09 DIAGNOSIS — Z9221 Personal history of antineoplastic chemotherapy: Secondary | ICD-10-CM

## 2018-11-09 DIAGNOSIS — C884 Extranodal marginal zone B-cell lymphoma of mucosa-associated lymphoid tissue [MALT-lymphoma]: Secondary | ICD-10-CM | POA: Diagnosis not present

## 2018-11-09 DIAGNOSIS — Z8719 Personal history of other diseases of the digestive system: Secondary | ICD-10-CM | POA: Diagnosis not present

## 2018-11-09 NOTE — Progress Notes (Signed)
Hematology and Oncology Follow Up Visit  Kara Perkins [id number] 22-Apr-1971 48 y.o. 11/09/2018 7:48 PM   Principle Diagnosis: 1.  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 2.  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 3.  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  Clinical summary: 48 year old woman with a complicated hematologic history summarized in my recent May 31, 2018 progress note. She has a history of marginal zone, primarily extranodal, spleen dominant,  low-grade B cell non-Hodgkin's lymphoma.  Diagnosis antedated by initial unexplained direct positive Coombs test without hemolysis, then development of a IgM monoclonal gammopathy, with rising IgM paraprotein over time and then development of symptomatic splenomegaly and progressive pancytopenia in 2015.  Treated with a combination of cladribine chemotherapy plus Rituxan with achievement of a complete PET response. Stable until April 2019 when she developed acute onset of Coombs positive hemolytic anemia responsive to steroids but recurring when steroids were tapered and she had to be reinduced.  No adenopathy on exam.  No recurrent splenomegaly clinically.  PET scan done on August 9,2019 which I personally reviewed with the radiologist who read the study, shows spleen activity increased compared with the remission study in 2015 but not higher than liver background on current study.  2 areas of minimal new adenopathy in the right neck and left axilla that had normalized on the prior study.  Diffuse increased bone marrow activity. She had a bone marrow aspiration and biopsy on June 11, 2018.  Results reviewed with hematopathologist.  Currently no evidence for lymphoma involvement on this sample.  No monoclonal B-cell population on flow cytometry.  No lymphoid aggregates.  Marrow hypercellular likely as a response to ongoing hemolysis.  IgM paraprotein began to rise.  Findings in aggregate consistent with early  progression of her lymphoma.  We had a lengthy discussion retreatment options.  I decided on initial therapy with single agent Rituxan based on NCCN guidelines and updated literature search and presentation that are hematology case conference.  We discussed other options.  The one with the most evidence-based data would be use of single agent ibrutinib.  Other combinations are active including combination of lenalidomide plus Rituxan, or obinotuzumab plus lenalidomide. She resumed treatment with Rituxan on July 19, 2018.  After the first IV dose, she was given a subcutaneous dose of the Rituxan Hycela preparation but developed a severe cutaneous reaction on her abdominal wall.  Subsequent doses of Rituxan given IV without incident.  Most recent on October 04, 2018.  Interim History:   Overall doing well.  She is back in a clinical and laboratory remission.  IgM which had risen to 411 mg percent by June 22, 2018, down to 62 mg percent as of October 18, 2018.  Hemolytic anemia has resolved and she was tapered off steroids.  Recent hemoglobin 11.8 g.  Reticulocyte count down from 7% to 4%.  Platelet count which had dropped to 118,000 now up to 203,000.  White count which fell to 2800, rose on steroids, currently at 4000 with 71% neutrophils. She experiences fatigue following the Rituxan administration.  She is also developed some polyarthralgias which I believe is a minor serum sickness reaction to the Rituxan.   Medications: reviewed  Allergies: No Known Allergies  Review of Systems: See interim history Remaining ROS negative:   Physical Exam: Blood pressure (!) 156/73, pulse 78, temperature 98 F (36.7 C), temperature source Oral, height 5\' 7"  (1.702 m), weight 222 lb 4.8 oz (100.8 kg), SpO2 100 %. Wt Readings from  Last 3 Encounters:  11/09/18 222 lb 4.8 oz (100.8 kg)  10/04/18 220 lb (99.8 kg)  08/09/18 215 lb (97.5 kg)     General appearance: Well-nourished Caucasian woman HENNT:  Pharynx no erythema, exudate, mass, or ulcer. No thyromegaly or thyroid nodules Lymph nodes: No cervical, supraclavicular, or axillary lymphadenopathy Breasts: Lungs: Clear to auscultation, resonant to percussion throughout Heart: Regular rhythm, no murmur, no gallop, no rub, no click, no edema Abdomen: Soft, nontender, normal bowel sounds, no mass, no organomegaly Extremities: No edema, no calf tenderness.  Skin of the calves chronically tight bilaterally.  Nontender. Musculoskeletal: no joint deformities GU:  Vascular: Carotid pulses 2+, no bruits, Neurologic: Alert, oriented, PERRLA,, cranial nerves grossly normal, motor strength 5 over 5, reflexes 1+ symmetric, upper body coordination normal, gait normal, Skin: No rash or ecchymosis  Lab Results: CBC W/Diff    Component Value Date/Time   WBC 4.0 10/18/2018 0841   WBC 3.5 (L) 06/22/2018 0901   RBC 3.79 10/18/2018 0841   RBC 4.13 06/22/2018 0901   RBC 4.13 06/22/2018 0901   HGB 11.8 10/18/2018 0841   HGB 11.3 (L) 01/30/2015 0923   HCT 34.3 10/18/2018 0841   HCT 34.9 01/30/2015 0923   PLT 203 10/18/2018 0841   MCV 91 10/18/2018 0841   MCV 81.7 01/30/2015 0923   MCH 31.1 10/18/2018 0841   MCH 31.2 06/22/2018 0901   MCHC 34.4 10/18/2018 0841   MCHC 34.0 06/22/2018 0901   RDW 13.8 10/18/2018 0841   RDW 15.7 (H) 01/30/2015 0923   LYMPHSABS 0.7 10/18/2018 0841   LYMPHSABS 0.3 (L) 01/30/2015 0923   MONOABS 0.4 06/22/2018 0901   MONOABS 0.2 01/30/2015 0923   EOSABS 0.1 10/18/2018 0841   BASOSABS 0.0 10/18/2018 0841   BASOSABS 0.0 01/30/2015 0923     Chemistry      Component Value Date/Time   NA 142 10/18/2018 0841   NA 139 01/30/2015 0924   K 4.1 10/18/2018 0841   K 4.8 01/30/2015 0924   CL 103 10/18/2018 0841   CO2 21 10/18/2018 0841   CO2 25 01/30/2015 0924   BUN 11 10/18/2018 0841   BUN 11.5 01/30/2015 0924   CREATININE 0.83 10/18/2018 0841   CREATININE 0.8 01/30/2015 0924      Component Value Date/Time    CALCIUM 9.0 10/18/2018 0841   CALCIUM 9.1 01/30/2015 0924   ALKPHOS 55 10/18/2018 0841   ALKPHOS 77 01/30/2015 0924   AST 40 10/18/2018 0841   AST 21 01/30/2015 0924   ALT 89 (H) 10/18/2018 0841   ALT 27 01/30/2015 0924   BILITOT 1.0 10/18/2018 0841   BILITOT 0.48 01/30/2015 0924       Radiological Studies: No results found.  Impression:  1.  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  Initial complete PET response to cladribine plus Rituxan which lasted for years. She has now achieved a complete laboratory remission with single agent Rituxan started in October 2019.  I will continue her on a every 28-month schedule. She is aware, and I discussed this with her and her husband today, this is likely not going to be curative but there are number of available treatments which we can move to at sign of further progression. She would like to see me one more time in March.  Subsequently, I will transition her hematology oncology care to Dr. Heath Lark at the [facility] at this time.  2.  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  3.  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  4.  History of ulcerative esophagitis.  CC: Patient Care Team: Sofie Hartigan, MD as PCP - General (Family Medicine) Annia Belt, MD as Consulting Physician (Oncology) Heath Lark, MD as Consulting Physician (Hematology and Oncology)   Murriel Hopper, MD, [place]  Hematology-Oncology/Internal Medicine     1/28/20207:48 PM

## 2018-11-12 ENCOUNTER — Encounter: Payer: Self-pay | Admitting: Hematology and Oncology

## 2018-11-12 ENCOUNTER — Telehealth: Payer: Self-pay | Admitting: Hematology and Oncology

## 2018-11-12 NOTE — Telephone Encounter (Signed)
An appt has been scheduled for the pt to see Dr. Alvy Bimler on 4/20 at 1015am. I wasn't able to reach the pt but her vm is full. Letter mailed.

## 2018-11-16 ENCOUNTER — Encounter: Payer: Self-pay | Admitting: Oncology

## 2018-11-16 ENCOUNTER — Ambulatory Visit: Payer: Commercial Managed Care - PPO | Admitting: Oncology

## 2018-11-17 ENCOUNTER — Other Ambulatory Visit: Payer: Self-pay | Admitting: Oncology

## 2018-11-26 ENCOUNTER — Ambulatory Visit (HOSPITAL_COMMUNITY)
Admission: RE | Admit: 2018-11-26 | Discharge: 2018-11-26 | Disposition: A | Discharge status: Home / Self Care | Payer: Commercial Managed Care - PPO | Source: Ambulatory Visit | Attending: Oncology | Admitting: Oncology

## 2018-11-26 ENCOUNTER — Other Ambulatory Visit: Payer: Self-pay | Admitting: Oncology

## 2018-11-26 DIAGNOSIS — D472 Monoclonal gammopathy: Secondary | ICD-10-CM

## 2018-11-26 DIAGNOSIS — C8307 Small cell B-cell lymphoma, spleen: Secondary | ICD-10-CM | POA: Diagnosis present

## 2018-11-26 DIAGNOSIS — D591 Autoimmune hemolytic anemia, unspecified: Secondary | ICD-10-CM

## 2018-11-26 MED ORDER — METHYLPREDNISOLONE SODIUM SUCC 125 MG IJ SOLR
INTRAMUSCULAR | Status: AC
  Filled 2018-11-26: qty 2

## 2018-11-26 MED ORDER — FUROSEMIDE 20 MG PO TABS: 20.0000 mg | ORAL_TABLET | Freq: Every day | ORAL | 0 refills | Status: DC

## 2018-11-26 MED ORDER — ACETAMINOPHEN 325 MG PO TABS
650.0000 mg | ORAL_TABLET | Freq: Once | ORAL | Status: AC
  Administered 2018-11-26: 650 mg via ORAL

## 2018-11-26 MED ORDER — DIPHENHYDRAMINE HCL 25 MG PO CAPS
ORAL_CAPSULE | ORAL | Status: AC
  Filled 2018-11-26: qty 2

## 2018-11-26 MED ORDER — SODIUM CHLORIDE 0.9 % IV SOLN
375.0000 mg/m2 | Freq: Once | INTRAVENOUS | Status: AC
  Administered 2018-11-26: 800 mg via INTRAVENOUS
  Filled 2018-11-26: qty 80

## 2018-11-26 MED ORDER — METHYLPREDNISOLONE SODIUM SUCC 125 MG IJ SOLR
60.0000 mg | Freq: Once | INTRAMUSCULAR | Status: AC
  Administered 2018-11-26: 60 mg via INTRAVENOUS

## 2018-11-26 MED ORDER — ACETAMINOPHEN 325 MG PO TABS
ORAL_TABLET | ORAL | Status: AC
  Filled 2018-11-26: qty 2

## 2018-11-26 MED ORDER — DIPHENHYDRAMINE HCL 25 MG PO CAPS
50.0000 mg | ORAL_CAPSULE | Freq: Once | ORAL | Status: AC
  Administered 2018-11-26: 50 mg via ORAL

## 2018-11-26 MED ORDER — POTASSIUM CHLORIDE ER 20 MEQ PO TBCR: 20.0000 meq | EXTENDED_RELEASE_TABLET | Freq: Every day | ORAL | 0 refills | Status: DC

## 2018-11-27 ENCOUNTER — Other Ambulatory Visit: Payer: Self-pay | Admitting: Oncology

## 2018-11-29 ENCOUNTER — Encounter (HOSPITAL_COMMUNITY): Payer: Commercial Managed Care - PPO

## 2018-12-02 ENCOUNTER — Encounter: Payer: Self-pay | Admitting: Hematology and Oncology

## 2018-12-06 ENCOUNTER — Encounter: Payer: Self-pay | Admitting: Oncology

## 2018-12-07 ENCOUNTER — Other Ambulatory Visit: Payer: Self-pay | Admitting: Oncology

## 2018-12-07 ENCOUNTER — Telehealth: Payer: Self-pay

## 2018-12-07 DIAGNOSIS — C8307 Small cell B-cell lymphoma, spleen: Secondary | ICD-10-CM

## 2018-12-07 DIAGNOSIS — D508 Other iron deficiency anemias: Secondary | ICD-10-CM

## 2018-12-07 DIAGNOSIS — D591 Autoimmune hemolytic anemia, unspecified: Secondary | ICD-10-CM

## 2018-12-07 DIAGNOSIS — D472 Monoclonal gammopathy: Secondary | ICD-10-CM

## 2018-12-07 NOTE — Telephone Encounter (Signed)
Called regarding upcoming appt with Dr. Alvy Bimler on 3/19. She has appt also scheduled with Dr. Beryle Beams on 3/17 on the same week. She is going to call abd cancel appt with Dr. Beryle Beams on 3/17, she just has not had time to cancel.  Reviewed upcoming appts. She verbalized understanding.

## 2018-12-08 ENCOUNTER — Other Ambulatory Visit (INDEPENDENT_AMBULATORY_CARE_PROVIDER_SITE_OTHER): Payer: Commercial Managed Care - PPO

## 2018-12-08 DIAGNOSIS — Z79899 Other long term (current) drug therapy: Secondary | ICD-10-CM

## 2018-12-08 DIAGNOSIS — D472 Monoclonal gammopathy: Secondary | ICD-10-CM | POA: Diagnosis not present

## 2018-12-08 DIAGNOSIS — Z5181 Encounter for therapeutic drug level monitoring: Secondary | ICD-10-CM

## 2018-12-08 DIAGNOSIS — D591 Autoimmune hemolytic anemia, unspecified: Secondary | ICD-10-CM

## 2018-12-08 DIAGNOSIS — D508 Other iron deficiency anemias: Secondary | ICD-10-CM | POA: Diagnosis not present

## 2018-12-08 DIAGNOSIS — R7989 Other specified abnormal findings of blood chemistry: Secondary | ICD-10-CM

## 2018-12-08 DIAGNOSIS — C8307 Small cell B-cell lymphoma, spleen: Secondary | ICD-10-CM

## 2018-12-08 DIAGNOSIS — R945 Abnormal results of liver function studies: Secondary | ICD-10-CM

## 2018-12-08 DIAGNOSIS — Z79620 Long term (current) use of immunosuppressive biologic: Secondary | ICD-10-CM

## 2018-12-09 ENCOUNTER — Other Ambulatory Visit: Payer: Self-pay | Admitting: Oncology

## 2018-12-09 DIAGNOSIS — Z79899 Other long term (current) drug therapy: Secondary | ICD-10-CM

## 2018-12-09 DIAGNOSIS — R7989 Other specified abnormal findings of blood chemistry: Secondary | ICD-10-CM

## 2018-12-09 DIAGNOSIS — Z5181 Encounter for therapeutic drug level monitoring: Secondary | ICD-10-CM

## 2018-12-09 DIAGNOSIS — R945 Abnormal results of liver function studies: Secondary | ICD-10-CM

## 2018-12-09 DIAGNOSIS — C8307 Small cell B-cell lymphoma, spleen: Secondary | ICD-10-CM

## 2018-12-09 LAB — IMMUNOFIXATION ELECTROPHORESIS
IgG (Immunoglobin G), Serum: 542 mg/dL — ABNORMAL LOW (ref 700–1600)
IgM (Immunoglobulin M), Srm: 46 mg/dL (ref 26–217)
Immunoglobulin A, (IgA) QN, Serum: 133 mg/dL (ref 87–352)
Total Protein: 5.8 g/dL — ABNORMAL LOW (ref 6.0–8.5)

## 2018-12-09 LAB — CBC WITH DIFFERENTIAL/PLATELET
Basophils Absolute: 0 10*3/uL (ref 0.0–0.2)
Basos: 0 %
EOS (ABSOLUTE): 0.1 10*3/uL (ref 0.0–0.4)
Eos: 3 %
Hematocrit: 39.2 % (ref 34.0–46.6)
Hemoglobin: 13.1 g/dL (ref 11.1–15.9)
IMMATURE GRANULOCYTES: 0 %
Immature Grans (Abs): 0 10*3/uL (ref 0.0–0.1)
Lymphocytes Absolute: 0.6 10*3/uL — ABNORMAL LOW (ref 0.7–3.1)
Lymphs: 15 %
MCH: 30 pg (ref 26.6–33.0)
MCHC: 33.4 g/dL (ref 31.5–35.7)
MCV: 90 fL (ref 79–97)
Monocytes Absolute: 0.3 10*3/uL (ref 0.1–0.9)
Monocytes: 7 %
Neutrophils Absolute: 3 10*3/uL (ref 1.4–7.0)
Neutrophils: 75 %
Platelets: 178 10*3/uL (ref 150–450)
RBC: 4.37 x10E6/uL (ref 3.77–5.28)
RDW: 13.3 % (ref 11.7–15.4)
WBC: 4.1 10*3/uL (ref 3.4–10.8)

## 2018-12-09 LAB — COMPREHENSIVE METABOLIC PANEL WITH GFR
ALT: 182 IU/L — ABNORMAL HIGH (ref 0–32)
AST: 74 IU/L — ABNORMAL HIGH (ref 0–40)
Albumin/Globulin Ratio: 2.4 — ABNORMAL HIGH (ref 1.2–2.2)
Albumin: 4.1 g/dL (ref 3.8–4.8)
Alkaline Phosphatase: 62 IU/L (ref 39–117)
BUN/Creatinine Ratio: 15 (ref 9–23)
BUN: 12 mg/dL (ref 6–24)
Bilirubin Total: 0.6 mg/dL (ref 0.0–1.2)
CO2: 22 mmol/L (ref 20–29)
Calcium: 8.8 mg/dL (ref 8.7–10.2)
Chloride: 100 mmol/L (ref 96–106)
Creatinine, Ser: 0.82 mg/dL (ref 0.57–1.00)
GFR calc Af Amer: 99 mL/min/1.73
GFR calc non Af Amer: 85 mL/min/1.73
Globulin, Total: 1.7 g/dL (ref 1.5–4.5)
Glucose: 139 mg/dL — ABNORMAL HIGH (ref 65–99)
Potassium: 4.3 mmol/L (ref 3.5–5.2)
Sodium: 138 mmol/L (ref 134–144)

## 2018-12-09 LAB — FERRITIN: Ferritin: 172 ng/mL — ABNORMAL HIGH (ref 15–150)

## 2018-12-09 LAB — RETICULOCYTES: Retic Ct Pct: 3.2 % — ABNORMAL HIGH (ref 0.6–2.6)

## 2018-12-09 LAB — LACTATE DEHYDROGENASE: LDH: 217 IU/L (ref 119–226)

## 2018-12-09 NOTE — Addendum Note (Signed)
Addended by: Truddie Crumble on: 12/09/2018 09:32 AM   Modules accepted: Orders

## 2018-12-10 ENCOUNTER — Telehealth: Payer: Self-pay | Admitting: *Deleted

## 2018-12-10 ENCOUNTER — Other Ambulatory Visit: Payer: Self-pay | Admitting: Oncology

## 2018-12-10 DIAGNOSIS — R945 Abnormal results of liver function studies: Secondary | ICD-10-CM

## 2018-12-10 DIAGNOSIS — C8307 Small cell B-cell lymphoma, spleen: Secondary | ICD-10-CM

## 2018-12-10 DIAGNOSIS — R7989 Other specified abnormal findings of blood chemistry: Secondary | ICD-10-CM

## 2018-12-10 LAB — HEPATITIS PANEL, ACUTE
HEP A IGM: NEGATIVE
Hep B C IgM: NEGATIVE
Hep C Virus Ab: 0.1 s/co ratio (ref 0.0–0.9)
Hepatitis B Surface Ag: NEGATIVE

## 2018-12-10 LAB — SPECIMEN STATUS REPORT

## 2018-12-10 NOTE — Telephone Encounter (Addendum)
Pt called / informed "CBC normal; IgM level remains low; 2 liver tests are elevated; we had enough sample left to check for hepatitis, will call when results available. Sometimes rituxan can activate hepatitis B; previous screen back in September was negative." per Dr Beryle Beams. She wants to know Rituxan can sometimes activate Hepatitis??  Also she needs a letter of Dr Synthia Innocent response about the toxins; doesn't have to be long. She did received his My Chart message. We can mail it to her.

## 2018-12-20 ENCOUNTER — Telehealth: Payer: Self-pay | Admitting: *Deleted

## 2018-12-20 NOTE — Telephone Encounter (Signed)
Received letter from Dr Beryle Beams - pt called /informed. She requested to fax it to [phone] and mail it also - done. Also letter to be scanned  Into pt's chart.

## 2018-12-20 NOTE — Telephone Encounter (Signed)
Message from pt - requesting a detail letter about how the toxins could affect her. Sated they had another meeting and need it as soon as possible. Thanks

## 2018-12-25 ENCOUNTER — Other Ambulatory Visit: Payer: Self-pay | Admitting: Oncology

## 2018-12-26 ENCOUNTER — Other Ambulatory Visit: Payer: Self-pay | Admitting: Oncology

## 2018-12-28 ENCOUNTER — Encounter: Payer: Self-pay | Admitting: Hematology and Oncology

## 2018-12-28 ENCOUNTER — Telehealth: Payer: Self-pay | Admitting: *Deleted

## 2018-12-28 ENCOUNTER — Other Ambulatory Visit: Payer: Self-pay | Admitting: Oncology

## 2018-12-28 ENCOUNTER — Encounter: Payer: Commercial Managed Care - PPO | Admitting: Oncology

## 2018-12-28 ENCOUNTER — Encounter: Payer: Self-pay | Admitting: *Deleted

## 2018-12-28 MED ORDER — AZITHROMYCIN 250 MG PO TABS: ORAL_TABLET | ORAL | 1 refills | Status: DC

## 2018-12-28 NOTE — Telephone Encounter (Signed)
Called pt - no answer and mailbox is full; unable to leave a message. I will send pt a My Chart message.

## 2018-12-28 NOTE — Telephone Encounter (Signed)
OK  I can send Rx for a Zpak with 1 refill.

## 2018-12-28 NOTE — Telephone Encounter (Signed)
Call from pt - wants to know if Dr Beryle Beams would prescribe her something ie antibiotic as a precautionary measure/to use as emergency. States she has stocked up on cold med ie Mucinex. She wants to be prepare if for any reason she's unable to go out. States she does not have cold symptoms at this time. Thanks

## 2018-12-30 ENCOUNTER — Encounter: Payer: Self-pay | Admitting: Hematology and Oncology

## 2018-12-30 ENCOUNTER — Inpatient Hospital Stay: Payer: Commercial Managed Care - PPO | Attending: Hematology and Oncology | Admitting: Hematology and Oncology

## 2018-12-30 ENCOUNTER — Telehealth: Payer: Self-pay | Admitting: Hematology and Oncology

## 2018-12-30 ENCOUNTER — Other Ambulatory Visit: Payer: Self-pay

## 2018-12-30 VITALS — BP 140/72 | HR 78 | Temp 98.3°F | Resp 18 | Ht 66.0 in | Wt 216.6 lb

## 2018-12-30 DIAGNOSIS — C8307 Small cell B-cell lymphoma, spleen: Secondary | ICD-10-CM | POA: Diagnosis not present

## 2018-12-30 DIAGNOSIS — R635 Abnormal weight gain: Secondary | ICD-10-CM | POA: Insufficient documentation

## 2018-12-30 DIAGNOSIS — D591 Autoimmune hemolytic anemia, unspecified: Secondary | ICD-10-CM

## 2018-12-30 DIAGNOSIS — R739 Hyperglycemia, unspecified: Secondary | ICD-10-CM | POA: Diagnosis not present

## 2018-12-30 DIAGNOSIS — R748 Abnormal levels of other serum enzymes: Secondary | ICD-10-CM | POA: Insufficient documentation

## 2018-12-30 DIAGNOSIS — Z79899 Other long term (current) drug therapy: Secondary | ICD-10-CM | POA: Insufficient documentation

## 2018-12-30 DIAGNOSIS — D472 Monoclonal gammopathy: Secondary | ICD-10-CM

## 2018-12-30 DIAGNOSIS — Z791 Long term (current) use of non-steroidal anti-inflammatories (NSAID): Secondary | ICD-10-CM | POA: Diagnosis not present

## 2018-12-30 DIAGNOSIS — T50905A Adverse effect of unspecified drugs, medicaments and biological substances, initial encounter: Secondary | ICD-10-CM | POA: Insufficient documentation

## 2018-12-30 DIAGNOSIS — R600 Localized edema: Secondary | ICD-10-CM | POA: Insufficient documentation

## 2018-12-30 DIAGNOSIS — Z7189 Other specified counseling: Secondary | ICD-10-CM

## 2018-12-30 NOTE — Assessment & Plan Note (Signed)
This is due to recent excessive prednisone use We discussed dietary modification and weight loss

## 2018-12-30 NOTE — Assessment & Plan Note (Signed)
She understood that the goal of treatment is palliative

## 2018-12-30 NOTE — Progress Notes (Signed)
START OFF PATHWAY REGIMEN - Lymphoma and CLL   [id number]:Rituximab 375 mg/m2 q2 Months (Maintenance):   A cycle is every 2 months:     Rituximab   **Always confirm dose/schedule in your pharmacy ordering system**  Patient Characteristics: Marginal Zone Lymphoma, Systemic, Second Line Disease Type: Marginal Zone Lymphoma Disease Type: Not Applicable Disease Type: Not Applicable Localized or Systemic Disease<= Systemic [place] Stage: III Line of Therapy: Second Line Intent of Therapy: Curative Intent, Not Discussed with Patient

## 2018-12-30 NOTE — Assessment & Plan Note (Signed)
This has resolved.  Her recent iron studies are adequate She will continue folic acid but I have told her to discontinue oral iron supplement

## 2018-12-30 NOTE — Assessment & Plan Note (Addendum)
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 

## 2018-12-30 NOTE — Assessment & Plan Note (Signed)
She is noted to have hyperglycemia and have sugary intake We discussed dietary modification For now, she does not need medication for hyperglycemia

## 2018-12-30 NOTE — Progress Notes (Signed)
[facility] CONSULT NOTE  Patient Care Team: Sofie Hartigan, MD as PCP - General (Family Medicine) Annia Belt, MD as Consulting Physician (Oncology) Heath Lark, MD as Consulting Physician (Hematology and Oncology)  ASSESSMENT & PLAN:  Lymphoma, marginal zone, spleen ([place]) She tolerated rituximab well with resolution of autoimmune hemolytic anemia We will continue treatment every 60 days with premedication I plan to repeat imaging study in August to follow-up on the abnormal lymph node and her spleen.  AIHA (autoimmune hemolytic anemia) (H[facility]) This has resolved.  Her recent iron studies are adequate She will continue folic acid but I have told her to discontinue oral iron supplement  Bilateral leg edema This is due to excessive fluid retention We discussed dietary modification, exercise and weight loss strategy She will continue furosemide for now, with goal weight of 200 pounds  Elevated liver enzymes This is likely due to fatty liver disease We discussed dietary modification and weight loss strategy Her recent repeat hepatitis panel is within normal limits  Excessive weight gain This is due to recent excessive prednisone use We discussed dietary modification and weight loss  Hyperglycemia, drug-induced She is noted to have hyperglycemia and have sugary intake We discussed dietary modification For now, she does not need medication for hyperglycemia  Goals of care, counseling/discussion She understood that the goal of treatment is palliative   Orders Placed This Encounter  Procedures  . Comprehensive metabolic panel    Standing Status:   Standing    Number of Occurrences:   9    Standing Expiration Date:   12/30/2019  . CBC with Differential/Platelet    Standing Status:   Standing    Number of Occurrences:   9    Standing Expiration Date:   12/30/2019  . Lactate dehydrogenase    Standing Status:   Standing    Number of Occurrences:   9     Standing Expiration Date:   12/30/2019  . IgM    Standing Status:   Standing    Number of Occurrences:   9    Standing Expiration Date:   12/30/2019     CHIEF COMPLAINTS/PURPOSE OF CONSULTATION:  Recurrent splenic marginal zone lymphoma, with presentation of recurrent hemolytic anemia, on maintenance rituximab  HISTORY OF PRESENTING ILLNESS:  Kara Perkins 48 y.o. female is here because her oncologist is retiring. I have treated her in the past with cladribine and rituximab for splenic marginal zone lymphoma Last year, she started to present with fatigue and recurrent hemolytic anemia.  She was treated with corticosteroid therapy and subsequently underwent further evaluation and imaging study She responded well with induction weekly rituximab, followed by maintenance treatment every 60 days. Her last dose was in November 26, 2018.  She is transitioning her care back to me now that her oncologist is retiring I have reviewed her chart and materials related to her cancer extensively and collaborated history with the patient. Summary of oncologic history is as follows:   Lymphoma, marginal zone, spleen ([place])   03/23/2014 Bone Marrow Biopsy    Bone marrow is involved    04/19/2014 Imaging    PET CT scan showed lymphadenopathy and splenomegaly    04/27/2014 - 06/01/2014 Chemotherapy    She received weekly rituximab and cladribine X 6 cycles    05/21/2018 PET scan    1. 8 mm in short axis right level I a lymph node with Deauville 4 activity, increased from prior. 2. Small but mildly hypermetabolic left  axillary lymph node with Deauville 4 activity. 3. Splenic activity only minimally greater than that of the liver, but with a moderate amount of splenomegaly (splenic volume 680 cubic cm). No focal splenic lesion observed. 4. Diffuse mildly increased activity in the skeleton without corresponding CT abnormality. Possibilities might include low-level marrow infiltration or granulocyte  stimulation.    07/02/2018 - 11/26/2018 Chemotherapy    The patient had rituximab for chemotherapy treatment.      12/30/2018 Cancer Staging    Staging form: Hodgkin and Non-Hodgkin Lymphoma, AJCC 7th Edition - Clinical: S - Spleen, B - Symptoms - Signed by Heath Lark, MD on 12/30/2018    She has gained a lot of weight with treatment.  She also have significant bilateral lower extremity edema complicated by blister on her skin. She has lost significant amount of weight with furosemide.  She still feels quite swollen. She is taking iron supplement due to history of anemia.  Her recent iron studies are adequate. She denies new lymphadenopathy.  MEDICAL HISTORY:  Past Medical History:  Diagnosis Date  . Constipation 05/11/2014  . 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 Iron deficiency anemia 11/05/2011  . Lymphoma, marginal zone, spleen ([place]) 04/11/2014  . Perimenopausal menorrhagia 07/26/2013  . Positive Coombs test   . Positive Coombs test 11/05/2011    SURGICAL HISTORY: Past Surgical History:  Procedure Laterality Date  . CHOLECYSTECTOMY    . DILATATION & CURETTAGE/HYSTEROSCOPY WITH MYOSURE N/A 12/06/2016   Procedure: DILATATION & CURETTAGE/HYSTEROSCOPY WITH MYOSURE;  Surgeon: Everlene Farrier, MD;  Location: [facility];  Service: Gynecology;  Laterality: N/A;  HOLD MYSOSURE ITEMS    SOCIAL HISTORY: Social History   Socioeconomic History  . Marital status: Married    Spouse name: Not on file  . Number of children: Not on file  . Years of education: Not on file  . Highest education level: Not on file  Occupational History  . Not on file  Social Needs  . Financial resource strain: Not on file  . Food insecurity:    Worry: Not on file    Inability: Not on file  . Transportation needs:    Medical: Not on file    Non-medical: Not on file  Tobacco Use  . Smoking status: Never Smoker  . Smokeless tobacco: Never Used  Substance and Sexual Activity  .  Alcohol use: No    Alcohol/week: 0.0 standard drinks  . Drug use: No  . Sexual activity: Yes  Lifestyle  . Physical activity:    Days per week: Not on file    Minutes per session: Not on file  . Stress: Not on file  Relationships  . Social connections:    Talks on phone: Not on file    Gets together: Not on file    Attends religious service: Not on file    Active member of club or organization: Not on file    Attends meetings of clubs or organizations: Not on file    Relationship status: Not on file  . Intimate partner violence:    Fear of current or ex partner: Not on file    Emotionally abused: Not on file    Physically abused: Not on file    Forced sexual activity: Not on file  Other Topics Concern  . Not on file  Social History Narrative  . Not on file    FAMILY HISTORY: History reviewed. No pertinent family history.  ALLERGIES:  has No Known Allergies.  MEDICATIONS:  Current Outpatient Medications  Medication Sig Dispense Refill  . fluticasone (FLONASE) 50 MCG/ACT nasal spray Place 1-2 sprays into both nostrils daily as needed for rhinitis.     . folic acid (FOLVITE) 1 MG tablet Take 1 tablet (1 mg total) by mouth daily. 100 tablet 3  . furosemide (LASIX) 20 MG tablet TAKE 1 TABLET BY MOUTH EVERY DAY 30 tablet 0  . ibuprofen (ADVIL,MOTRIN) 200 MG tablet Take 400-800 mg by mouth every 6 (six) hours as needed for headache, mild pain or moderate pain.     . pantoprazole (PROTONIX) 20 MG tablet TAKE 1 TABLET BY MOUTH EVERY DAY 90 tablet 2  . Potassium Chloride ER 20 MEQ TBCR TAKE 1 TABLET BY MOUTH EVERY DAY 90 tablet 1   No current facility-administered medications for this visit.     REVIEW OF SYSTEMS:   Constitutional: Denies fevers, chills or abnormal night sweats Eyes: Denies blurriness of vision, double vision or watery eyes Ears, nose, mouth, throat, and face: Denies mucositis or sore throat Respiratory: Denies cough, dyspnea or wheezes Cardiovascular: Denies  palpitation, chest discomfort  Gastrointestinal:  Denies nausea, heartburn or change in bowel habits Lymphatics: Denies new lymphadenopathy or easy bruising Neurological:Denies numbness, tingling or new weaknesses Behavioral/Psych: Mood is stable, no new changes  All other systems were reviewed with the patient and are negative.  PHYSICAL EXAMINATION: ECOG PERFORMANCE STATUS: 1 - Symptomatic but completely ambulatory  Vitals:   12/30/18 0837  BP: 140/72  Pulse: 78  Resp: 18  Temp: 98.3 F (36.8 C)  SpO2: 100%   Filed Weights   12/30/18 0837  Weight: 216 lb 9.6 oz (98.2 kg)    GENERAL:alert, no distress and comfortable SKIN: She had a healed blister over the anterior portion of her right shin EYES: normal, conjunctiva are pink and non-injected, sclera clear OROPHARYNX:no exudate, no erythema and lips, buccal mucosa, and tongue normal  NECK: supple, thyroid normal size, non-tender, without nodularity LYMPH:  no palpable lymphadenopathy in the cervical, axillary or inguinal LUNGS: clear to auscultation and percussion with normal breathing effort HEART: regular rate & rhythm and no murmurs with moderate bilateral lower extremity edema ABDOMEN:abdomen soft, non-tender and normal bowel sounds Musculoskeletal:no cyanosis of digits and no clubbing  PSYCH: alert & oriented x 3 with fluent speech NEURO: no focal motor/sensory deficits  LABORATORY DATA:  I have reviewed the data as listed Lab Results  Component Value Date   WBC 4.1 12/08/2018   HGB 13.1 12/08/2018   HCT 39.2 12/08/2018   MCV 90 12/08/2018   PLT 178 12/08/2018   Recent Labs    08/24/18 0855 10/18/18 0841 12/08/18 0931  NA 140 142 138  K 3.7 4.1 4.3  CL 101 103 100  CO2 '23 21 22  ' GLUCOSE 117* 106* 139*  BUN '16 11 12  ' CREATININE 0.89 0.83 0.82  CALCIUM 9.0 9.0 8.8  GFRNONAA 77 84 85  GFRAA 89 97 99  PROT 6.0 5.6* 5.8*  ALBUMIN 4.2 4.0 4.1  AST 15 40 74*  ALT 46* 89* 182*  ALKPHOS 52 55 62  BILITOT  0.8 1.0 0.6    RADIOGRAPHIC STUDIES: I have reviewed her PET CT scan from last year I have personally reviewed the radiological images as listed and agreed with the findings in the report.   I spent 55 minutes counseling the patient face to face. The total time spent in the appointment was 60 minutes and more than 50% was on counseling.  All questions were  answered. The patient knows to call the clinic with any problems, questions or concerns.  Heath Lark, MD 12/30/2018 9:25 AM

## 2018-12-30 NOTE — Assessment & Plan Note (Addendum)
This is likely due to fatty liver disease We discussed dietary modification and weight loss strategy Her recent repeat hepatitis panel is within normal limits

## 2018-12-30 NOTE — Assessment & Plan Note (Signed)
This is due to excessive fluid retention We discussed dietary modification, exercise and weight loss strategy She will continue furosemide for now, with goal weight of 200 pounds

## 2018-12-30 NOTE — Telephone Encounter (Signed)
Gave avs and calendar ° °

## 2019-01-06 ENCOUNTER — Encounter: Payer: Self-pay | Admitting: Hematology and Oncology

## 2019-01-21 ENCOUNTER — Inpatient Hospital Stay: Payer: Commercial Managed Care - PPO | Attending: Hematology and Oncology

## 2019-01-21 ENCOUNTER — Encounter (HOSPITAL_COMMUNITY): Payer: Commercial Managed Care - PPO

## 2019-01-21 ENCOUNTER — Inpatient Hospital Stay: Payer: Commercial Managed Care - PPO

## 2019-01-21 ENCOUNTER — Other Ambulatory Visit: Payer: Self-pay

## 2019-01-21 VITALS — BP 141/81 | HR 77 | Temp 98.6°F | Resp 18

## 2019-01-21 DIAGNOSIS — C8307 Small cell B-cell lymphoma, spleen: Secondary | ICD-10-CM

## 2019-01-21 DIAGNOSIS — Z5112 Encounter for antineoplastic immunotherapy: Secondary | ICD-10-CM | POA: Insufficient documentation

## 2019-01-21 DIAGNOSIS — D591 Autoimmune hemolytic anemia, unspecified: Secondary | ICD-10-CM

## 2019-01-21 DIAGNOSIS — D472 Monoclonal gammopathy: Secondary | ICD-10-CM

## 2019-01-21 LAB — CBC WITH DIFFERENTIAL/PLATELET
Abs Immature Granulocytes: 0.01 10*3/uL (ref 0.00–0.07)
Basophils Absolute: 0 10*3/uL (ref 0.0–0.1)
Basophils Relative: 1 %
Eosinophils Absolute: 0.1 10*3/uL (ref 0.0–0.5)
Eosinophils Relative: 4 %
HCT: 38 % (ref 36.0–46.0)
Hemoglobin: 13 g/dL (ref 12.0–15.0)
Immature Granulocytes: 0 %
Lymphocytes Relative: 20 %
Lymphs Abs: 0.7 10*3/uL (ref 0.7–4.0)
MCH: 29.8 pg (ref 26.0–34.0)
MCHC: 34.2 g/dL (ref 30.0–36.0)
MCV: 87.2 fL (ref 80.0–100.0)
Monocytes Absolute: 0.3 10*3/uL (ref 0.1–1.0)
Monocytes Relative: 8 %
Neutro Abs: 2.2 10*3/uL (ref 1.7–7.7)
Neutrophils Relative %: 67 %
Platelets: 159 10*3/uL (ref 150–400)
RBC: 4.36 MIL/uL (ref 3.87–5.11)
RDW: 13.7 % (ref 11.5–15.5)
WBC: 3.3 10*3/uL — ABNORMAL LOW (ref 4.0–10.5)
nRBC: 0 % (ref 0.0–0.2)

## 2019-01-21 LAB — COMPREHENSIVE METABOLIC PANEL
ALT: 127 U/L — ABNORMAL HIGH (ref 0–44)
AST: 49 U/L — ABNORMAL HIGH (ref 15–41)
Albumin: 4.1 g/dL (ref 3.5–5.0)
Alkaline Phosphatase: 59 U/L (ref 38–126)
Anion gap: 11 (ref 5–15)
BUN: 15 mg/dL (ref 6–20)
CO2: 22 mmol/L (ref 22–32)
Calcium: 8.6 mg/dL — ABNORMAL LOW (ref 8.9–10.3)
Chloride: 106 mmol/L (ref 98–111)
Creatinine, Ser: 0.8 mg/dL (ref 0.44–1.00)
GFR calc Af Amer: >60 mL/min (ref >60)
GFR calc non Af Amer: >60 mL/min (ref >60)
Glucose, Bld: 96 mg/dL (ref 70–99)
Potassium: 4 mmol/L (ref 3.5–5.1)
Sodium: 139 mmol/L (ref 135–145)
Total Bilirubin: 0.9 mg/dL (ref 0.3–1.2)
Total Protein: 6.2 g/dL — ABNORMAL LOW (ref 6.5–8.1)

## 2019-01-21 LAB — LACTATE DEHYDROGENASE: LDH: 283 U/L — ABNORMAL HIGH (ref 98–192)

## 2019-01-21 MED ORDER — SODIUM CHLORIDE 0.9 % IV SOLN
375.0000 mg/m2 | Freq: Once | INTRAVENOUS | Status: AC
  Administered 2019-01-21: 11:00:00 800 mg via INTRAVENOUS
  Filled 2019-01-21: qty 30

## 2019-01-21 MED ORDER — METHYLPREDNISOLONE SODIUM SUCC 40 MG IJ SOLR
40.0000 mg | Freq: Once | INTRAMUSCULAR | Status: AC
  Administered 2019-01-21: 11:00:00 40 mg via INTRAVENOUS

## 2019-01-21 MED ORDER — METHYLPREDNISOLONE SODIUM SUCC 40 MG IJ SOLR
INTRAMUSCULAR | Status: AC
  Filled 2019-01-21: qty 1

## 2019-01-21 MED ORDER — DIPHENHYDRAMINE HCL 25 MG PO CAPS
25.0000 mg | ORAL_CAPSULE | Freq: Once | ORAL | Status: AC
  Administered 2019-01-21: 25 mg via ORAL

## 2019-01-21 MED ORDER — ACETAMINOPHEN 325 MG PO TABS
ORAL_TABLET | ORAL | Status: AC
  Filled 2019-01-21: qty 2

## 2019-01-21 MED ORDER — SODIUM CHLORIDE 0.9 % IV SOLN
Freq: Once | INTRAVENOUS | Status: AC
  Administered 2019-01-21: 10:00:00 via INTRAVENOUS
  Filled 2019-01-21: qty 250

## 2019-01-21 MED ORDER — ACETAMINOPHEN 325 MG PO TABS
650.0000 mg | ORAL_TABLET | Freq: Once | ORAL | Status: AC
  Administered 2019-01-21: 650 mg via ORAL

## 2019-01-21 MED ORDER — DIPHENHYDRAMINE HCL 25 MG PO CAPS
ORAL_CAPSULE | ORAL | Status: AC
  Filled 2019-01-21: qty 1

## 2019-01-21 NOTE — Patient Instructions (Signed)
[place] Discharge Instructions for Patients Receiving Chemotherapy  Today you received the following agents Rituxan     If you develop nausea and vomiting that is not controlled by your nausea medication, call the clinic.   BELOW ARE SYMPTOMS THAT SHOULD BE REPORTED IMMEDIATELY:  *FEVER GREATER THAN 100.5 F  *CHILLS WITH OR WITHOUT FEVER  NAUSEA AND VOMITING THAT IS NOT CONTROLLED WITH YOUR NAUSEA MEDICATION  *UNUSUAL SHORTNESS OF BREATH  *UNUSUAL BRUISING OR BLEEDING  TENDERNESS IN MOUTH AND THROAT WITH OR WITHOUT PRESENCE OF ULCERS  *URINARY PROBLEMS  *BOWEL PROBLEMS  UNUSUAL RASH Items with * indicate a potential emergency and should be followed up as soon as possible.  Feel free to call the clinic should you have any questions or concerns. The clinic phone number is [phone].  Please show the [place] at check-in to the Emergency Department and triage nurse.   Rituximab injection What is this medicine? RITUXIMAB (ri TUX i mab) is a monoclonal antibody. It is used to treat certain types of cancer like non-Hodgkin lymphoma and chronic lymphocytic leukemia. It is also used to treat rheumatoid arthritis, granulomatosis with polyangiitis (or Wegener's granulomatosis), microscopic polyangiitis, and pemphigus vulgaris. This medicine may be used for other purposes; ask your health care provider or pharmacist if you have questions. COMMON BRAND NAME(S): Rituxan What should I tell my health care provider before I take this medicine? They need to know if you have any of these conditions: -heart disease -infection (especially a virus infection such as hepatitis B, chickenpox, cold sores, or herpes) -immune system problems -irregular heartbeat -kidney disease -low blood counts, like low white cell, platelet, or red cell counts -lung or breathing disease, like asthma -recently received or scheduled to receive a vaccine -an unusual or allergic  reaction to rituximab, other medicines, foods, dyes, or preservatives -pregnant or trying to get pregnant -breast-feeding How should I use this medicine? This medicine is for infusion into a vein. It is administered in a hospital or clinic by a specially trained health care professional. A special MedGuide will be given to you by the pharmacist with each prescription and refill. Be sure to read this information carefully each time. Talk to your pediatrician regarding the use of this medicine in children. This medicine is not approved for use in children. Overdosage: If you think you have taken too much of this medicine contact a poison control center or emergency room at once. NOTE: This medicine is only for you. Do not share this medicine with others. What if I miss a dose? It is important not to miss a dose. Call your doctor or health care professional if you are unable to keep an appointment. What may interact with this medicine? -cisplatin -live virus vaccines This list may not describe all possible interactions. Give your health care provider a list of all the medicines, herbs, non-prescription drugs, or dietary supplements you use. Also tell them if you smoke, drink alcohol, or use illegal drugs. Some items may interact with your medicine. What should I watch for while using this medicine? Your condition will be monitored carefully while you are receiving this medicine. You may need blood work done while you are taking this medicine. This medicine can cause serious allergic reactions. To reduce your risk you may need to take medicine before treatment with this medicine. Take your medicine as directed. In some patients, this medicine may cause a serious brain infection that may cause death. If you have  any problems seeing, thinking, speaking, walking, or standing, tell your healthcare professional right away. If you cannot reach your healthcare professional, urgently seek other source of  medical care. Call your doctor or health care professional for advice if you get a fever, chills or sore throat, or other symptoms of a cold or flu. Do not treat yourself. This drug decreases your body's ability to fight infections. Try to avoid being around people who are sick. Do not become pregnant while taking this medicine or for 12 months after stopping it. Women should inform their doctor if they wish to become pregnant or think they might be pregnant. There is a potential for serious side effects to an unborn child. Talk to your health care professional or pharmacist for more information. Do not breast-feed an infant while taking this medicine or for 6 months after stopping it. What side effects may I notice from receiving this medicine? Side effects that you should report to your doctor or health care professional as soon as possible: -allergic reactions like skin rash, itching or hives; swelling of the face, lips, or tongue -breathing problems -chest pain -changes in vision -diarrhea -headache with fever, neck stiffness, sensitivity to light, nausea, or confusion -fast, irregular heartbeat -loss of memory -low blood counts - this medicine may decrease the number of white blood cells, red blood cells and platelets. You may be at increased risk for infections and bleeding. -mouth sores -problems with balance, talking, or walking -redness, blistering, peeling or loosening of the skin, including inside the mouth -signs of infection - fever or chills, cough, sore throat, pain or difficulty passing urine -signs and symptoms of kidney injury like trouble passing urine or change in the amount of urine -signs and symptoms of liver injury like dark yellow or brown urine; general ill feeling or flu-like symptoms; light-colored stools; loss of appetite; nausea; right upper belly pain; unusually weak or tired; yellowing of the eyes or skin -signs and symptoms of low blood pressure like dizziness;  feeling faint or lightheaded, falls; unusually weak or tired -stomach pain -swelling of the ankles, feet, hands -unusual bleeding or bruising -vomiting Side effects that usually do not require medical attention (report to your doctor or health care professional if they continue or are bothersome): -headache -joint pain -muscle cramps or muscle pain -nausea -tiredness This list may not describe all possible side effects. Call your doctor for medical advice about side effects. You may report side effects to FDA at 1-800-FDA-1088. Where should I keep my medicine? This drug is given in a hospital or clinic and will not be stored at home. NOTE: This sheet is a summary. It may not cover all possible information. If you have questions about this medicine, talk to your doctor, pharmacist, or health care provider.  2019 Elsevier/Gold Standard (2017-09-11 13:04:32)

## 2019-01-22 LAB — IGM: IgM (Immunoglobulin M), Srm: 33 mg/dL (ref 26–217)

## 2019-01-25 ENCOUNTER — Other Ambulatory Visit: Payer: Self-pay | Admitting: Oncology

## 2019-01-31 ENCOUNTER — Other Ambulatory Visit: Payer: Commercial Managed Care - PPO

## 2019-01-31 ENCOUNTER — Ambulatory Visit: Payer: Commercial Managed Care - PPO | Admitting: Hematology and Oncology

## 2019-02-14 ENCOUNTER — Encounter: Payer: Self-pay | Admitting: Hematology and Oncology

## 2019-02-22 ENCOUNTER — Other Ambulatory Visit: Payer: Self-pay | Admitting: Oncology

## 2019-02-22 ENCOUNTER — Encounter: Payer: Self-pay | Admitting: Hematology and Oncology

## 2019-02-22 MED ORDER — FUROSEMIDE 20 MG PO TABS: 20.0000 mg | ORAL_TABLET | Freq: Every day | ORAL | 9 refills | Status: DC

## 2019-02-23 ENCOUNTER — Other Ambulatory Visit: Payer: Self-pay | Admitting: Hematology and Oncology

## 2019-02-23 MED ORDER — FLUTICASONE PROPIONATE 50 MCG/ACT NA SUSP: 1.0000 | Freq: Every day | NASAL | 1 refills | Status: DC | PRN

## 2019-03-23 ENCOUNTER — Other Ambulatory Visit: Payer: Self-pay | Admitting: Hematology and Oncology

## 2019-03-25 ENCOUNTER — Other Ambulatory Visit: Payer: Self-pay

## 2019-03-25 ENCOUNTER — Encounter: Payer: Self-pay | Admitting: Hematology and Oncology

## 2019-03-25 ENCOUNTER — Inpatient Hospital Stay: Payer: Commercial Managed Care - PPO | Attending: Hematology and Oncology

## 2019-03-25 ENCOUNTER — Inpatient Hospital Stay: Payer: Commercial Managed Care - PPO

## 2019-03-25 ENCOUNTER — Inpatient Hospital Stay (HOSPITAL_BASED_OUTPATIENT_CLINIC_OR_DEPARTMENT_OTHER): Payer: Commercial Managed Care - PPO | Admitting: Hematology and Oncology

## 2019-03-25 VITALS — BP 141/86 | HR 73 | Temp 98.5°F | Resp 18

## 2019-03-25 DIAGNOSIS — D472 Monoclonal gammopathy: Secondary | ICD-10-CM

## 2019-03-25 DIAGNOSIS — Z5112 Encounter for antineoplastic immunotherapy: Secondary | ICD-10-CM | POA: Insufficient documentation

## 2019-03-25 DIAGNOSIS — R748 Abnormal levels of other serum enzymes: Secondary | ICD-10-CM

## 2019-03-25 DIAGNOSIS — D591 Autoimmune hemolytic anemia, unspecified: Secondary | ICD-10-CM

## 2019-03-25 DIAGNOSIS — C8307 Small cell B-cell lymphoma, spleen: Secondary | ICD-10-CM

## 2019-03-25 DIAGNOSIS — Z7951 Long term (current) use of inhaled steroids: Secondary | ICD-10-CM | POA: Diagnosis not present

## 2019-03-25 DIAGNOSIS — Z79899 Other long term (current) drug therapy: Secondary | ICD-10-CM

## 2019-03-25 DIAGNOSIS — D701 Agranulocytosis secondary to cancer chemotherapy: Secondary | ICD-10-CM

## 2019-03-25 LAB — CBC WITH DIFFERENTIAL/PLATELET
Abs Immature Granulocytes: 0 10*3/uL (ref 0.00–0.07)
Basophils Absolute: 0 10*3/uL (ref 0.0–0.1)
Basophils Relative: 1 %
Eosinophils Absolute: 0.1 10*3/uL (ref 0.0–0.5)
Eosinophils Relative: 3 %
HCT: 37.9 % (ref 36.0–46.0)
Hemoglobin: 13.3 g/dL (ref 12.0–15.0)
Immature Granulocytes: 0 %
Lymphocytes Relative: 16 %
Lymphs Abs: 0.6 10*3/uL — ABNORMAL LOW (ref 0.7–4.0)
MCH: 30.9 pg (ref 26.0–34.0)
MCHC: 35.1 g/dL (ref 30.0–36.0)
MCV: 88.1 fL (ref 80.0–100.0)
Monocytes Absolute: 0.3 10*3/uL (ref 0.1–1.0)
Monocytes Relative: 8 %
Neutro Abs: 2.6 10*3/uL (ref 1.7–7.7)
Neutrophils Relative %: 72 %
Platelets: 178 10*3/uL (ref 150–400)
RBC: 4.3 MIL/uL (ref 3.87–5.11)
RDW: 13.5 % (ref 11.5–15.5)
WBC: 3.6 10*3/uL — ABNORMAL LOW (ref 4.0–10.5)
nRBC: 0 % (ref 0.0–0.2)

## 2019-03-25 LAB — LACTATE DEHYDROGENASE: LDH: 181 U/L (ref 98–192)

## 2019-03-25 LAB — COMPREHENSIVE METABOLIC PANEL
ALT: 95 U/L — ABNORMAL HIGH (ref 0–44)
AST: 43 U/L — ABNORMAL HIGH (ref 15–41)
Albumin: 4.1 g/dL (ref 3.5–5.0)
Alkaline Phosphatase: 74 U/L (ref 38–126)
Anion gap: 9 (ref 5–15)
BUN: 11 mg/dL (ref 6–20)
CO2: 23 mmol/L (ref 22–32)
Calcium: 8.5 mg/dL — ABNORMAL LOW (ref 8.9–10.3)
Chloride: 107 mmol/L (ref 98–111)
Creatinine, Ser: 0.82 mg/dL (ref 0.44–1.00)
GFR calc Af Amer: >60 mL/min (ref >60)
GFR calc non Af Amer: >60 mL/min (ref >60)
Glucose, Bld: 103 mg/dL — ABNORMAL HIGH (ref 70–99)
Potassium: 3.8 mmol/L (ref 3.5–5.1)
Sodium: 139 mmol/L (ref 135–145)
Total Bilirubin: 0.9 mg/dL (ref 0.3–1.2)
Total Protein: 6.5 g/dL (ref 6.5–8.1)

## 2019-03-25 MED ORDER — DIPHENHYDRAMINE HCL 25 MG PO CAPS
ORAL_CAPSULE | ORAL | Status: AC
  Filled 2019-03-25: qty 1

## 2019-03-25 MED ORDER — SODIUM CHLORIDE 0.9 % IV SOLN
375.0000 mg/m2 | Freq: Once | INTRAVENOUS | Status: AC
  Administered 2019-03-25: 800 mg via INTRAVENOUS
  Filled 2019-03-25: qty 30

## 2019-03-25 MED ORDER — SODIUM CHLORIDE 0.9 % IV SOLN
Freq: Once | INTRAVENOUS | Status: AC
  Administered 2019-03-25: 10:00:00 via INTRAVENOUS
  Filled 2019-03-25: qty 250

## 2019-03-25 MED ORDER — METHYLPREDNISOLONE SODIUM SUCC 40 MG IJ SOLR
40.0000 mg | Freq: Once | INTRAMUSCULAR | Status: AC
  Administered 2019-03-25: 40 mg via INTRAVENOUS

## 2019-03-25 MED ORDER — ACETAMINOPHEN 325 MG PO TABS
650.0000 mg | ORAL_TABLET | Freq: Once | ORAL | Status: AC
  Administered 2019-03-25: 650 mg via ORAL

## 2019-03-25 MED ORDER — METHYLPREDNISOLONE SODIUM SUCC 40 MG IJ SOLR
INTRAMUSCULAR | Status: AC
  Filled 2019-03-25: qty 1

## 2019-03-25 MED ORDER — DIPHENHYDRAMINE HCL 25 MG PO CAPS
25.0000 mg | ORAL_CAPSULE | Freq: Once | ORAL | Status: AC
  Administered 2019-03-25: 25 mg via ORAL

## 2019-03-25 MED ORDER — ACETAMINOPHEN 325 MG PO TABS
ORAL_TABLET | ORAL | Status: AC
  Filled 2019-03-25: qty 2

## 2019-03-25 NOTE — Patient Instructions (Signed)
[place] Discharge Instructions for Patients Receiving Chemotherapy  Today you received the following agents Rituxan     If you develop nausea and vomiting that is not controlled by your nausea medication, call the clinic.   BELOW ARE SYMPTOMS THAT SHOULD BE REPORTED IMMEDIATELY:  *FEVER GREATER THAN 100.5 F  *CHILLS WITH OR WITHOUT FEVER  NAUSEA AND VOMITING THAT IS NOT CONTROLLED WITH YOUR NAUSEA MEDICATION  *UNUSUAL SHORTNESS OF BREATH  *UNUSUAL BRUISING OR BLEEDING  TENDERNESS IN MOUTH AND THROAT WITH OR WITHOUT PRESENCE OF ULCERS  *URINARY PROBLEMS  *BOWEL PROBLEMS  UNUSUAL RASH Items with * indicate a potential emergency and should be followed up as soon as possible.  Feel free to call the clinic should you have any questions or concerns. The clinic phone number is [phone].  Please show the [place] at check-in to the Emergency Department and triage nurse.   Rituximab injection What is this medicine? RITUXIMAB (ri TUX i mab) is a monoclonal antibody. It is used to treat certain types of cancer like non-Hodgkin lymphoma and chronic lymphocytic leukemia. It is also used to treat rheumatoid arthritis, granulomatosis with polyangiitis (or Wegener's granulomatosis), microscopic polyangiitis, and pemphigus vulgaris. This medicine may be used for other purposes; ask your health care provider or pharmacist if you have questions. COMMON BRAND NAME(S): Rituxan What should I tell my health care provider before I take this medicine? They need to know if you have any of these conditions: -heart disease -infection (especially a virus infection such as hepatitis B, chickenpox, cold sores, or herpes) -immune system problems -irregular heartbeat -kidney disease -low blood counts, like low white cell, platelet, or red cell counts -lung or breathing disease, like asthma -recently received or scheduled to receive a vaccine -an unusual or allergic  reaction to rituximab, other medicines, foods, dyes, or preservatives -pregnant or trying to get pregnant -breast-feeding How should I use this medicine? This medicine is for infusion into a vein. It is administered in a hospital or clinic by a specially trained health care professional. A special MedGuide will be given to you by the pharmacist with each prescription and refill. Be sure to read this information carefully each time. Talk to your pediatrician regarding the use of this medicine in children. This medicine is not approved for use in children. Overdosage: If you think you have taken too much of this medicine contact a poison control center or emergency room at once. NOTE: This medicine is only for you. Do not share this medicine with others. What if I miss a dose? It is important not to miss a dose. Call your doctor or health care professional if you are unable to keep an appointment. What may interact with this medicine? -cisplatin -live virus vaccines This list may not describe all possible interactions. Give your health care provider a list of all the medicines, herbs, non-prescription drugs, or dietary supplements you use. Also tell them if you smoke, drink alcohol, or use illegal drugs. Some items may interact with your medicine. What should I watch for while using this medicine? Your condition will be monitored carefully while you are receiving this medicine. You may need blood work done while you are taking this medicine. This medicine can cause serious allergic reactions. To reduce your risk you may need to take medicine before treatment with this medicine. Take your medicine as directed. In some patients, this medicine may cause a serious brain infection that may cause death. If you have  any problems seeing, thinking, speaking, walking, or standing, tell your healthcare professional right away. If you cannot reach your healthcare professional, urgently seek other source of  medical care. Call your doctor or health care professional for advice if you get a fever, chills or sore throat, or other symptoms of a cold or flu. Do not treat yourself. This drug decreases your body's ability to fight infections. Try to avoid being around people who are sick. Do not become pregnant while taking this medicine or for 12 months after stopping it. Women should inform their doctor if they wish to become pregnant or think they might be pregnant. There is a potential for serious side effects to an unborn child. Talk to your health care professional or pharmacist for more information. Do not breast-feed an infant while taking this medicine or for 6 months after stopping it. What side effects may I notice from receiving this medicine? Side effects that you should report to your doctor or health care professional as soon as possible: -allergic reactions like skin rash, itching or hives; swelling of the face, lips, or tongue -breathing problems -chest pain -changes in vision -diarrhea -headache with fever, neck stiffness, sensitivity to light, nausea, or confusion -fast, irregular heartbeat -loss of memory -low blood counts - this medicine may decrease the number of white blood cells, red blood cells and platelets. You may be at increased risk for infections and bleeding. -mouth sores -problems with balance, talking, or walking -redness, blistering, peeling or loosening of the skin, including inside the mouth -signs of infection - fever or chills, cough, sore throat, pain or difficulty passing urine -signs and symptoms of kidney injury like trouble passing urine or change in the amount of urine -signs and symptoms of liver injury like dark yellow or brown urine; general ill feeling or flu-like symptoms; light-colored stools; loss of appetite; nausea; right upper belly pain; unusually weak or tired; yellowing of the eyes or skin -signs and symptoms of low blood pressure like dizziness;  feeling faint or lightheaded, falls; unusually weak or tired -stomach pain -swelling of the ankles, feet, hands -unusual bleeding or bruising -vomiting Side effects that usually do not require medical attention (report to your doctor or health care professional if they continue or are bothersome): -headache -joint pain -muscle cramps or muscle pain -nausea -tiredness This list may not describe all possible side effects. Call your doctor for medical advice about side effects. You may report side effects to FDA at 1-800-FDA-1088. Where should I keep my medicine? This drug is given in a hospital or clinic and will not be stored at home. NOTE: This sheet is a summary. It may not cover all possible information. If you have questions about this medicine, talk to your doctor, pharmacist, or health care provider.  2019 Elsevier/Gold Standard (2017-09-11 13:04:32)

## 2019-03-25 NOTE — Assessment & Plan Note (Addendum)
She has mild leukopenia but not symptomatic We will proceed with treatment without adjustment We discussed neutropenic precaution I advised the patient to work from home if possible due to risk of infection while on treatment.

## 2019-03-25 NOTE — Assessment & Plan Note (Signed)
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

## 2019-03-25 NOTE — Progress Notes (Signed)
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  Leukopenia due to antineoplastic chemotherapy [place]) She has mild leukopenia but not symptomatic We will proceed with treatment without adjustment We discussed neutropenic precaution I advised the patient to work from home if possible due to risk of infection while on treatment.  Elevated liver enzymes She has persistent elevated liver enzymes likely due to fatty liver disease She has lost some weight and her liver enzymes has improved We will observe only for now   Orders Placed This Encounter  Procedures  . CT ABDOMEN PELVIS W CONTRAST    Standing Status:   Future    Standing Expiration Date:   03/24/2020    Order Specific Question:   If indicated for the ordered procedure, I authorize the administration of contrast media per Radiology protocol    Answer:   Yes    Order Specific Question:   Preferred imaging location?    Answer:   [facility]    Order Specific Question:   Radiology Contrast Protocol - do NOT remove file path    Answer:   \\[email or web address]    Order Specific Question:   Is patient pregnant?    Answer:   No  . CT CHEST W CONTRAST    Standing Status:   Future    Standing Expiration Date:   03/24/2020    Order Specific Question:   If indicated for the ordered procedure, I authorize the administration of contrast media per Radiology protocol    Answer:   Yes    Order Specific Question:   Preferred imaging location?    Answer:   [facility]   Order Specific Question:   Radiology Contrast Protocol - do NOT remove file path    Answer:   \\[email or web address]    Order Specific Question:   Is patient pregnant?    Answer:   No    INTERVAL HISTORY: Please see below for problem oriented charting. She returns for further follow-up. 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 8 mm in short axis right level I a lymph node with Deauville 4 activity, increased from prior. 2. Small but mildly hypermetabolic left axillary lymph node with Deauville 4 activity. 3. Splenic activity only minimally greater than that of the liver, but with a moderate amount of splenomegaly (splenic volume 680 cubic cm). No focal splenic lesion observed. 4. Diffuse mildly increased activity in the skeleton without corresponding CT abnormality. Possibilities might include low-level marrow infiltration or granulocyte stimulation.   07/02/2018 - 11/26/2018 Chemotherapy   The patient had rituximab for chemotherapy treatment.  12/30/2018 Cancer Staging   Staging form: Hodgkin and Non-Hodgkin Lymphoma, AJCC 7th Edition - Clinical: S - Spleen, B - Symptoms - Signed by Heath Lark, MD on 12/30/2018     REVIEW OF SYSTEMS:   Constitutional: Denies fevers, chills or abnormal weight loss Eyes: Denies blurriness of vision Ears, nose, mouth, throat, and face: Denies mucositis or sore throat Respiratory: Denies cough, dyspnea or wheezes Cardiovascular: Denies palpitation,  chest discomfort or lower extremity swelling Gastrointestinal:  Denies nausea, heartburn or change in bowel habits Skin: Denies abnormal skin rashes Lymphatics: Denies new lymphadenopathy or easy bruising Neurological:Denies numbness, tingling or new weaknesses Behavioral/Psych: Mood is stable, no new changes  All other systems were reviewed with the patient and are negative.  I have reviewed the past medical history, past surgical history, social history and family history with the patient and they are unchanged from previous note.  ALLERGIES:  has No Known Allergies.  MEDICATIONS:  Current Outpatient Medications  Medication Sig Dispense Refill  . fluticasone (FLONASE) 50 MCG/ACT nasal spray PLACE 1-2 SPRAYS INTO BOTH NOSTRILS DAILY AS NEEDED FOR RHINITIS. 16 g 1  . furosemide (LASIX) 20 MG tablet Take 1 tablet (20 mg total) by mouth daily. 30 tablet 9  . ibuprofen (ADVIL,MOTRIN) 200 MG tablet Take 400-800 mg by mouth every 6 (six) hours as needed for headache, mild pain or moderate pain.     . pantoprazole (PROTONIX) 20 MG tablet TAKE 1 TABLET BY MOUTH EVERY DAY 90 tablet 2  . Potassium Chloride ER 20 MEQ TBCR TAKE 1 TABLET BY MOUTH EVERY DAY 90 tablet 1   No current facility-administered medications for this visit.     PHYSICAL EXAMINATION: ECOG PERFORMANCE STATUS: 0 - Asymptomatic  Vitals:   03/25/19 0900  BP: 137/83  Pulse: 69  Resp: 18  Temp: 98.7 F (37.1 C)  SpO2: 100%   Filed Weights   03/25/19 0900  Weight: 211 lb 3.2 oz (95.8 kg)    GENERAL:alert, no distress and comfortable SKIN: skin color, texture, turgor are normal, no rashes or significant lesions EYES: normal, Conjunctiva are pink and non-injected, sclera clear OROPHARYNX:no exudate, no erythema and lips, buccal mucosa, and tongue normal  NECK: supple, thyroid normal size, non-tender, without nodularity LYMPH:  no palpable lymphadenopathy in the cervical, axillary or inguinal LUNGS: clear to  auscultation and percussion with normal breathing effort HEART: regular rate & rhythm and no murmurs and no lower extremity edema ABDOMEN:abdomen soft, non-tender and normal bowel sounds Musculoskeletal:no cyanosis of digits and no clubbing  NEURO: alert & oriented x 3 with fluent speech, no focal motor/sensory deficits  LABORATORY DATA:  I have reviewed the data as listed    Component Value Date/Time   NA 139 03/25/2019 0929   NA 138 12/08/2018 0931   NA 139 01/30/2015 0924   K 3.8 03/25/2019 0929   K 4.8 01/30/2015 0924   CL 107 03/25/2019 0929   CO2 23 03/25/2019 0929   CO2 25 01/30/2015 0924   GLUCOSE 103 (H) 03/25/2019 0929   GLUCOSE 101 01/30/2015 0924   BUN 11 03/25/2019 0929   BUN 12 12/08/2018 0931   BUN 11.5 01/30/2015 0924   CREATININE 0.82 03/25/2019 0929   CREATININE 0.8 01/30/2015 0924   CALCIUM 8.5 (L) 03/25/2019 0929   CALCIUM 9.1 01/30/2015 0924   PROT 6.5 03/25/2019 0929   PROT 5.8 (L) 12/08/2018 0931   PROT 7.0 01/30/2015 0924   ALBUMIN 4.1 03/25/2019 0929   ALBUMIN 4.1 12/08/2018 0931  ALBUMIN 4.2 01/30/2015 0924   AST 43 (H) 03/25/2019 0929   AST 21 01/30/2015 0924   ALT 95 (H) 03/25/2019 0929   ALT 27 01/30/2015 0924   ALKPHOS 74 03/25/2019 0929   ALKPHOS 77 01/30/2015 0924   BILITOT 0.9 03/25/2019 0929   BILITOT 0.6 12/08/2018 0931   BILITOT 0.48 01/30/2015 0924   GFRNONAA >60 03/25/2019 0929   GFRNONAA 76 02/14/2014 1105   GFRAA >60 03/25/2019 0929   GFRAA 88 02/14/2014 1105    No results found for: SPEP, UPEP  Lab Results  Component Value Date   WBC 3.6 (L) 03/25/2019   NEUTROABS 2.6 03/25/2019   HGB 13.3 03/25/2019   HCT 37.9 03/25/2019   MCV 88.1 03/25/2019   PLT 178 03/25/2019      Chemistry      Component Value Date/Time   NA 139 03/25/2019 0929   NA 138 12/08/2018 0931   NA 139 01/30/2015 0924   K 3.8 03/25/2019 0929   K 4.8 01/30/2015 0924   CL 107 03/25/2019 0929   CO2 23 03/25/2019 0929   CO2 25 01/30/2015 0924    BUN 11 03/25/2019 0929   BUN 12 12/08/2018 0931   BUN 11.5 01/30/2015 0924   CREATININE 0.82 03/25/2019 0929   CREATININE 0.8 01/30/2015 0924      Component Value Date/Time   CALCIUM 8.5 (L) 03/25/2019 0929   CALCIUM 9.1 01/30/2015 0924   ALKPHOS 74 03/25/2019 0929   ALKPHOS 77 01/30/2015 0924   AST 43 (H) 03/25/2019 0929   AST 21 01/30/2015 0924   ALT 95 (H) 03/25/2019 0929   ALT 27 01/30/2015 0924   BILITOT 0.9 03/25/2019 0929   BILITOT 0.6 12/08/2018 0931   BILITOT 0.48 01/30/2015 0924      All questions were answered. The patient knows to call the clinic with any problems, questions or concerns. No barriers to learning was detected.  I spent 15 minutes counseling the patient face to face. The total time spent in the appointment was 20 minutes and more than 50% was on counseling and review of test results  Heath Lark, MD 03/25/2019 11:14 AM

## 2019-03-25 NOTE — Assessment & Plan Note (Signed)
She has persistent elevated liver enzymes likely due to fatty liver disease She has lost some weight and her liver enzymes has improved We will observe only for now

## 2019-03-26 LAB — IGM: IgM (Immunoglobulin M), Srm: 25 mg/dL — ABNORMAL LOW (ref 26–217)

## 2019-03-28 ENCOUNTER — Telehealth: Payer: Self-pay | Admitting: Hematology and Oncology

## 2019-03-28 NOTE — Telephone Encounter (Signed)
I could not reach patient, mailbox was full will mail

## 2019-04-27 ENCOUNTER — Other Ambulatory Visit: Payer: Self-pay | Admitting: Hematology and Oncology

## 2019-04-28 NOTE — Telephone Encounter (Signed)
Future refill through PCP

## 2019-05-26 ENCOUNTER — Inpatient Hospital Stay: Payer: Commercial Managed Care - PPO | Attending: Hematology and Oncology

## 2019-05-26 ENCOUNTER — Ambulatory Visit (HOSPITAL_COMMUNITY)
Admission: RE | Admit: 2019-05-26 | Discharge: 2019-05-26 | Disposition: A | Discharge status: Home / Self Care | Payer: Commercial Managed Care - PPO | Source: Ambulatory Visit | Attending: Hematology and Oncology | Admitting: Hematology and Oncology

## 2019-05-26 ENCOUNTER — Other Ambulatory Visit: Payer: Self-pay | Admitting: Hematology and Oncology

## 2019-05-26 ENCOUNTER — Other Ambulatory Visit: Payer: Self-pay

## 2019-05-26 ENCOUNTER — Encounter (HOSPITAL_COMMUNITY): Payer: Self-pay

## 2019-05-26 DIAGNOSIS — R748 Abnormal levels of other serum enzymes: Secondary | ICD-10-CM | POA: Insufficient documentation

## 2019-05-26 DIAGNOSIS — Z791 Long term (current) use of non-steroidal anti-inflammatories (NSAID): Secondary | ICD-10-CM | POA: Diagnosis not present

## 2019-05-26 DIAGNOSIS — Z79899 Other long term (current) drug therapy: Secondary | ICD-10-CM | POA: Insufficient documentation

## 2019-05-26 DIAGNOSIS — Z9221 Personal history of antineoplastic chemotherapy: Secondary | ICD-10-CM | POA: Diagnosis not present

## 2019-05-26 DIAGNOSIS — Z5112 Encounter for antineoplastic immunotherapy: Secondary | ICD-10-CM | POA: Insufficient documentation

## 2019-05-26 DIAGNOSIS — D472 Monoclonal gammopathy: Secondary | ICD-10-CM

## 2019-05-26 DIAGNOSIS — T451X5A Adverse effect of antineoplastic and immunosuppressive drugs, initial encounter: Secondary | ICD-10-CM | POA: Insufficient documentation

## 2019-05-26 DIAGNOSIS — C8307 Small cell B-cell lymphoma, spleen: Secondary | ICD-10-CM | POA: Diagnosis present

## 2019-05-26 DIAGNOSIS — D701 Agranulocytosis secondary to cancer chemotherapy: Secondary | ICD-10-CM | POA: Insufficient documentation

## 2019-05-26 LAB — COMPREHENSIVE METABOLIC PANEL
ALT: 93 U/L — ABNORMAL HIGH (ref 0–44)
AST: 43 U/L — ABNORMAL HIGH (ref 15–41)
Albumin: 4 g/dL (ref 3.5–5.0)
Alkaline Phosphatase: 75 U/L (ref 38–126)
Anion gap: 9 (ref 5–15)
BUN: 14 mg/dL (ref 6–20)
CO2: 26 mmol/L (ref 22–32)
Calcium: 9 mg/dL (ref 8.9–10.3)
Chloride: 103 mmol/L (ref 98–111)
Creatinine, Ser: 0.97 mg/dL (ref 0.44–1.00)
GFR calc Af Amer: >60 mL/min (ref >60)
GFR calc non Af Amer: >60 mL/min (ref >60)
Glucose, Bld: 98 mg/dL (ref 70–99)
Potassium: 4.4 mmol/L (ref 3.5–5.1)
Sodium: 138 mmol/L (ref 135–145)
Total Bilirubin: 1 mg/dL (ref 0.3–1.2)
Total Protein: 6.4 g/dL — ABNORMAL LOW (ref 6.5–8.1)

## 2019-05-26 LAB — CBC WITH DIFFERENTIAL/PLATELET
Abs Immature Granulocytes: 0.02 10*3/uL (ref 0.00–0.07)
Basophils Absolute: 0 10*3/uL (ref 0.0–0.1)
Basophils Relative: 1 %
Eosinophils Absolute: 0.1 10*3/uL (ref 0.0–0.5)
Eosinophils Relative: 2 %
HCT: 38.6 % (ref 36.0–46.0)
Hemoglobin: 13.6 g/dL (ref 12.0–15.0)
Immature Granulocytes: 1 %
Lymphocytes Relative: 14 %
Lymphs Abs: 0.6 10*3/uL — ABNORMAL LOW (ref 0.7–4.0)
MCH: 31 pg (ref 26.0–34.0)
MCHC: 35.2 g/dL (ref 30.0–36.0)
MCV: 87.9 fL (ref 80.0–100.0)
Monocytes Absolute: 0.3 10*3/uL (ref 0.1–1.0)
Monocytes Relative: 7 %
Neutro Abs: 3.3 10*3/uL (ref 1.7–7.7)
Neutrophils Relative %: 75 %
Platelets: 193 10*3/uL (ref 150–400)
RBC: 4.39 MIL/uL (ref 3.87–5.11)
RDW: 12.9 % (ref 11.5–15.5)
WBC: 4.4 10*3/uL (ref 4.0–10.5)
nRBC: 0 % (ref 0.0–0.2)

## 2019-05-26 LAB — LACTATE DEHYDROGENASE: LDH: 171 U/L (ref 98–192)

## 2019-05-26 MED ORDER — IOHEXOL 300 MG/ML  SOLN
100.0000 mL | Freq: Once | INTRAMUSCULAR | Status: AC | PRN
  Administered 2019-05-26: 100 mL via INTRAVENOUS

## 2019-05-26 MED ORDER — SODIUM CHLORIDE (PF) 0.9 % IJ SOLN
INTRAMUSCULAR | Status: AC
  Filled 2019-05-26: qty 50

## 2019-05-27 ENCOUNTER — Inpatient Hospital Stay (HOSPITAL_BASED_OUTPATIENT_CLINIC_OR_DEPARTMENT_OTHER): Payer: Commercial Managed Care - PPO | Admitting: Hematology and Oncology

## 2019-05-27 ENCOUNTER — Telehealth: Payer: Self-pay | Admitting: Hematology and Oncology

## 2019-05-27 ENCOUNTER — Inpatient Hospital Stay: Payer: Commercial Managed Care - PPO

## 2019-05-27 ENCOUNTER — Other Ambulatory Visit: Payer: Self-pay

## 2019-05-27 ENCOUNTER — Encounter: Payer: Self-pay | Admitting: Hematology and Oncology

## 2019-05-27 VITALS — BP 148/70 | HR 72 | Temp 97.9°F | Resp 17

## 2019-05-27 DIAGNOSIS — R748 Abnormal levels of other serum enzymes: Secondary | ICD-10-CM | POA: Diagnosis not present

## 2019-05-27 DIAGNOSIS — C8307 Small cell B-cell lymphoma, spleen: Secondary | ICD-10-CM

## 2019-05-27 DIAGNOSIS — D591 Autoimmune hemolytic anemia, unspecified: Secondary | ICD-10-CM

## 2019-05-27 LAB — IGM: IgM (Immunoglobulin M), Srm: 21 mg/dL — ABNORMAL LOW (ref 26–217)

## 2019-05-27 MED ORDER — DIPHENHYDRAMINE HCL 25 MG PO CAPS
25.0000 mg | ORAL_CAPSULE | Freq: Once | ORAL | Status: AC
  Administered 2019-05-27: 25 mg via ORAL

## 2019-05-27 MED ORDER — ACETAMINOPHEN 325 MG PO TABS
650.0000 mg | ORAL_TABLET | Freq: Once | ORAL | Status: AC
  Administered 2019-05-27: 650 mg via ORAL

## 2019-05-27 MED ORDER — DIPHENHYDRAMINE HCL 25 MG PO CAPS
ORAL_CAPSULE | ORAL | Status: AC
  Filled 2019-05-27: qty 1

## 2019-05-27 MED ORDER — SODIUM CHLORIDE 0.9 % IV SOLN
375.0000 mg/m2 | Freq: Once | INTRAVENOUS | Status: AC
  Administered 2019-05-27: 800 mg via INTRAVENOUS
  Filled 2019-05-27: qty 50

## 2019-05-27 MED ORDER — SODIUM CHLORIDE 0.9 % IV SOLN
Freq: Once | INTRAVENOUS | Status: AC
  Administered 2019-05-27: 12:00:00 via INTRAVENOUS
  Filled 2019-05-27: qty 250

## 2019-05-27 MED ORDER — ACETAMINOPHEN 325 MG PO TABS
ORAL_TABLET | ORAL | Status: AC
  Filled 2019-05-27: qty 2

## 2019-05-27 MED ORDER — METHYLPREDNISOLONE SODIUM SUCC 40 MG IJ SOLR
INTRAMUSCULAR | Status: AC
  Filled 2019-05-27: qty 1

## 2019-05-27 MED ORDER — METHYLPREDNISOLONE SODIUM SUCC 40 MG IJ SOLR
40.0000 mg | Freq: Once | INTRAMUSCULAR | Status: AC
  Administered 2019-05-27: 40 mg via INTRAVENOUS

## 2019-05-27 NOTE — Assessment & Plan Note (Signed)
She has persistent elevated liver enzymes likely due to fatty liver disease She has lost some weight and her liver enzymes has improved We will observe only for now We discussed importance of getting close follow-up with primary care doctor and get cholesterol checked

## 2019-05-27 NOTE — Progress Notes (Signed)
Ok to treat today per Dr. Alvy Bimler with labs reviewed.

## 2019-05-27 NOTE — Progress Notes (Signed)
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  Elevated liver enzymes She has persistent elevated liver enzymes likely due to fatty liver disease She has lost some weight and her liver enzymes has improved We will observe only for now We discussed importance of getting close follow-up with primary care doctor and get cholesterol checked   No orders of the defined types were placed in this encounter.   INTERVAL HISTORY: Please see below for problem oriented charting. 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 8 mm in short axis right level I a lymph node with Deauville 4 activity, increased from prior. 2. Small but mildly hypermetabolic left axillary lymph node with Deauville 4 activity. 3. Splenic activity only minimally greater than that of the liver, but with a moderate amount of splenomegaly (splenic volume 680 cubic cm). No focal splenic lesion observed. 4. Diffuse mildly increased activity in the skeleton without corresponding CT abnormality. Possibilities might include low-level marrow  infiltration or granulocyte stimulation.   07/02/2018 - 11/26/2018 Chemotherapy   The patient had rituximab for chemotherapy treatment.     07/02/2018 - 11/26/2018 Chemotherapy   The patient had rituximab for chemotherapy treatment.     12/30/2018 Cancer Staging   Staging form: Hodgkin and Non-Hodgkin Lymphoma, AJCC 7th Edition - Clinical: S - Spleen, B - Symptoms - Signed by Heath Lark, MD on 12/30/2018   05/26/2019 Imaging   1. No findings in the chest, abdomen or pelvis to suggest recurrent disease. 2. No acute findings in the chest, abdomen or pelvis. 3. Mild atherosclerosis in the pelvic vasculature. 4. Additional incidental findings, as above.     REVIEW OF SYSTEMS:   Constitutional: Denies fevers, chills or abnormal weight loss Eyes: Denies blurriness of vision Ears, nose, mouth, throat, and face: Denies mucositis or sore throat Respiratory: Denies cough, dyspnea or wheezes Cardiovascular: Denies palpitation, chest discomfort or lower extremity swelling Gastrointestinal:  Denies nausea, heartburn or change in bowel habits Skin: Denies abnormal skin rashes Lymphatics: Denies new lymphadenopathy or easy bruising Neurological:Denies numbness, tingling or new weaknesses Behavioral/Psych: Mood is stable, no new changes  All other systems were reviewed with the patient and are negative.  I have reviewed the past medical history, past surgical history, social history and family history with the patient and they are unchanged from previous note.  ALLERGIES:  has No Known Allergies.  MEDICATIONS:  Current Outpatient Medications  Medication Sig Dispense Refill  . fluticasone (FLONASE) 50 MCG/ACT nasal spray PLACE 1-2 SPRAYS INTO BOTH NOSTRILS DAILY AS NEEDED FOR RHINITIS. 16 mL 1  . furosemide (LASIX) 20 MG tablet Take 1 tablet (20 mg total) by mouth daily.  30 tablet 9  . ibuprofen (ADVIL,MOTRIN) 200 MG tablet Take 400-800 mg by mouth every 6 (six) hours as needed for headache,  mild pain or moderate pain.     . pantoprazole (PROTONIX) 20 MG tablet TAKE 1 TABLET BY MOUTH EVERY DAY 90 tablet 2  . Potassium Chloride ER 20 MEQ TBCR TAKE 1 TABLET BY MOUTH EVERY DAY 90 tablet 1   No current facility-administered medications for this visit.     PHYSICAL EXAMINATION: ECOG PERFORMANCE STATUS: 0 - Asymptomatic  Vitals:   05/27/19 1011  BP: (!) 131/50  Pulse: 69  Resp: 18  Temp: 98.3 F (36.8 C)  SpO2: 99%   Filed Weights   05/27/19 1011  Weight: 210 lb (95.3 kg)    GENERAL:alert, no distress and comfortable SKIN: skin color, texture, turgor are normal, no rashes or significant lesions EYES: normal, Conjunctiva are pink and non-injected, sclera clear OROPHARYNX:no exudate, no erythema and lips, buccal mucosa, and tongue normal  NECK: supple, thyroid normal size, non-tender, without nodularity LYMPH:  no palpable lymphadenopathy in the cervical, axillary or inguinal LUNGS: clear to auscultation and percussion with normal breathing effort HEART: regular rate & rhythm and no murmurs and no lower extremity edema ABDOMEN:abdomen soft, non-tender and normal bowel sounds Musculoskeletal:no cyanosis of digits and no clubbing  NEURO: alert & oriented x 3 with fluent speech, no focal motor/sensory deficits  LABORATORY DATA:  I have reviewed the data as listed    Component Value Date/Time   NA 138 05/26/2019 1043   NA 138 12/08/2018 0931   NA 139 01/30/2015 0924   K 4.4 05/26/2019 1043   K 4.8 01/30/2015 0924   CL 103 05/26/2019 1043   CO2 26 05/26/2019 1043   CO2 25 01/30/2015 0924   GLUCOSE 98 05/26/2019 1043   GLUCOSE 101 01/30/2015 0924   BUN 14 05/26/2019 1043   BUN 12 12/08/2018 0931   BUN 11.5 01/30/2015 0924   CREATININE 0.97 05/26/2019 1043   CREATININE 0.8 01/30/2015 0924   CALCIUM 9.0 05/26/2019 1043   CALCIUM 9.1 01/30/2015 0924   PROT 6.4 (L) 05/26/2019 1043   PROT 5.8 (L) 12/08/2018 0931   PROT 7.0 01/30/2015 0924   ALBUMIN 4.0 05/26/2019  1043   ALBUMIN 4.1 12/08/2018 0931   ALBUMIN 4.2 01/30/2015 0924   AST 43 (H) 05/26/2019 1043   AST 21 01/30/2015 0924   ALT 93 (H) 05/26/2019 1043   ALT 27 01/30/2015 0924   ALKPHOS 75 05/26/2019 1043   ALKPHOS 77 01/30/2015 0924   BILITOT 1.0 05/26/2019 1043   BILITOT 0.6 12/08/2018 0931   BILITOT 0.48 01/30/2015 0924   GFRNONAA >60 05/26/2019 1043   GFRNONAA 76 02/14/2014 1105   GFRAA >60 05/26/2019 1043   GFRAA 88 02/14/2014 1105    No results found for: SPEP, UPEP  Lab Results  Component Value Date   WBC 4.4 05/26/2019   NEUTROABS 3.3 05/26/2019   HGB 13.6 05/26/2019   HCT 38.6 05/26/2019   MCV 87.9 05/26/2019   PLT 193 05/26/2019      Chemistry      Component Value Date/Time   NA 138 05/26/2019 1043   NA 138 12/08/2018 0931   NA 139 01/30/2015 0924   K 4.4 05/26/2019 1043   K 4.8 01/30/2015 0924   CL 103 05/26/2019 1043   CO2 26 05/26/2019 1043   CO2 25 01/30/2015 0924   BUN 14 05/26/2019 1043   BUN 12 12/08/2018 0931   BUN 11.5  01/30/2015 0924   CREATININE 0.97 05/26/2019 1043   CREATININE 0.8 01/30/2015 0924      Component Value Date/Time   CALCIUM 9.0 05/26/2019 1043   CALCIUM 9.1 01/30/2015 0924   ALKPHOS 75 05/26/2019 1043   ALKPHOS 77 01/30/2015 0924   AST 43 (H) 05/26/2019 1043   AST 21 01/30/2015 0924   ALT 93 (H) 05/26/2019 1043   ALT 27 01/30/2015 0924   BILITOT 1.0 05/26/2019 1043   BILITOT 0.6 12/08/2018 0931   BILITOT 0.48 01/30/2015 0924       RADIOGRAPHIC STUDIES: I have reviewed multiple imaging studies with the patient I have personally reviewed the radiological images as listed and agreed with the findings in the report. Ct Chest W Contrast  Result Date: 05/26/2019 CLINICAL DATA:  49 year old female with history of marginal zone lymphoma status post completion of chemotherapy. Immunotherapy ongoing. EXAM: CT CHEST, ABDOMEN, AND PELVIS WITH CONTRAST TECHNIQUE: Multidetector CT imaging of the chest, abdomen and pelvis was  performed following the standard protocol during bolus administration of intravenous contrast. CONTRAST:  119m OMNIPAQUE IOHEXOL 300 MG/ML  SOLN COMPARISON:  PET-CT 05/21/2018. CT the abdomen and pelvis 04/23/2016. Chest CT 08/02/2015. FINDINGS: CT CHEST FINDINGS Cardiovascular: Heart size is normal. There is no significant pericardial fluid, thickening or pericardial calcification. No atherosclerotic calcifications in the thoracic aorta or the coronary arteries. Mediastinum/Nodes: No pathologically enlarged mediastinal or hilar lymph nodes. Esophagus is unremarkable in appearance. No axillary lymphadenopathy. Lungs/Pleura: No suspicious appearing pulmonary nodules or masses. No acute consolidative airspace disease. No pleural effusions. Musculoskeletal: There are no aggressive appearing lytic or blastic lesions noted in the visualized portions of the skeleton. CT ABDOMEN PELVIS FINDINGS Hepatobiliary: No suspicious cystic or solid hepatic lesions. No intra or extrahepatic biliary ductal dilatation. Status post cholecystectomy. Pancreas: No pancreatic mass. No pancreatic ductal dilatation. No pancreatic or peripancreatic fluid collections or inflammatory changes. Spleen: Unremarkable. Adrenals/Urinary Tract: Bilateral kidneys and adrenal glands are normal in appearance. No hydroureteronephrosis. Urinary bladder is nearly completely decompressed, but otherwise unremarkable in appearance. Stomach/Bowel: Normal appearance of the stomach. No pathologic dilatation of small bowel or colon. Normal appendix. Vascular/Lymphatic: No significant aortic atherosclerosis. Mild atherosclerosis in the pelvic vasculature. No lymphadenopathy noted in the abdomen or pelvis. Reproductive: Uterus and ovaries are unremarkable in appearance. Other: No significant volume of ascites.  No pneumoperitoneum. Musculoskeletal: There are no aggressive appearing lytic or blastic lesions noted in the visualized portions of the skeleton.  IMPRESSION: 1. No findings in the chest, abdomen or pelvis to suggest recurrent disease. 2. No acute findings in the chest, abdomen or pelvis. 3. Mild atherosclerosis in the pelvic vasculature. 4. Additional incidental findings, as above. Electronically Signed   By: DVinnie LangtonM.D.   On: 05/26/2019 14:28   Ct Abdomen Pelvis W Contrast  Result Date: 05/26/2019 CLINICAL DATA:  48year old female with history of marginal zone lymphoma status post completion of chemotherapy. Immunotherapy ongoing. EXAM: CT CHEST, ABDOMEN, AND PELVIS WITH CONTRAST TECHNIQUE: Multidetector CT imaging of the chest, abdomen and pelvis was performed following the standard protocol during bolus administration of intravenous contrast. CONTRAST:  1054mOMNIPAQUE IOHEXOL 300 MG/ML  SOLN COMPARISON:  PET-CT 05/21/2018. CT the abdomen and pelvis 04/23/2016. Chest CT 08/02/2015. FINDINGS: CT CHEST FINDINGS Cardiovascular: Heart size is normal. There is no significant pericardial fluid, thickening or pericardial calcification. No atherosclerotic calcifications in the thoracic aorta or the coronary arteries. Mediastinum/Nodes: No pathologically enlarged mediastinal or hilar lymph nodes. Esophagus is unremarkable in appearance. No axillary lymphadenopathy. Lungs/Pleura:  No suspicious appearing pulmonary nodules or masses. No acute consolidative airspace disease. No pleural effusions. Musculoskeletal: There are no aggressive appearing lytic or blastic lesions noted in the visualized portions of the skeleton. CT ABDOMEN PELVIS FINDINGS Hepatobiliary: No suspicious cystic or solid hepatic lesions. No intra or extrahepatic biliary ductal dilatation. Status post cholecystectomy. Pancreas: No pancreatic mass. No pancreatic ductal dilatation. No pancreatic or peripancreatic fluid collections or inflammatory changes. Spleen: Unremarkable. Adrenals/Urinary Tract: Bilateral kidneys and adrenal glands are normal in appearance. No hydroureteronephrosis.  Urinary bladder is nearly completely decompressed, but otherwise unremarkable in appearance. Stomach/Bowel: Normal appearance of the stomach. No pathologic dilatation of small bowel or colon. Normal appendix. Vascular/Lymphatic: No significant aortic atherosclerosis. Mild atherosclerosis in the pelvic vasculature. No lymphadenopathy noted in the abdomen or pelvis. Reproductive: Uterus and ovaries are unremarkable in appearance. Other: No significant volume of ascites.  No pneumoperitoneum. Musculoskeletal: There are no aggressive appearing lytic or blastic lesions noted in the visualized portions of the skeleton. IMPRESSION: 1. No findings in the chest, abdomen or pelvis to suggest recurrent disease. 2. No acute findings in the chest, abdomen or pelvis. 3. Mild atherosclerosis in the pelvic vasculature. 4. Additional incidental findings, as above. Electronically Signed   By: Vinnie Langton M.D.   On: 05/26/2019 14:28    All questions were answered. The patient knows to call the clinic with any problems, questions or concerns. No barriers to learning was detected.  I spent 15 minutes counseling the patient face to face. The total time spent in the appointment was 20 minutes and more than 50% was on counseling and review of test results  Heath Lark, MD 05/27/2019 4:06 PM

## 2019-05-27 NOTE — Assessment & Plan Note (Signed)
She tolerated rituximab well with resolution of autoimmune hemolytic anemia We will continue treatment every 60 days with premedication CT imaging showed no evidence of splenomegaly or recurrence of lymphoma My plan would be to continue rituximab for 2 years.

## 2019-05-27 NOTE — Patient Instructions (Signed)
[facility] Discharge Instructions for Patients Receiving Chemotherapy  Today you received the following agents Rituxan     If you develop nausea and vomiting that is not controlled by your nausea medication, call the clinic.   BELOW ARE SYMPTOMS THAT SHOULD BE REPORTED IMMEDIATELY:  *FEVER GREATER THAN 100.5 F  *CHILLS WITH OR WITHOUT FEVER  NAUSEA AND VOMITING THAT IS NOT CONTROLLED WITH YOUR NAUSEA MEDICATION  *UNUSUAL SHORTNESS OF BREATH  *UNUSUAL BRUISING OR BLEEDING  TENDERNESS IN MOUTH AND THROAT WITH OR WITHOUT PRESENCE OF ULCERS  *URINARY PROBLEMS  *BOWEL PROBLEMS  UNUSUAL RASH Items with * indicate a potential emergency and should be followed up as soon as possible.  Feel free to call the clinic should you have any questions or concerns. The clinic phone number is [phone].  Please show the [facility] at check-in to the Emergency Department and triage nurse.   Rituximab injection What is this medicine? RITUXIMAB (ri TUX i mab) is a monoclonal antibody. It is used to treat certain types of cancer like non-Hodgkin lymphoma and chronic lymphocytic leukemia. It is also used to treat rheumatoid arthritis, granulomatosis with polyangiitis (or Wegener's granulomatosis), microscopic polyangiitis, and pemphigus vulgaris. This medicine may be used for other purposes; ask your health care provider or pharmacist if you have questions. COMMON BRAND NAME(S): Rituxan What should I tell my health care provider before I take this medicine? They need to know if you have any of these conditions: -heart disease -infection (especially a virus infection such as hepatitis B, chickenpox, cold sores, or herpes) -immune system problems -irregular heartbeat -kidney disease -low blood counts, like low white cell, platelet, or red cell counts -lung or breathing disease, like asthma -recently received or scheduled to receive a vaccine -an unusual or allergic  reaction to rituximab, other medicines, foods, dyes, or preservatives -pregnant or trying to get pregnant -breast-feeding How should I use this medicine? This medicine is for infusion into a vein. It is administered in a hospital or clinic by a specially trained health care professional. A special MedGuide will be given to you by the pharmacist with each prescription and refill. Be sure to read this information carefully each time. Talk to your pediatrician regarding the use of this medicine in children. This medicine is not approved for use in children. Overdosage: If you think you have taken too much of this medicine contact a poison control center or emergency room at once. NOTE: This medicine is only for you. Do not share this medicine with others. What if I miss a dose? It is important not to miss a dose. Call your doctor or health care professional if you are unable to keep an appointment. What may interact with this medicine? -cisplatin -live virus vaccines This list may not describe all possible interactions. Give your health care provider a list of all the medicines, herbs, non-prescription drugs, or dietary supplements you use. Also tell them if you smoke, drink alcohol, or use illegal drugs. Some items may interact with your medicine. What should I watch for while using this medicine? Your condition will be monitored carefully while you are receiving this medicine. You may need blood work done while you are taking this medicine. This medicine can cause serious allergic reactions. To reduce your risk you may need to take medicine before treatment with this medicine. Take your medicine as directed. In some patients, this medicine may cause a serious brain infection that may cause death. If you have  any problems seeing, thinking, speaking, walking, or standing, tell your healthcare professional right away. If you cannot reach your healthcare professional, urgently seek other source of  medical care. Call your doctor or health care professional for advice if you get a fever, chills or sore throat, or other symptoms of a cold or flu. Do not treat yourself. This drug decreases your body's ability to fight infections. Try to avoid being around people who are sick. Do not become pregnant while taking this medicine or for 12 months after stopping it. Women should inform their doctor if they wish to become pregnant or think they might be pregnant. There is a potential for serious side effects to an unborn child. Talk to your health care professional or pharmacist for more information. Do not breast-feed an infant while taking this medicine or for 6 months after stopping it. What side effects may I notice from receiving this medicine? Side effects that you should report to your doctor or health care professional as soon as possible: -allergic reactions like skin rash, itching or hives; swelling of the face, lips, or tongue -breathing problems -chest pain -changes in vision -diarrhea -headache with fever, neck stiffness, sensitivity to light, nausea, or confusion -fast, irregular heartbeat -loss of memory -low blood counts - this medicine may decrease the number of white blood cells, red blood cells and platelets. You may be at increased risk for infections and bleeding. -mouth sores -problems with balance, talking, or walking -redness, blistering, peeling or loosening of the skin, including inside the mouth -signs of infection - fever or chills, cough, sore throat, pain or difficulty passing urine -signs and symptoms of kidney injury like trouble passing urine or change in the amount of urine -signs and symptoms of liver injury like dark yellow or brown urine; general ill feeling or flu-like symptoms; light-colored stools; loss of appetite; nausea; right upper belly pain; unusually weak or tired; yellowing of the eyes or skin -signs and symptoms of low blood pressure like dizziness;  feeling faint or lightheaded, falls; unusually weak or tired -stomach pain -swelling of the ankles, feet, hands -unusual bleeding or bruising -vomiting Side effects that usually do not require medical attention (report to your doctor or health care professional if they continue or are bothersome): -headache -joint pain -muscle cramps or muscle pain -nausea -tiredness This list may not describe all possible side effects. Call your doctor for medical advice about side effects. You may report side effects to FDA at 1-800-FDA-1088. Where should I keep my medicine? This drug is given in a hospital or clinic and will not be stored at home. NOTE: This sheet is a summary. It may not cover all possible information. If you have questions about this medicine, talk to your doctor, pharmacist, or health care provider.  2019 Elsevier/Gold Standard (2017-09-11 13:04:32)

## 2019-05-27 NOTE — Telephone Encounter (Signed)
I talk with patient regarding schedule  

## 2019-06-08 ENCOUNTER — Encounter: Payer: Self-pay | Admitting: Hematology and Oncology

## 2019-06-09 ENCOUNTER — Telehealth: Payer: Self-pay

## 2019-06-09 ENCOUNTER — Other Ambulatory Visit: Payer: Self-pay | Admitting: Hematology and Oncology

## 2019-06-09 NOTE — Telephone Encounter (Signed)
Attempting to call and give her the below message regarding refill request on Fluticasone. Voicemail full. I have told her in the past that I will not be refilling this prescription long term  It is not a medication to be used long term  Due to presence of steroids, it can contribute to long term weight gain  If she still have long term nasal drainage, she should be evaluated by ENT  I will not refill this medication. Please call her first and then tell pharmacy to stop sending refills here

## 2019-06-09 NOTE — Telephone Encounter (Signed)
I have told her in the past that I will not be refilling this prescription long term It is not a medication to be used long term Due to presence of steroids, it can contribute to long term weight gain If she still have long term nasal drainage, she should be evaluated by ENT I will not refill this medication. Please call her first and then tell pharmacy to stop sending refills here

## 2019-06-09 NOTE — Telephone Encounter (Signed)
Called and given below message. She did not request a refill. It must have been a auto request. She will reach out to PCP if refill needed.

## 2019-07-29 ENCOUNTER — Other Ambulatory Visit: Payer: Self-pay

## 2019-07-29 ENCOUNTER — Inpatient Hospital Stay (HOSPITAL_BASED_OUTPATIENT_CLINIC_OR_DEPARTMENT_OTHER): Payer: Commercial Managed Care - PPO | Admitting: Hematology and Oncology

## 2019-07-29 ENCOUNTER — Encounter: Payer: Self-pay | Admitting: Hematology and Oncology

## 2019-07-29 ENCOUNTER — Inpatient Hospital Stay: Payer: Commercial Managed Care - PPO

## 2019-07-29 ENCOUNTER — Inpatient Hospital Stay: Payer: Commercial Managed Care - PPO | Attending: Hematology and Oncology

## 2019-07-29 ENCOUNTER — Other Ambulatory Visit: Payer: Self-pay | Admitting: Hematology and Oncology

## 2019-07-29 VITALS — BP 140/86 | HR 78 | Temp 97.8°F | Resp 18

## 2019-07-29 DIAGNOSIS — Z5112 Encounter for antineoplastic immunotherapy: Secondary | ICD-10-CM | POA: Insufficient documentation

## 2019-07-29 DIAGNOSIS — I878 Other specified disorders of veins: Secondary | ICD-10-CM

## 2019-07-29 DIAGNOSIS — Z791 Long term (current) use of non-steroidal anti-inflammatories (NSAID): Secondary | ICD-10-CM | POA: Insufficient documentation

## 2019-07-29 DIAGNOSIS — C8307 Small cell B-cell lymphoma, spleen: Secondary | ICD-10-CM

## 2019-07-29 DIAGNOSIS — R748 Abnormal levels of other serum enzymes: Secondary | ICD-10-CM

## 2019-07-29 DIAGNOSIS — D591 Autoimmune hemolytic anemia, unspecified: Secondary | ICD-10-CM

## 2019-07-29 DIAGNOSIS — Z79899 Other long term (current) drug therapy: Secondary | ICD-10-CM | POA: Diagnosis not present

## 2019-07-29 DIAGNOSIS — Z23 Encounter for immunization: Secondary | ICD-10-CM | POA: Insufficient documentation

## 2019-07-29 DIAGNOSIS — D472 Monoclonal gammopathy: Secondary | ICD-10-CM

## 2019-07-29 LAB — CBC WITH DIFFERENTIAL/PLATELET
Abs Immature Granulocytes: 0.01 10*3/uL (ref 0.00–0.07)
Basophils Absolute: 0 10*3/uL (ref 0.0–0.1)
Basophils Relative: 0 %
Eosinophils Absolute: 0.1 10*3/uL (ref 0.0–0.5)
Eosinophils Relative: 2 %
HCT: 37.2 % (ref 36.0–46.0)
Hemoglobin: 13.3 g/dL (ref 12.0–15.0)
Immature Granulocytes: 0 %
Lymphocytes Relative: 15 %
Lymphs Abs: 0.7 10*3/uL (ref 0.7–4.0)
MCH: 30.9 pg (ref 26.0–34.0)
MCHC: 35.8 g/dL (ref 30.0–36.0)
MCV: 86.5 fL (ref 80.0–100.0)
Monocytes Absolute: 0.3 10*3/uL (ref 0.1–1.0)
Monocytes Relative: 7 %
Neutro Abs: 3.4 10*3/uL (ref 1.7–7.7)
Neutrophils Relative %: 76 %
Platelets: 181 10*3/uL (ref 150–400)
RBC: 4.3 MIL/uL (ref 3.87–5.11)
RDW: 12.5 % (ref 11.5–15.5)
WBC: 4.5 10*3/uL (ref 4.0–10.5)
nRBC: 0 % (ref 0.0–0.2)

## 2019-07-29 LAB — COMPREHENSIVE METABOLIC PANEL
ALT: 84 U/L — ABNORMAL HIGH (ref 0–44)
AST: 35 U/L (ref 15–41)
Albumin: 4 g/dL (ref 3.5–5.0)
Alkaline Phosphatase: 72 U/L (ref 38–126)
Anion gap: 11 (ref 5–15)
BUN: 15 mg/dL (ref 6–20)
CO2: 24 mmol/L (ref 22–32)
Calcium: 8.9 mg/dL (ref 8.9–10.3)
Chloride: 105 mmol/L (ref 98–111)
Creatinine, Ser: 0.93 mg/dL (ref 0.44–1.00)
GFR calc Af Amer: >60 mL/min (ref >60)
GFR calc non Af Amer: >60 mL/min (ref >60)
Glucose, Bld: 111 mg/dL — ABNORMAL HIGH (ref 70–99)
Potassium: 4.3 mmol/L (ref 3.5–5.1)
Sodium: 140 mmol/L (ref 135–145)
Total Bilirubin: 1.1 mg/dL (ref 0.3–1.2)
Total Protein: 6.3 g/dL — ABNORMAL LOW (ref 6.5–8.1)

## 2019-07-29 LAB — LACTATE DEHYDROGENASE: LDH: 180 U/L (ref 98–192)

## 2019-07-29 MED ORDER — DIPHENHYDRAMINE HCL 25 MG PO CAPS
25.0000 mg | ORAL_CAPSULE | Freq: Once | ORAL | Status: AC
  Administered 2019-07-29: 25 mg via ORAL

## 2019-07-29 MED ORDER — METHYLPREDNISOLONE SODIUM SUCC 40 MG IJ SOLR
INTRAMUSCULAR | Status: AC
  Filled 2019-07-29: qty 1

## 2019-07-29 MED ORDER — ACETAMINOPHEN 325 MG PO TABS
ORAL_TABLET | ORAL | Status: AC
  Filled 2019-07-29: qty 2

## 2019-07-29 MED ORDER — INFLUENZA VAC SPLIT QUAD 0.5 ML IM SUSY
0.5000 mL | PREFILLED_SYRINGE | Freq: Once | INTRAMUSCULAR | Status: AC
  Administered 2019-07-29: 0.5 mL via INTRAMUSCULAR

## 2019-07-29 MED ORDER — INFLUENZA VAC SPLIT QUAD 0.5 ML IM SUSY
PREFILLED_SYRINGE | INTRAMUSCULAR | Status: AC
  Filled 2019-07-29: qty 0.5

## 2019-07-29 MED ORDER — METHYLPREDNISOLONE SODIUM SUCC 40 MG IJ SOLR
40.0000 mg | Freq: Once | INTRAMUSCULAR | Status: AC
  Administered 2019-07-29: 40 mg via INTRAVENOUS

## 2019-07-29 MED ORDER — SODIUM CHLORIDE 0.9 % IV SOLN
375.0000 mg/m2 | Freq: Once | INTRAVENOUS | Status: AC
  Administered 2019-07-29: 800 mg via INTRAVENOUS
  Filled 2019-07-29: qty 50

## 2019-07-29 MED ORDER — DIPHENHYDRAMINE HCL 25 MG PO CAPS
ORAL_CAPSULE | ORAL | Status: AC
  Filled 2019-07-29: qty 1

## 2019-07-29 MED ORDER — ACETAMINOPHEN 325 MG PO TABS
650.0000 mg | ORAL_TABLET | Freq: Once | ORAL | Status: AC
  Administered 2019-07-29: 650 mg via ORAL

## 2019-07-29 MED ORDER — SODIUM CHLORIDE 0.9 % IV SOLN
Freq: Once | INTRAVENOUS | Status: AC
  Administered 2019-07-29: 10:00:00 via INTRAVENOUS
  Filled 2019-07-29: qty 250

## 2019-07-29 NOTE — Assessment & Plan Note (Signed)
She tolerated rituximab well with resolution of autoimmune hemolytic anemia We will continue treatment every 60 days with premedication CT imaging showed no evidence of splenomegaly or recurrence of lymphoma The plan is to continue maintenance rituximab for 2 years, until around mid 2021 I do not plan to repeat CT imaging until after she completes her treatment next year I recommend influenza vaccination

## 2019-07-29 NOTE — Assessment & Plan Note (Signed)
She had intermittent history of poor venous access If we continue to encounter this problem, I recommend port placement Previously, she had allergic reaction to subcutaneous formula of rituximab

## 2019-07-29 NOTE — Progress Notes (Signed)
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 We will continue treatment every 60 days with premedication CT imaging showed no evidence of splenomegaly or recurrence of lymphoma The plan is to continue maintenance rituximab for 2 years, until around mid 2021 I do not plan to repeat CT imaging until after she completes her treatment next year I recommend influenza vaccination  Elevated liver enzymes She has persistent elevated liver enzymes likely due to fatty liver disease She has lost some weight and her liver enzymes has improved We will observe only for now and continue lifestyle modification  Poor venous access She had intermittent history of poor venous access If we continue to encounter this problem, I recommend port placement Previously, she had allergic reaction to subcutaneous formula of rituximab   No orders of the defined types were placed in this encounter.   INTERVAL HISTORY: Please see below for problem oriented charting. 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 8 mm in short axis right level I a lymph node with Deauville 4 activity, increased from prior. 2. Small but mildly hypermetabolic left axillary lymph  node with Deauville 4 activity. 3. Splenic activity only minimally greater than that of the liver, but with a moderate amount of splenomegaly (splenic volume 680 cubic cm). No focal splenic lesion observed. 4. Diffuse mildly increased activity in the skeleton without corresponding CT abnormality. Possibilities might include low-level marrow infiltration or granulocyte stimulation.   07/02/2018 - 11/26/2018 Chemotherapy   The patient had rituximab for chemotherapy treatment.     07/02/2018 - 11/26/2018 Chemotherapy   The patient had rituximab for chemotherapy treatment.     12/30/2018 Cancer Staging   Staging form: Hodgkin and Non-Hodgkin Lymphoma, AJCC 7th Edition - Clinical: S - Spleen, B - Symptoms - Signed by Heath Lark, MD on 12/30/2018   05/26/2019 Imaging   1. No findings in the chest, abdomen or pelvis to suggest recurrent disease. 2. No acute findings in the chest, abdomen or pelvis. 3. Mild atherosclerosis in the pelvic vasculature. 4. Additional incidental findings, as above.     REVIEW OF SYSTEMS:   Constitutional: Denies fevers, chills or abnormal weight loss Eyes: Denies blurriness of vision Ears, nose, mouth, throat, and face: Denies mucositis or sore throat Respiratory: Denies cough, dyspnea or wheezes Cardiovascular: Denies palpitation, chest discomfort or lower extremity swelling Gastrointestinal:  Denies nausea, heartburn or change in bowel habits Skin: Denies abnormal skin rashes Lymphatics: Denies new lymphadenopathy or easy bruising Neurological:Denies numbness, tingling or new weaknesses Behavioral/Psych: Mood is stable, no new changes  All other systems were reviewed with the patient and are negative.  I have reviewed the past medical history, past surgical history, social history and family history with the patient and they are unchanged from previous note.  ALLERGIES:  has No Known Allergies.  MEDICATIONS:  Current Outpatient Medications  Medication Sig  Dispense Refill  . fluticasone (FLONASE) 50 MCG/ACT nasal spray PLACE 1-2 SPRAYS INTO BOTH NOSTRILS DAILY AS NEEDED FOR RHINITIS. 16 mL 1  . furosemide (LASIX) 20 MG tablet Take 1 tablet (20 mg total) by mouth daily. 30 tablet 9  . ibuprofen (ADVIL,MOTRIN) 200 MG tablet Take 400-800 mg by mouth every 6 (six) hours as needed for headache, mild pain or moderate pain.     . pantoprazole (PROTONIX) 20 MG tablet TAKE 1 TABLET BY MOUTH EVERY DAY 90 tablet 2  . Potassium Chloride ER 20 MEQ TBCR TAKE 1 TABLET BY MOUTH EVERY DAY 90 tablet 1   No current facility-administered medications for this visit.     PHYSICAL EXAMINATION: ECOG PERFORMANCE STATUS: 0 - Asymptomatic  Vitals:   07/29/19 0928  BP: (!) 152/87  Pulse: 70  Resp: 18  Temp: 97.8 F (36.6 C)  SpO2: 98%   Filed Weights   07/29/19 0928  Weight: 210 lb 3.2 oz (95.3 kg)    GENERAL:alert, no distress and comfortable SKIN: skin color, texture, turgor are normal, no rashes or significant lesions EYES: normal, Conjunctiva are pink and non-injected, sclera clear OROPHARYNX:no exudate, no erythema and lips, buccal mucosa, and tongue normal  NECK: supple, thyroid normal size, non-tender, without nodularity LYMPH:  no palpable lymphadenopathy in the cervical, axillary or inguinal LUNGS: clear to auscultation and percussion with normal breathing effort HEART: regular rate & rhythm and no murmurs and no lower extremity edema ABDOMEN:abdomen soft, non-tender and normal bowel sounds Musculoskeletal:no cyanosis of digits and no clubbing  NEURO: alert & oriented x 3 with fluent speech, no focal motor/sensory deficits  LABORATORY DATA:  I have reviewed the data as listed    Component Value Date/Time   NA 138 05/26/2019 1043   NA 138 12/08/2018 0931   NA 139 01/30/2015 0924   K 4.4 05/26/2019 1043   K 4.8 01/30/2015 0924   CL 103 05/26/2019 1043   CO2 26 05/26/2019 1043   CO2 25 01/30/2015 0924   GLUCOSE 98 05/26/2019 1043    GLUCOSE 101 01/30/2015 0924   BUN 14 05/26/2019 1043   BUN 12 12/08/2018 0931   BUN 11.5 01/30/2015 0924   CREATININE 0.97 05/26/2019 1043   CREATININE 0.8 01/30/2015 0924   CALCIUM 9.0 05/26/2019 1043   CALCIUM 9.1 01/30/2015 0924   PROT 6.4 (L) 05/26/2019 1043   PROT 5.8 (L) 12/08/2018 0931   PROT 7.0 01/30/2015 0924   ALBUMIN 4.0 05/26/2019 1043   ALBUMIN 4.1 12/08/2018 0931   ALBUMIN 4.2 01/30/2015 0924   AST 43 (H) 05/26/2019 1043   AST 21 01/30/2015 0924   ALT 93 (H) 05/26/2019 1043   ALT 27 01/30/2015 0924   ALKPHOS 75 05/26/2019 1043   ALKPHOS 77 01/30/2015 0924   BILITOT 1.0 05/26/2019 1043   BILITOT 0.6 12/08/2018 0931   BILITOT 0.48 01/30/2015 0924   GFRNONAA >60 05/26/2019 1043   GFRNONAA 76 02/14/2014 1105   GFRAA >60 05/26/2019 1043   GFRAA 88 02/14/2014 1105    No results found for: SPEP, UPEP  Lab Results  Component Value Date   WBC 4.5 07/29/2019   NEUTROABS 3.4 07/29/2019   HGB 13.3 07/29/2019   HCT 37.2 07/29/2019   MCV 86.5 07/29/2019   PLT 181 07/29/2019      Chemistry      Component Value Date/Time   NA 138 05/26/2019 1043  NA 138 12/08/2018 0931   NA 139 01/30/2015 0924   K 4.4 05/26/2019 1043   K 4.8 01/30/2015 0924   CL 103 05/26/2019 1043   CO2 26 05/26/2019 1043   CO2 25 01/30/2015 0924   BUN 14 05/26/2019 1043   BUN 12 12/08/2018 0931   BUN 11.5 01/30/2015 0924   CREATININE 0.97 05/26/2019 1043   CREATININE 0.8 01/30/2015 0924      Component Value Date/Time   CALCIUM 9.0 05/26/2019 1043   CALCIUM 9.1 01/30/2015 0924   ALKPHOS 75 05/26/2019 1043   ALKPHOS 77 01/30/2015 0924   AST 43 (H) 05/26/2019 1043   AST 21 01/30/2015 0924   ALT 93 (H) 05/26/2019 1043   ALT 27 01/30/2015 0924   BILITOT 1.0 05/26/2019 1043   BILITOT 0.6 12/08/2018 0931   BILITOT 0.48 01/30/2015 0924       All questions were answered. The patient knows to call the clinic with any problems, questions or concerns. No barriers to learning was  detected.  I spent 15 minutes counseling the patient face to face. The total time spent in the appointment was 20 minutes and more than 50% was on counseling and review of test results  Heath Lark, MD 07/29/2019 9:41 AM

## 2019-07-29 NOTE — Assessment & Plan Note (Signed)
She has persistent elevated liver enzymes likely due to fatty liver disease She has lost some weight and her liver enzymes has improved We will observe only for now and continue lifestyle modification

## 2019-07-29 NOTE — Patient Instructions (Signed)
[facility] Discharge Instructions for Patients Receiving Chemotherapy  Today you received the following agents Rituxan     If you develop nausea and vomiting that is not controlled by your nausea medication, call the clinic.   BELOW ARE SYMPTOMS THAT SHOULD BE REPORTED IMMEDIATELY:  *FEVER GREATER THAN 100.5 F  *CHILLS WITH OR WITHOUT FEVER  NAUSEA AND VOMITING THAT IS NOT CONTROLLED WITH YOUR NAUSEA MEDICATION  *UNUSUAL SHORTNESS OF BREATH  *UNUSUAL BRUISING OR BLEEDING  TENDERNESS IN MOUTH AND THROAT WITH OR WITHOUT PRESENCE OF ULCERS  *URINARY PROBLEMS  *BOWEL PROBLEMS  UNUSUAL RASH Items with * indicate a potential emergency and should be followed up as soon as possible.  Feel free to call the clinic should you have any questions or concerns. The clinic phone number is [phone].  Please show the CHEMO ALERT CARD at check-in to the Emergency Department and triage nurse.   Rituximab injection What is this medicine? RITUXIMAB (ri TUX i mab) is a monoclonal antibody. It is used to treat certain types of cancer like non-Hodgkin lymphoma and chronic lymphocytic leukemia. It is also used to treat rheumatoid arthritis, granulomatosis with polyangiitis (or Wegener's granulomatosis), microscopic polyangiitis, and pemphigus vulgaris. This medicine may be used for other purposes; ask your health care provider or pharmacist if you have questions. COMMON BRAND NAME(S): Rituxan What should I tell my health care provider before I take this medicine? They need to know if you have any of these conditions: -heart disease -infection (especially a virus infection such as hepatitis B, chickenpox, cold sores, or herpes) -immune system problems -irregular heartbeat -kidney disease -low blood counts, like low white cell, platelet, or red cell counts -lung or breathing disease, like asthma -recently received or scheduled to receive a vaccine -an unusual or allergic  reaction to rituximab, other medicines, foods, dyes, or preservatives -pregnant or trying to get pregnant -breast-feeding How should I use this medicine? This medicine is for infusion into a vein. It is administered in a hospital or clinic by a specially trained health care professional. A special MedGuide will be given to you by the pharmacist with each prescription and refill. Be sure to read this information carefully each time. Talk to your pediatrician regarding the use of this medicine in children. This medicine is not approved for use in children. Overdosage: If you think you have taken too much of this medicine contact a poison control center or emergency room at once. NOTE: This medicine is only for you. Do not share this medicine with others. What if I miss a dose? It is important not to miss a dose. Call your doctor or health care professional if you are unable to keep an appointment. What may interact with this medicine? -cisplatin -live virus vaccines This list may not describe all possible interactions. Give your health care provider a list of all the medicines, herbs, non-prescription drugs, or dietary supplements you use. Also tell them if you smoke, drink alcohol, or use illegal drugs. Some items may interact with your medicine. What should I watch for while using this medicine? Your condition will be monitored carefully while you are receiving this medicine. You may need blood work done while you are taking this medicine. This medicine can cause serious allergic reactions. To reduce your risk you may need to take medicine before treatment with this medicine. Take your medicine as directed. In some patients, this medicine may cause a serious brain infection that may cause death. If you have   any problems seeing, thinking, speaking, walking, or standing, tell your healthcare professional right away. If you cannot reach your healthcare professional, urgently seek other source of  medical care. Call your doctor or health care professional for advice if you get a fever, chills or sore throat, or other symptoms of a cold or flu. Do not treat yourself. This drug decreases your body's ability to fight infections. Try to avoid being around people who are sick. Do not become pregnant while taking this medicine or for 12 months after stopping it. Women should inform their doctor if they wish to become pregnant or think they might be pregnant. There is a potential for serious side effects to an unborn child. Talk to your health care professional or pharmacist for more information. Do not breast-feed an infant while taking this medicine or for 6 months after stopping it. What side effects may I notice from receiving this medicine? Side effects that you should report to your doctor or health care professional as soon as possible: -allergic reactions like skin rash, itching or hives; swelling of the face, lips, or tongue -breathing problems -chest pain -changes in vision -diarrhea -headache with fever, neck stiffness, sensitivity to light, nausea, or confusion -fast, irregular heartbeat -loss of memory -low blood counts - this medicine may decrease the number of white blood cells, red blood cells and platelets. You may be at increased risk for infections and bleeding. -mouth sores -problems with balance, talking, or walking -redness, blistering, peeling or loosening of the skin, including inside the mouth -signs of infection - fever or chills, cough, sore throat, pain or difficulty passing urine -signs and symptoms of kidney injury like trouble passing urine or change in the amount of urine -signs and symptoms of liver injury like dark yellow or brown urine; general ill feeling or flu-like symptoms; light-colored stools; loss of appetite; nausea; right upper belly pain; unusually weak or tired; yellowing of the eyes or skin -signs and symptoms of low blood pressure like dizziness;  feeling faint or lightheaded, falls; unusually weak or tired -stomach pain -swelling of the ankles, feet, hands -unusual bleeding or bruising -vomiting Side effects that usually do not require medical attention (report to your doctor or health care professional if they continue or are bothersome): -headache -joint pain -muscle cramps or muscle pain -nausea -tiredness This list may not describe all possible side effects. Call your doctor for medical advice about side effects. You may report side effects to FDA at 1-800-FDA-1088. Where should I keep my medicine? This drug is given in a hospital or clinic and will not be stored at home. NOTE: This sheet is a summary. It may not cover all possible information. If you have questions about this medicine, talk to your doctor, pharmacist, or health care provider.  2019 Elsevier/Gold Standard (2017-09-11 13:04:32)  

## 2019-07-30 LAB — IGM: IgM (Immunoglobulin M), Srm: 19 mg/dL — ABNORMAL LOW (ref 26–217)

## 2019-08-03 ENCOUNTER — Telehealth: Payer: Self-pay | Admitting: Hematology and Oncology

## 2019-08-03 NOTE — Telephone Encounter (Signed)
I could not reach patient regarding schedule  °

## 2019-09-30 ENCOUNTER — Other Ambulatory Visit: Payer: Self-pay

## 2019-09-30 ENCOUNTER — Inpatient Hospital Stay (HOSPITAL_BASED_OUTPATIENT_CLINIC_OR_DEPARTMENT_OTHER): Payer: Commercial Managed Care - PPO | Admitting: Hematology and Oncology

## 2019-09-30 ENCOUNTER — Other Ambulatory Visit: Payer: Self-pay | Admitting: Hematology and Oncology

## 2019-09-30 ENCOUNTER — Inpatient Hospital Stay: Payer: Commercial Managed Care - PPO

## 2019-09-30 ENCOUNTER — Inpatient Hospital Stay: Payer: Commercial Managed Care - PPO | Attending: Hematology and Oncology

## 2019-09-30 ENCOUNTER — Encounter: Payer: Self-pay | Admitting: Hematology and Oncology

## 2019-09-30 VITALS — BP 130/76 | HR 73 | Temp 98.6°F | Resp 16

## 2019-09-30 DIAGNOSIS — Z791 Long term (current) use of non-steroidal anti-inflammatories (NSAID): Secondary | ICD-10-CM | POA: Insufficient documentation

## 2019-09-30 DIAGNOSIS — Z5112 Encounter for antineoplastic immunotherapy: Secondary | ICD-10-CM | POA: Diagnosis not present

## 2019-09-30 DIAGNOSIS — T451X5A Adverse effect of antineoplastic and immunosuppressive drugs, initial encounter: Secondary | ICD-10-CM | POA: Diagnosis not present

## 2019-09-30 DIAGNOSIS — D701 Agranulocytosis secondary to cancer chemotherapy: Secondary | ICD-10-CM | POA: Diagnosis not present

## 2019-09-30 DIAGNOSIS — C8307 Small cell B-cell lymphoma, spleen: Secondary | ICD-10-CM

## 2019-09-30 DIAGNOSIS — I2510 Atherosclerotic heart disease of native coronary artery without angina pectoris: Secondary | ICD-10-CM | POA: Insufficient documentation

## 2019-09-30 DIAGNOSIS — R748 Abnormal levels of other serum enzymes: Secondary | ICD-10-CM

## 2019-09-30 DIAGNOSIS — Z79899 Other long term (current) drug therapy: Secondary | ICD-10-CM | POA: Insufficient documentation

## 2019-09-30 DIAGNOSIS — K760 Fatty (change of) liver, not elsewhere classified: Secondary | ICD-10-CM | POA: Insufficient documentation

## 2019-09-30 DIAGNOSIS — D591 Autoimmune hemolytic anemia, unspecified: Secondary | ICD-10-CM

## 2019-09-30 DIAGNOSIS — D472 Monoclonal gammopathy: Secondary | ICD-10-CM

## 2019-09-30 LAB — CBC WITH DIFFERENTIAL/PLATELET
Abs Immature Granulocytes: 0.03 10*3/uL (ref 0.00–0.07)
Basophils Absolute: 0 10*3/uL (ref 0.0–0.1)
Basophils Relative: 0 %
Eosinophils Absolute: 0.1 10*3/uL (ref 0.0–0.5)
Eosinophils Relative: 3 %
HCT: 37.8 % (ref 36.0–46.0)
Hemoglobin: 13.3 g/dL (ref 12.0–15.0)
Immature Granulocytes: 1 %
Lymphocytes Relative: 15 %
Lymphs Abs: 0.6 10*3/uL — ABNORMAL LOW (ref 0.7–4.0)
MCH: 31.3 pg (ref 26.0–34.0)
MCHC: 35.2 g/dL (ref 30.0–36.0)
MCV: 88.9 fL (ref 80.0–100.0)
Monocytes Absolute: 0.3 10*3/uL (ref 0.1–1.0)
Monocytes Relative: 8 %
Neutro Abs: 2.8 10*3/uL (ref 1.7–7.7)
Neutrophils Relative %: 73 %
Platelets: 157 10*3/uL (ref 150–400)
RBC: 4.25 MIL/uL (ref 3.87–5.11)
RDW: 12.7 % (ref 11.5–15.5)
WBC: 3.9 10*3/uL — ABNORMAL LOW (ref 4.0–10.5)
nRBC: 0 % (ref 0.0–0.2)

## 2019-09-30 LAB — COMPREHENSIVE METABOLIC PANEL
ALT: 89 U/L — ABNORMAL HIGH (ref 0–44)
AST: 32 U/L (ref 15–41)
Albumin: 4.1 g/dL (ref 3.5–5.0)
Alkaline Phosphatase: 66 U/L (ref 38–126)
Anion gap: 10 (ref 5–15)
BUN: 18 mg/dL (ref 6–20)
CO2: 23 mmol/L (ref 22–32)
Calcium: 8.7 mg/dL — ABNORMAL LOW (ref 8.9–10.3)
Chloride: 106 mmol/L (ref 98–111)
Creatinine, Ser: 0.8 mg/dL (ref 0.44–1.00)
GFR calc Af Amer: >60 mL/min (ref >60)
GFR calc non Af Amer: >60 mL/min (ref >60)
Glucose, Bld: 110 mg/dL — ABNORMAL HIGH (ref 70–99)
Potassium: 4.1 mmol/L (ref 3.5–5.1)
Sodium: 139 mmol/L (ref 135–145)
Total Bilirubin: 0.8 mg/dL (ref 0.3–1.2)
Total Protein: 6.5 g/dL (ref 6.5–8.1)

## 2019-09-30 LAB — LACTATE DEHYDROGENASE: LDH: 160 U/L (ref 98–192)

## 2019-09-30 MED ORDER — SODIUM CHLORIDE 0.9 % IV SOLN
Freq: Once | INTRAVENOUS | Status: AC
  Filled 2019-09-30: qty 250

## 2019-09-30 MED ORDER — ACETAMINOPHEN 325 MG PO TABS
ORAL_TABLET | ORAL | Status: AC
  Filled 2019-09-30: qty 2

## 2019-09-30 MED ORDER — ACETAMINOPHEN 325 MG PO TABS
650.0000 mg | ORAL_TABLET | Freq: Once | ORAL | Status: AC
  Administered 2019-09-30: 10:00:00 650 mg via ORAL

## 2019-09-30 MED ORDER — METHYLPREDNISOLONE SODIUM SUCC 40 MG IJ SOLR
INTRAMUSCULAR | Status: AC
  Filled 2019-09-30: qty 1

## 2019-09-30 MED ORDER — DIPHENHYDRAMINE HCL 25 MG PO CAPS
ORAL_CAPSULE | ORAL | Status: AC
  Filled 2019-09-30: qty 1

## 2019-09-30 MED ORDER — SODIUM CHLORIDE 0.9 % IV SOLN
375.0000 mg/m2 | Freq: Once | INTRAVENOUS | Status: AC
  Administered 2019-09-30: 11:00:00 800 mg via INTRAVENOUS
  Filled 2019-09-30: qty 30

## 2019-09-30 MED ORDER — METHYLPREDNISOLONE SODIUM SUCC 40 MG IJ SOLR
40.0000 mg | Freq: Once | INTRAMUSCULAR | Status: AC
  Administered 2019-09-30: 10:00:00 40 mg via INTRAVENOUS

## 2019-09-30 MED ORDER — DIPHENHYDRAMINE HCL 25 MG PO CAPS
25.0000 mg | ORAL_CAPSULE | Freq: Once | ORAL | Status: AC
  Administered 2019-09-30: 10:00:00 25 mg via ORAL

## 2019-09-30 NOTE — Progress Notes (Signed)
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 We will continue treatment every 60 days with premedication CT imaging showed no evidence of splenomegaly or recurrence of lymphoma The plan is to continue maintenance rituximab for 2 years, until around mid 2021 I do not plan to repeat CT imaging until after she completes her treatment next year  Leukopenia due to antineoplastic chemotherapy [facility]) She has mild leukopenia but not symptomatic We will proceed with treatment without adjustment We discussed neutropenic precaution  Elevated liver enzymes She has persistent elevated liver enzymes likely due to fatty liver disease She has lost some weight and her liver enzymes has stabilized We will observe only for now and continue lifestyle modification   No orders of the defined types were placed in this encounter.   INTERVAL HISTORY: Please see below for problem oriented charting. She returns for further follow-up She is doing well No infusion reaction No recent infection, fever or chills She is attempting to eat healthy and has lost some weight  SUMMARY OF ONCOLOGIC HISTORY: Oncology History  Lymphoma, marginal zone, spleen ([facility])  03/23/2014 Bone Marrow Biopsy   Bone marrow is involved   04/19/2014 Imaging   PET CT scan showed lymphadenopathy and splenomegaly   04/27/2014 - 06/01/2014 Chemotherapy   She received weekly rituximab and cladribine X 6 cycles   05/21/2018 PET scan   1. 8 mm in short axis right level I a lymph node with Deauville 4 activity, increased from prior. 2. Small but mildly hypermetabolic left axillary lymph node with Deauville 4 activity. 3. Splenic activity only minimally greater  than that of the liver, but with a moderate amount of splenomegaly (splenic volume 680 cubic cm). No focal splenic lesion observed. 4. Diffuse mildly increased activity in the skeleton without corresponding CT abnormality. Possibilities might include low-level marrow infiltration or granulocyte stimulation.   07/02/2018 - 11/26/2018 Chemotherapy   The patient had rituximab for chemotherapy treatment.     07/02/2018 - 11/26/2018 Chemotherapy   The patient had rituximab for chemotherapy treatment.     12/30/2018 Cancer Staging   Staging form: Hodgkin and Non-Hodgkin Lymphoma, AJCC 7th Edition - Clinical: S - Spleen, B - Symptoms - Signed by Heath Lark, MD on 12/30/2018   05/26/2019 Imaging   1. No findings in the chest, abdomen or pelvis to suggest recurrent disease. 2. No acute findings in the chest, abdomen or pelvis. 3. Mild atherosclerosis in the pelvic vasculature. 4. Additional incidental findings, as above.     REVIEW OF SYSTEMS:   Constitutional: Denies fevers, chills or abnormal weight loss Eyes: Denies blurriness of vision Ears, nose, mouth, throat, and face: Denies mucositis or sore throat Respiratory: Denies cough, dyspnea or wheezes Cardiovascular: Denies palpitation, chest discomfort or lower extremity swelling Gastrointestinal:  Denies nausea, heartburn or change in bowel habits Skin: Denies abnormal skin rashes Lymphatics: Denies new lymphadenopathy or easy bruising Neurological:Denies numbness, tingling or new weaknesses Behavioral/Psych: Mood is stable, no new changes  All other systems were reviewed with the patient and are negative.  I have reviewed the past medical history, past surgical history, social history and family history with the patient and they are unchanged from previous note.  ALLERGIES:  has No Known Allergies.  MEDICATIONS:  Current Outpatient Medications  Medication Sig Dispense Refill  . fluticasone (FLONASE) 50 MCG/ACT nasal spray PLACE 1-2  SPRAYS INTO BOTH NOSTRILS DAILY AS NEEDED FOR RHINITIS. 16 mL 1  . furosemide (LASIX) 20 MG tablet Take 1 tablet (20 mg total) by mouth daily. 30 tablet 9  . ibuprofen (ADVIL,MOTRIN) 200 MG tablet Take 400-800 mg by mouth every 6 (six) hours as needed for headache, mild pain or moderate pain.     . pantoprazole (PROTONIX) 20 MG tablet TAKE 1 TABLET BY MOUTH EVERY DAY 90 tablet 2  . Potassium Chloride ER 20 MEQ TBCR TAKE 1 TABLET BY MOUTH EVERY DAY 90 tablet 1   No current facility-administered medications for this visit.    PHYSICAL EXAMINATION: ECOG PERFORMANCE STATUS: 0 - Asymptomatic  Vitals:   09/30/19 0928  BP: (!) 141/73  Pulse: 70  Resp: 18  Temp: 97.8 F (36.6 C)  SpO2: 100%   Filed Weights   09/30/19 0928  Weight: 207 lb 8 oz (94.1 kg)    GENERAL:alert, no distress and comfortable SKIN: skin color, texture, turgor are normal, no rashes or significant lesions EYES: normal, Conjunctiva are pink and non-injected, sclera clear OROPHARYNX:no exudate, no erythema and lips, buccal mucosa, and tongue normal  NECK: supple, thyroid normal size, non-tender, without nodularity LYMPH:  no palpable lymphadenopathy in the cervical, axillary or inguinal LUNGS: clear to auscultation and percussion with normal breathing effort HEART: regular rate & rhythm and no murmurs and no lower extremity edema ABDOMEN:abdomen soft, non-tender and normal bowel sounds Musculoskeletal:no cyanosis of digits and no clubbing  NEURO: alert & oriented x 3 with fluent speech, no focal motor/sensory deficits  LABORATORY DATA:  I have reviewed the data as listed    Component Value Date/Time   NA 139 09/30/2019 0909   NA 138 12/08/2018 0931   NA 139 01/30/2015 0924   K 4.1 09/30/2019 0909   K 4.8 01/30/2015 0924   CL 106 09/30/2019 0909   CO2 23 09/30/2019 0909   CO2 25 01/30/2015 0924   GLUCOSE 110 (H) 09/30/2019 0909   GLUCOSE 101 01/30/2015 0924   BUN 18 09/30/2019 0909   BUN 12 12/08/2018  0931   BUN 11.5 01/30/2015 0924   CREATININE 0.80 09/30/2019 0909   CREATININE 0.8 01/30/2015 0924   CALCIUM 8.7 (L) 09/30/2019 0909   CALCIUM 9.1 01/30/2015 0924   PROT 6.5 09/30/2019 0909   PROT 5.8 (L) 12/08/2018 0931   PROT 7.0 01/30/2015 0924   ALBUMIN 4.1 09/30/2019 0909   ALBUMIN 4.1 12/08/2018 0931   ALBUMIN 4.2 01/30/2015 0924   AST 32 09/30/2019 0909   AST 21 01/30/2015 0924   ALT 89 (H) 09/30/2019 0909   ALT 27 01/30/2015 0924   ALKPHOS 66 09/30/2019 0909   ALKPHOS 77 01/30/2015 0924   BILITOT 0.8 09/30/2019 0909   BILITOT 0.6 12/08/2018 0931   BILITOT 0.48 01/30/2015 0924   GFRNONAA >60 09/30/2019 0909   GFRNONAA 76 02/14/2014 1105   GFRAA >60 09/30/2019 0909   GFRAA 88 02/14/2014 1105    No results found for: SPEP, UPEP  Lab Results  Component Value Date   WBC 3.9 (L) 09/30/2019   NEUTROABS 2.8 09/30/2019   HGB 13.3 09/30/2019   HCT 37.8 09/30/2019   MCV 88.9 09/30/2019   PLT 157 09/30/2019      Chemistry      Component Value Date/Time   NA 139 09/30/2019 0909   NA 138 12/08/2018 0931   NA 139 01/30/2015 0924  K 4.1 09/30/2019 0909   K 4.8 01/30/2015 0924   CL 106 09/30/2019 0909   CO2 23 09/30/2019 0909   CO2 25 01/30/2015 0924   BUN 18 09/30/2019 0909   BUN 12 12/08/2018 0931   BUN 11.5 01/30/2015 0924   CREATININE 0.80 09/30/2019 0909   CREATININE 0.8 01/30/2015 0924      Component Value Date/Time   CALCIUM 8.7 (L) 09/30/2019 0909   CALCIUM 9.1 01/30/2015 0924   ALKPHOS 66 09/30/2019 0909   ALKPHOS 77 01/30/2015 0924   AST 32 09/30/2019 0909   AST 21 01/30/2015 0924   ALT 89 (H) 09/30/2019 0909   ALT 27 01/30/2015 0924   BILITOT 0.8 09/30/2019 0909   BILITOT 0.6 12/08/2018 0931   BILITOT 0.48 01/30/2015 0924      All questions were answered. The patient knows to call the clinic with any problems, questions or concerns. No barriers to learning was detected.  I spent 15 minutes counseling the patient face to face. The total time  spent in the appointment was 20 minutes and more than 50% was on counseling and review of test results  Heath Lark, MD 09/30/2019 11:28 AM

## 2019-09-30 NOTE — Assessment & Plan Note (Signed)
She has mild leukopenia but not symptomatic We will proceed with treatment without adjustment We discussed neutropenic precaution

## 2019-09-30 NOTE — Assessment & Plan Note (Signed)
She tolerated rituximab well with resolution of autoimmune hemolytic anemia We will continue treatment every 60 days with premedication CT imaging showed no evidence of splenomegaly or recurrence of lymphoma The plan is to continue maintenance rituximab for 2 years, until around mid 2021 I do not plan to repeat CT imaging until after she completes her treatment next year

## 2019-09-30 NOTE — Patient Instructions (Addendum)
  [facility] Discharge Instructions for Patients Receiving Chemotherapy  Today you received the following agents Rituxan     If you develop nausea and vomiting that is not controlled by your nausea medication, call the clinic.   BELOW ARE SYMPTOMS THAT SHOULD BE REPORTED IMMEDIATELY:  *FEVER GREATER THAN 100.5 F  *CHILLS WITH OR WITHOUT FEVER  NAUSEA AND VOMITING THAT IS NOT CONTROLLED WITH YOUR NAUSEA MEDICATION  *UNUSUAL SHORTNESS OF BREATH  *UNUSUAL BRUISING OR BLEEDING  TENDERNESS IN MOUTH AND THROAT WITH OR WITHOUT PRESENCE OF ULCERS  *URINARY PROBLEMS  *BOWEL PROBLEMS  UNUSUAL RASH Items with * indicate a potential emergency and should be followed up as soon as possible.  Feel free to call the clinic should you have any questions or concerns. The clinic phone number is [phone].  Please show the [place] at check-in to the Emergency Department and triage nurse.

## 2019-09-30 NOTE — Assessment & Plan Note (Signed)
She has persistent elevated liver enzymes likely due to fatty liver disease She has lost some weight and her liver enzymes has stabilized We will observe only for now and continue lifestyle modification

## 2019-10-01 LAB — IGM: IgM (Immunoglobulin M), Srm: 19 mg/dL — ABNORMAL LOW (ref 26–217)

## 2019-10-05 ENCOUNTER — Telehealth: Payer: Self-pay | Admitting: Hematology and Oncology

## 2019-10-05 NOTE — Telephone Encounter (Signed)
Scheduled appt per 12/18 sch message - pt aware of appt date and time

## 2019-10-31 ENCOUNTER — Encounter: Payer: Self-pay | Admitting: Hematology and Oncology

## 2019-12-01 ENCOUNTER — Telehealth: Payer: Self-pay | Admitting: Hematology and Oncology

## 2019-12-01 NOTE — Telephone Encounter (Signed)
Rescheduled per 2/16 sch msg, pt req. Called pt no answer unable to leave a msg. Mailing printout

## 2019-12-02 ENCOUNTER — Inpatient Hospital Stay: Payer: Commercial Managed Care - PPO

## 2019-12-02 ENCOUNTER — Inpatient Hospital Stay: Payer: Commercial Managed Care - PPO | Admitting: Hematology and Oncology

## 2019-12-16 ENCOUNTER — Encounter: Payer: Self-pay | Admitting: Hematology and Oncology

## 2019-12-16 ENCOUNTER — Telehealth: Payer: Self-pay | Admitting: Hematology and Oncology

## 2019-12-16 ENCOUNTER — Inpatient Hospital Stay: Payer: Commercial Managed Care - PPO | Attending: Hematology and Oncology

## 2019-12-16 ENCOUNTER — Other Ambulatory Visit: Payer: Self-pay

## 2019-12-16 ENCOUNTER — Inpatient Hospital Stay (HOSPITAL_BASED_OUTPATIENT_CLINIC_OR_DEPARTMENT_OTHER): Payer: Commercial Managed Care - PPO | Admitting: Hematology and Oncology

## 2019-12-16 ENCOUNTER — Inpatient Hospital Stay: Payer: Commercial Managed Care - PPO

## 2019-12-16 VITALS — BP 138/72 | HR 93 | Temp 98.6°F | Resp 18

## 2019-12-16 DIAGNOSIS — C8307 Small cell B-cell lymphoma, spleen: Secondary | ICD-10-CM

## 2019-12-16 DIAGNOSIS — R748 Abnormal levels of other serum enzymes: Secondary | ICD-10-CM | POA: Diagnosis not present

## 2019-12-16 DIAGNOSIS — Z5112 Encounter for antineoplastic immunotherapy: Secondary | ICD-10-CM | POA: Insufficient documentation

## 2019-12-16 DIAGNOSIS — Z79899 Other long term (current) drug therapy: Secondary | ICD-10-CM | POA: Diagnosis not present

## 2019-12-16 DIAGNOSIS — R635 Abnormal weight gain: Secondary | ICD-10-CM

## 2019-12-16 DIAGNOSIS — Z791 Long term (current) use of non-steroidal anti-inflammatories (NSAID): Secondary | ICD-10-CM | POA: Diagnosis not present

## 2019-12-16 DIAGNOSIS — D591 Autoimmune hemolytic anemia, unspecified: Secondary | ICD-10-CM

## 2019-12-16 DIAGNOSIS — D472 Monoclonal gammopathy: Secondary | ICD-10-CM

## 2019-12-16 LAB — CBC WITH DIFFERENTIAL/PLATELET
Abs Immature Granulocytes: 0.03 10*3/uL (ref 0.00–0.07)
Basophils Absolute: 0 10*3/uL (ref 0.0–0.1)
Basophils Relative: 0 %
Eosinophils Absolute: 0.1 10*3/uL (ref 0.0–0.5)
Eosinophils Relative: 3 %
HCT: 38.5 % (ref 36.0–46.0)
Hemoglobin: 13.4 g/dL (ref 12.0–15.0)
Immature Granulocytes: 1 %
Lymphocytes Relative: 13 %
Lymphs Abs: 0.6 10*3/uL — ABNORMAL LOW (ref 0.7–4.0)
MCH: 30.5 pg (ref 26.0–34.0)
MCHC: 34.8 g/dL (ref 30.0–36.0)
MCV: 87.7 fL (ref 80.0–100.0)
Monocytes Absolute: 0.4 10*3/uL (ref 0.1–1.0)
Monocytes Relative: 8 %
Neutro Abs: 3.7 10*3/uL (ref 1.7–7.7)
Neutrophils Relative %: 75 %
Platelets: 150 10*3/uL (ref 150–400)
RBC: 4.39 MIL/uL (ref 3.87–5.11)
RDW: 12.6 % (ref 11.5–15.5)
WBC: 4.9 10*3/uL (ref 4.0–10.5)
nRBC: 0 % (ref 0.0–0.2)

## 2019-12-16 LAB — COMPREHENSIVE METABOLIC PANEL
ALT: 91 U/L — ABNORMAL HIGH (ref 0–44)
AST: 31 U/L (ref 15–41)
Albumin: 3.9 g/dL (ref 3.5–5.0)
Alkaline Phosphatase: 86 U/L (ref 38–126)
Anion gap: 10 (ref 5–15)
BUN: 15 mg/dL (ref 6–20)
CO2: 24 mmol/L (ref 22–32)
Calcium: 9 mg/dL (ref 8.9–10.3)
Chloride: 103 mmol/L (ref 98–111)
Creatinine, Ser: 0.87 mg/dL (ref 0.44–1.00)
GFR calc Af Amer: >60 mL/min (ref >60)
GFR calc non Af Amer: >60 mL/min (ref >60)
Glucose, Bld: 109 mg/dL — ABNORMAL HIGH (ref 70–99)
Potassium: 3.9 mmol/L (ref 3.5–5.1)
Sodium: 137 mmol/L (ref 135–145)
Total Bilirubin: 1.3 mg/dL — ABNORMAL HIGH (ref 0.3–1.2)
Total Protein: 6.3 g/dL — ABNORMAL LOW (ref 6.5–8.1)

## 2019-12-16 LAB — LACTATE DEHYDROGENASE: LDH: 197 U/L — ABNORMAL HIGH (ref 98–192)

## 2019-12-16 MED ORDER — METHYLPREDNISOLONE SODIUM SUCC 40 MG IJ SOLR
INTRAMUSCULAR | Status: AC
  Filled 2019-12-16: qty 1

## 2019-12-16 MED ORDER — DIPHENHYDRAMINE HCL 25 MG PO CAPS
ORAL_CAPSULE | ORAL | Status: AC
  Filled 2019-12-16: qty 1

## 2019-12-16 MED ORDER — SODIUM CHLORIDE 0.9 % IV SOLN
Freq: Once | INTRAVENOUS | Status: AC
  Filled 2019-12-16: qty 250

## 2019-12-16 MED ORDER — ACETAMINOPHEN 325 MG PO TABS
650.0000 mg | ORAL_TABLET | Freq: Once | ORAL | Status: AC
  Administered 2019-12-16: 13:00:00 650 mg via ORAL

## 2019-12-16 MED ORDER — SODIUM CHLORIDE 0.9 % IV SOLN
375.0000 mg/m2 | Freq: Once | INTRAVENOUS | Status: AC
  Administered 2019-12-16: 800 mg via INTRAVENOUS
  Filled 2019-12-16: qty 50

## 2019-12-16 MED ORDER — METHYLPREDNISOLONE SODIUM SUCC 40 MG IJ SOLR
40.0000 mg | Freq: Once | INTRAMUSCULAR | Status: AC
  Administered 2019-12-16: 40 mg via INTRAVENOUS

## 2019-12-16 MED ORDER — DIPHENHYDRAMINE HCL 25 MG PO CAPS
25.0000 mg | ORAL_CAPSULE | Freq: Once | ORAL | Status: AC
  Administered 2019-12-16: 13:00:00 25 mg via ORAL

## 2019-12-16 MED ORDER — ACETAMINOPHEN 325 MG PO TABS
ORAL_TABLET | ORAL | Status: AC
  Filled 2019-12-16: qty 2

## 2019-12-16 NOTE — Assessment & Plan Note (Signed)
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

## 2019-12-16 NOTE — Telephone Encounter (Signed)
Scheduled appt per 3/5 sch message - pt is aware of appt added

## 2019-12-16 NOTE — Progress Notes (Signed)
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 We will continue treatment every 60 days with premedication Her last CT imaging showed no evidence of splenomegaly or recurrence of lymphoma The plan is to continue maintenance rituximab for 2 years, until around mid 2021 I do not plan to repeat CT imaging until after she completes her treatment   Excessive weight gain She had history of excessive weight gain while on treatment She is interested to lose some weight and medical management for weight loss I spent some time discussing some strategies to help her lose some weight including monitoring of oral intake, order of food, weighing herself daily, getting adequate sleep and oral hydration and also we discussed briefly the role of Metformin to help with obesity, weight loss and prevention of diabetes Ultimately, she would like to try nonmedication lifestyle changes before embarking on taking medications I will reassess her progress in her next visit  Elevated liver enzymes She has persistent elevated liver enzymes likely due to fatty liver disease We will observe only for now and continue lifestyle modification   No orders of the defined types were placed in this encounter.   All questions were answered. The patient knows to call the clinic with any problems, questions or concerns. The total time spent in the appointment was 20 minutes encounter with patients including review of chart and various tests results, discussions about plan of care and coordination of care plan   Heath Lark, MD 12/16/2019 1:31 PM  INTERVAL HISTORY: Please see below for problem oriented charting. 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 8 mm in short axis right level I a lymph node with Deauville 4 activity, increased from prior. 2. Small but mildly hypermetabolic left axillary lymph node with Deauville 4 activity. 3. Splenic activity only minimally greater than that of the liver, but with a moderate amount of splenomegaly (splenic volume 680 cubic cm). No focal splenic lesion observed. 4. Diffuse mildly increased activity in the skeleton without corresponding CT abnormality. Possibilities might include low-level marrow infiltration or granulocyte stimulation.   07/02/2018 - 11/26/2018 Chemotherapy   The patient had rituximab for chemotherapy treatment.     07/02/2018 - 11/26/2018 Chemotherapy   The patient had rituximab for chemotherapy treatment.     12/30/2018 Cancer Staging   Staging form: Hodgkin and Non-Hodgkin Lymphoma, AJCC 7th Edition - Clinical: S - Spleen, B - Symptoms - Signed by Heath Lark, MD on 12/30/2018   05/26/2019 Imaging   1. No findings in the chest, abdomen or pelvis to suggest recurrent disease. 2. No acute findings in the chest, abdomen or pelvis. 3. Mild atherosclerosis in the pelvic vasculature. 4. Additional incidental findings, as above.     REVIEW OF SYSTEMS:   Constitutional: Denies fevers, chills or abnormal weight loss  Eyes: Denies blurriness of vision Ears, nose, mouth, throat, and face: Denies mucositis or sore throat Respiratory: Denies cough, dyspnea or wheezes Cardiovascular: Denies palpitation, chest discomfort or  lower extremity swelling Gastrointestinal:  Denies nausea, heartburn or change in bowel habits Skin: Denies abnormal skin rashes Lymphatics: Denies new lymphadenopathy or easy bruising Neurological:Denies numbness, tingling or new weaknesses Behavioral/Psych: Mood is stable, no new changes  All other systems were reviewed with the patient and are negative.  I have reviewed the past medical history, past surgical history, social history and family history with the patient and they are unchanged from previous note.  ALLERGIES:  has No Known Allergies.  MEDICATIONS:  Current Outpatient Medications  Medication Sig Dispense Refill  . fluticasone (FLONASE) 50 MCG/ACT nasal spray PLACE 1-2 SPRAYS INTO BOTH NOSTRILS DAILY AS NEEDED FOR RHINITIS. 16 mL 1  . furosemide (LASIX) 20 MG tablet Take 1 tablet (20 mg total) by mouth daily. 30 tablet 9  . ibuprofen (ADVIL,MOTRIN) 200 MG tablet Take 400-800 mg by mouth every 6 (six) hours as needed for headache, mild pain or moderate pain.     . pantoprazole (PROTONIX) 20 MG tablet TAKE 1 TABLET BY MOUTH EVERY DAY 90 tablet 2  . Potassium Chloride ER 20 MEQ TBCR TAKE 1 TABLET BY MOUTH EVERY DAY 90 tablet 1   No current facility-administered medications for this visit.   Facility-Administered Medications Ordered in Other Visits  Medication Dose Route Frequency Provider Last Rate Last Admin  . riTUXimab (RITUXAN) 800 mg in sodium chloride 0.9 % 250 mL (2.4242 mg/mL) infusion  375 mg/m2 (Treatment Plan Recorded) Intravenous Once Heath Lark, MD        PHYSICAL EXAMINATION: ECOG PERFORMANCE STATUS: 0 - Asymptomatic  Vitals:   12/16/19 1236  BP: (!) 151/80  Pulse: 90  Resp: 18  Temp: 98 F (36.7 C)  SpO2: 98%   Filed Weights   12/16/19 1236  Weight: 206 lb (93.4 kg)    GENERAL:alert, no distress and comfortable NEURO: alert & oriented x 3 with fluent speech, no focal motor/sensory deficits  LABORATORY DATA:  I have reviewed the data as  listed    Component Value Date/Time   NA 137 12/16/2019 1222   NA 138 12/08/2018 0931   NA 139 01/30/2015 0924   K 3.9 12/16/2019 1222   K 4.8 01/30/2015 0924   CL 103 12/16/2019 1222   CO2 24 12/16/2019 1222   CO2 25 01/30/2015 0924   GLUCOSE 109 (H) 12/16/2019 1222   GLUCOSE 101 01/30/2015 0924   BUN 15 12/16/2019 1222   BUN 12 12/08/2018 0931   BUN 11.5 01/30/2015 0924   CREATININE 0.87 12/16/2019 1222   CREATININE 0.8 01/30/2015 0924   CALCIUM 9.0 12/16/2019 1222   CALCIUM 9.1 01/30/2015 0924   PROT 6.3 (L) 12/16/2019 1222   PROT 5.8 (L) 12/08/2018 0931   PROT 7.0 01/30/2015 0924   ALBUMIN 3.9 12/16/2019 1222   ALBUMIN 4.1 12/08/2018 0931   ALBUMIN 4.2 01/30/2015 0924   AST 31 12/16/2019 1222   AST 21 01/30/2015 0924   ALT 91 (H) 12/16/2019 1222   ALT 27 01/30/2015 0924   ALKPHOS 86 12/16/2019 1222   ALKPHOS 77 01/30/2015 0924   BILITOT 1.3 (H) 12/16/2019 1222   BILITOT 0.6 12/08/2018 0931   BILITOT 0.48 01/30/2015 0924   GFRNONAA >60 12/16/2019 1222   GFRNONAA 76 02/14/2014 1105   GFRAA >60 12/16/2019 1222   GFRAA 88 02/14/2014 1105    No results  found for: SPEP, UPEP  Lab Results  Component Value Date   WBC 4.9 12/16/2019   NEUTROABS 3.7 12/16/2019   HGB 13.4 12/16/2019   HCT 38.5 12/16/2019   MCV 87.7 12/16/2019   PLT 150 12/16/2019      Chemistry      Component Value Date/Time   NA 137 12/16/2019 1222   NA 138 12/08/2018 0931   NA 139 01/30/2015 0924   K 3.9 12/16/2019 1222   K 4.8 01/30/2015 0924   CL 103 12/16/2019 1222   CO2 24 12/16/2019 1222   CO2 25 01/30/2015 0924   BUN 15 12/16/2019 1222   BUN 12 12/08/2018 0931   BUN 11.5 01/30/2015 0924   CREATININE 0.87 12/16/2019 1222   CREATININE 0.8 01/30/2015 0924      Component Value Date/Time   CALCIUM 9.0 12/16/2019 1222   CALCIUM 9.1 01/30/2015 0924   ALKPHOS 86 12/16/2019 1222   ALKPHOS 77 01/30/2015 0924   AST 31 12/16/2019 1222   AST 21 01/30/2015 0924   ALT 91 (H) 12/16/2019  1222   ALT 27 01/30/2015 0924   BILITOT 1.3 (H) 12/16/2019 1222   BILITOT 0.6 12/08/2018 0931   BILITOT 0.48 01/30/2015 0164

## 2019-12-16 NOTE — Patient Instructions (Signed)
[facility] Discharge Instructions for Patients Receiving Chemotherapy  Today you received the following agents Rituxan     If you develop nausea and vomiting that is not controlled by your nausea medication, call the clinic.   BELOW ARE SYMPTOMS THAT SHOULD BE REPORTED IMMEDIATELY:  *FEVER GREATER THAN 100.5 F  *CHILLS WITH OR WITHOUT FEVER  NAUSEA AND VOMITING THAT IS NOT CONTROLLED WITH YOUR NAUSEA MEDICATION  *UNUSUAL SHORTNESS OF BREATH  *UNUSUAL BRUISING OR BLEEDING  TENDERNESS IN MOUTH AND THROAT WITH OR WITHOUT PRESENCE OF ULCERS  *URINARY PROBLEMS  *BOWEL PROBLEMS  UNUSUAL RASH Items with * indicate a potential emergency and should be followed up as soon as possible.  Feel free to call the clinic should you have any questions or concerns. The clinic phone number is [phone].  Please show the [facility] at check-in to the Emergency Department and triage nurse.   Rituximab injection What is this medicine? RITUXIMAB (ri TUX i mab) is a monoclonal antibody. It is used to treat certain types of cancer like non-Hodgkin lymphoma and chronic lymphocytic leukemia. It is also used to treat rheumatoid arthritis, granulomatosis with polyangiitis (or Wegener's granulomatosis), microscopic polyangiitis, and pemphigus vulgaris. This medicine may be used for other purposes; ask your health care provider or pharmacist if you have questions. COMMON BRAND NAME(S): Rituxan What should I tell my health care provider before I take this medicine? They need to know if you have any of these conditions: -heart disease -infection (especially a virus infection such as hepatitis B, chickenpox, cold sores, or herpes) -immune system problems -irregular heartbeat -kidney disease -low blood counts, like low white cell, platelet, or red cell counts -lung or breathing disease, like asthma -recently received or scheduled to receive a vaccine -an unusual or allergic  reaction to rituximab, other medicines, foods, dyes, or preservatives -pregnant or trying to get pregnant -breast-feeding How should I use this medicine? This medicine is for infusion into a vein. It is administered in a hospital or clinic by a specially trained health care professional. A special MedGuide will be given to you by the pharmacist with each prescription and refill. Be sure to read this information carefully each time. Talk to your pediatrician regarding the use of this medicine in children. This medicine is not approved for use in children. Overdosage: If you think you have taken too much of this medicine contact a poison control center or emergency room at once. NOTE: This medicine is only for you. Do not share this medicine with others. What if I miss a dose? It is important not to miss a dose. Call your doctor or health care professional if you are unable to keep an appointment. What may interact with this medicine? -cisplatin -live virus vaccines This list may not describe all possible interactions. Give your health care provider a list of all the medicines, herbs, non-prescription drugs, or dietary supplements you use. Also tell them if you smoke, drink alcohol, or use illegal drugs. Some items may interact with your medicine. What should I watch for while using this medicine? Your condition will be monitored carefully while you are receiving this medicine. You may need blood work done while you are taking this medicine. This medicine can cause serious allergic reactions. To reduce your risk you may need to take medicine before treatment with this medicine. Take your medicine as directed. In some patients, this medicine may cause a serious brain infection that may cause death. If you have  any problems seeing, thinking, speaking, walking, or standing, tell your healthcare professional right away. If you cannot reach your healthcare professional, urgently seek other source of  medical care. Call your doctor or health care professional for advice if you get a fever, chills or sore throat, or other symptoms of a cold or flu. Do not treat yourself. This drug decreases your body's ability to fight infections. Try to avoid being around people who are sick. Do not become pregnant while taking this medicine or for 12 months after stopping it. Women should inform their doctor if they wish to become pregnant or think they might be pregnant. There is a potential for serious side effects to an unborn child. Talk to your health care professional or pharmacist for more information. Do not breast-feed an infant while taking this medicine or for 6 months after stopping it. What side effects may I notice from receiving this medicine? Side effects that you should report to your doctor or health care professional as soon as possible: -allergic reactions like skin rash, itching or hives; swelling of the face, lips, or tongue -breathing problems -chest pain -changes in vision -diarrhea -headache with fever, neck stiffness, sensitivity to light, nausea, or confusion -fast, irregular heartbeat -loss of memory -low blood counts - this medicine may decrease the number of white blood cells, red blood cells and platelets. You may be at increased risk for infections and bleeding. -mouth sores -problems with balance, talking, or walking -redness, blistering, peeling or loosening of the skin, including inside the mouth -signs of infection - fever or chills, cough, sore throat, pain or difficulty passing urine -signs and symptoms of kidney injury like trouble passing urine or change in the amount of urine -signs and symptoms of liver injury like dark yellow or brown urine; general ill feeling or flu-like symptoms; light-colored stools; loss of appetite; nausea; right upper belly pain; unusually weak or tired; yellowing of the eyes or skin -signs and symptoms of low blood pressure like dizziness;  feeling faint or lightheaded, falls; unusually weak or tired -stomach pain -swelling of the ankles, feet, hands -unusual bleeding or bruising -vomiting Side effects that usually do not require medical attention (report to your doctor or health care professional if they continue or are bothersome): -headache -joint pain -muscle cramps or muscle pain -nausea -tiredness This list may not describe all possible side effects. Call your doctor for medical advice about side effects. You may report side effects to FDA at 1-800-FDA-1088. Where should I keep my medicine? This drug is given in a hospital or clinic and will not be stored at home. NOTE: This sheet is a summary. It may not cover all possible information. If you have questions about this medicine, talk to your doctor, pharmacist, or health care provider.  2019 Elsevier/Gold Standard (2017-09-11 13:04:32)

## 2019-12-16 NOTE — Assessment & Plan Note (Signed)
She has persistent elevated liver enzymes likely due to fatty liver disease We will observe only for now and continue lifestyle modification

## 2019-12-16 NOTE — Assessment & Plan Note (Signed)
She had history of excessive weight gain while on treatment She is interested to lose some weight and medical management for weight loss I spent some time discussing some strategies to help her lose some weight including monitoring of oral intake, order of food, weighing herself daily, getting adequate sleep and oral hydration and also we discussed briefly the role of Metformin to help with obesity, weight loss and prevention of diabetes Ultimately, she would like to try nonmedication lifestyle changes before embarking on taking medications I will reassess her progress in her next visit

## 2019-12-17 LAB — IGM: IgM (Immunoglobulin M), Srm: 14 mg/dL — ABNORMAL LOW (ref 26–217)

## 2020-01-11 ENCOUNTER — Telehealth: Payer: Self-pay | Admitting: *Deleted

## 2020-01-11 NOTE — Telephone Encounter (Signed)
Pt called requesting video visit with Dr.Gorsuch, due to having temps ranign from 103.6-100.5. Pt advise to take COVID test. Pt declined stating she may have had it in Jan. But no COVID test administered. Pt advised to call PCP that prescribed Zpak due to [place] being unavailable. Pt request to talk with Dr.Gorsuch. Message sent to Dr.Gorsuch

## 2020-01-12 ENCOUNTER — Other Ambulatory Visit: Payer: Self-pay

## 2020-01-12 ENCOUNTER — Ambulatory Visit: Payer: Commercial Managed Care - PPO | Attending: Internal Medicine

## 2020-01-12 ENCOUNTER — Telehealth: Payer: Self-pay

## 2020-01-12 DIAGNOSIS — Z20822 Contact with and (suspected) exposure to covid-19: Secondary | ICD-10-CM

## 2020-01-12 NOTE — Telephone Encounter (Signed)
OK I will wait until they have completed their assessment to bring her in

## 2020-01-12 NOTE — Telephone Encounter (Signed)
Called and instructed per Dr. Alvy Bimler, she needs a covid test. Doing a virtual visit does not help especially if she still has fever despite zpak. She verbalized understanding. Given phone number to schedule covid test. She is going to try and call and get tested. If the test is negative she wants to see Dr. Alvy Bimler.  Called respiratory clinic number. They are still accepting patients.

## 2020-01-13 ENCOUNTER — Telehealth: Payer: Commercial Managed Care - PPO | Admitting: Physician Assistant

## 2020-01-13 ENCOUNTER — Encounter: Payer: Self-pay | Admitting: Physician Assistant

## 2020-01-13 DIAGNOSIS — J40 Bronchitis, not specified as acute or chronic: Secondary | ICD-10-CM | POA: Diagnosis not present

## 2020-01-13 LAB — SARS-COV-2, NAA 2 DAY TAT

## 2020-01-13 LAB — NOVEL CORONAVIRUS, NAA: SARS-CoV-2, NAA: NOT DETECTED

## 2020-01-13 MED ORDER — PREDNISONE 5 MG PO TABS: 5.0000 mg | ORAL_TABLET | Freq: Every day | ORAL | 0 refills | Status: DC

## 2020-01-13 MED ORDER — BENZONATATE 100 MG PO CAPS: 100.0000 mg | ORAL_CAPSULE | Freq: Three times a day (TID) | ORAL | 0 refills | Status: DC | PRN

## 2020-01-13 NOTE — Progress Notes (Signed)
We are sorry that you are not feeling well.  Here is how we plan to help!  Based on your presentation I believe you most likely have A cough due to bacteria.  When patients have a fever and a productive cough with a change in color or increased sputum production, we are concerned about bacterial bronchitis.  If left untreated it can progress to pneumonia.  If your symptoms do not improve with your treatment plan it is important that you contact your provider.    In addition you may use A prescription cough medication called Tessalon Perles 100mg . You may take 1-2 capsules every 8 hours as needed for your cough.  Prednisone 10 mg daily for 6 days (see taper instructions below)  Directions for 6 day taper: Day 1: 2 tablets before breakfast, 1 after both lunch & dinner and 2 at bedtime Day 2: 1 tab before breakfast, 1 after both lunch & dinner and 2 at bedtime Day 3: 1 tab at each meal & 1 at bedtime Day 4: 1 tab at breakfast, 1 at lunch, 1 at bedtime Day 5: 1 tab at breakfast & 1 tab at bedtime Day 6: 1 tab at breakfast   You have indicated that you recently have taken Azithromycin for your cough. If after the prescribed medication your symptoms persist or if you develop any new symptoms, please follow up for a face to face evaluation.   From your responses in the eVisit questionnaire you describe inflammation in the upper respiratory tract which is causing a significant cough.  This is commonly called Bronchitis and has four common causes:    Allergies  Viral Infections  Acid Reflux  Bacterial Infection Allergies, viruses and acid reflux are treated by controlling symptoms or eliminating the cause. An example might be a cough caused by taking certain blood pressure medications. You stop the cough by changing the medication. Another example might be a cough caused by acid reflux. Controlling the reflux helps control the cough.  USE OF BRONCHODILATOR ("RESCUE") INHALERS: There is a risk  from using your bronchodilator too frequently.  The risk is that over-reliance on a medication which only relaxes the muscles surrounding the breathing tubes can reduce the effectiveness of medications prescribed to reduce swelling and congestion of the tubes themselves.  Although you feel brief relief from the bronchodilator inhaler, your asthma may actually be worsening with the tubes becoming more swollen and filled with mucus.  This can delay other crucial treatments, such as oral steroid medications. If you need to use a bronchodilator inhaler daily, several times per day, you should discuss this with your provider.  There are probably better treatments that could be used to keep your asthma under control.     HOME CARE . Only take medications as instructed by your medical team. . Complete the entire course of an antibiotic. . Drink plenty of fluids and get plenty of rest. . Avoid close contacts especially the very young and the elderly . Cover your mouth if you cough or cough into your sleeve. . Always remember to wash your hands . A steam or ultrasonic humidifier can help congestion.   GET HELP RIGHT AWAY IF: . You develop worsening fever. . You become short of breath . You cough up blood. . Your symptoms persist after you have completed your treatment plan MAKE SURE YOU   Understand these instructions.  Will watch your condition.  Will get help right away if you are not doing well or  get worse.  Your e-visit answers were reviewed by a board certified advanced clinical practitioner to complete your personal care plan.  Depending on the condition, your plan could have included both over the counter or prescription medications. If there is a problem please reply  once you have received a response from your provider. Your safety is important to [place].  If you have drug allergies check your prescription carefully.    You can use MyChart to ask questions about today's visit, request a  non-urgent call back, or ask for a work or school excuse for 24 hours related to this e-Visit. If it has been greater than 24 hours you will need to follow up with your provider, or enter a new e-Visit to address those concerns. You will get an e-mail in the next two days asking about your experience.  I hope that your e-visit has been valuable and will speed your recovery. Thank you for using e-visits.  I spent 5-10 minutes on review and completion of this note- Lacy Duverney [facility]

## 2020-01-16 ENCOUNTER — Telehealth: Payer: Self-pay

## 2020-01-16 NOTE — Telephone Encounter (Signed)
RN spoke with patient to follow up.  Pt reports she went to urgent care on 4/3 and had CXR and was diagnosed with pneumonia.  Pt is currently on Levaquin X 7 days.   Pt denies any current fever.  Continues with cough, managed by Tessalon perles per pt.  Pt tolerating fluids and small meals well.  Pt c/o sore throat from coughing, RN encouraged warm salt water rinses and hydration.   Pt will notify MD if symptoms due not subside throughout week.  No further needs at this time.

## 2020-01-16 NOTE — Telephone Encounter (Signed)
-----   Message from Heath Lark, MD sent at 01/16/2020  7:51 AM EDT ----- Regarding: FW: how is she doing?  ----- Message ----- From: Heath Lark, MD Sent: 01/16/2020   7:49 AM EDT To: Flo Shanks, RN Subject: how is she doing?                              I saw she had multiple testing and was put on antibiotics Is she feeling better? Does she still need to be seen this week? I can see her on Friday is needed

## 2020-01-18 ENCOUNTER — Telehealth: Payer: Self-pay

## 2020-01-18 NOTE — Telephone Encounter (Signed)
Returned call to [employer]. She has already took her Levaquin today and she has started having swelling in her feet now. She had a fever last night and this morning . She was diagnosed with pneumonia recently and has 2 days left of the Levaquin. Requesting appt. Scheduled for 8:45 in the am. She is aware of the time.

## 2020-01-19 ENCOUNTER — Telehealth: Payer: Self-pay

## 2020-01-19 ENCOUNTER — Ambulatory Visit (HOSPITAL_COMMUNITY)
Admission: RE | Admit: 2020-01-19 | Discharge: 2020-01-19 | Disposition: A | Discharge status: Home / Self Care | Payer: Commercial Managed Care - PPO | Source: Ambulatory Visit | Attending: Hematology and Oncology | Admitting: Hematology and Oncology

## 2020-01-19 ENCOUNTER — Encounter: Payer: Self-pay | Admitting: Hematology and Oncology

## 2020-01-19 ENCOUNTER — Other Ambulatory Visit: Payer: Self-pay

## 2020-01-19 ENCOUNTER — Inpatient Hospital Stay: Payer: Commercial Managed Care - PPO | Attending: Hematology and Oncology | Admitting: Hematology and Oncology

## 2020-01-19 ENCOUNTER — Inpatient Hospital Stay: Payer: Commercial Managed Care - PPO

## 2020-01-19 DIAGNOSIS — D801 Nonfamilial hypogammaglobulinemia: Secondary | ICD-10-CM | POA: Diagnosis not present

## 2020-01-19 DIAGNOSIS — D84821 Immunodeficiency due to drugs: Secondary | ICD-10-CM | POA: Insufficient documentation

## 2020-01-19 DIAGNOSIS — R5381 Other malaise: Secondary | ICD-10-CM | POA: Insufficient documentation

## 2020-01-19 DIAGNOSIS — R05 Cough: Secondary | ICD-10-CM | POA: Diagnosis present

## 2020-01-19 DIAGNOSIS — Z791 Long term (current) use of non-steroidal anti-inflammatories (NSAID): Secondary | ICD-10-CM | POA: Diagnosis not present

## 2020-01-19 DIAGNOSIS — E559 Vitamin D deficiency, unspecified: Secondary | ICD-10-CM | POA: Insufficient documentation

## 2020-01-19 DIAGNOSIS — R600 Localized edema: Secondary | ICD-10-CM | POA: Insufficient documentation

## 2020-01-19 DIAGNOSIS — R059 Cough, unspecified: Secondary | ICD-10-CM

## 2020-01-19 DIAGNOSIS — C8307 Small cell B-cell lymphoma, spleen: Secondary | ICD-10-CM

## 2020-01-19 DIAGNOSIS — Z7952 Long term (current) use of systemic steroids: Secondary | ICD-10-CM | POA: Insufficient documentation

## 2020-01-19 DIAGNOSIS — J189 Pneumonia, unspecified organism: Secondary | ICD-10-CM | POA: Insufficient documentation

## 2020-01-19 DIAGNOSIS — Z9221 Personal history of antineoplastic chemotherapy: Secondary | ICD-10-CM | POA: Diagnosis not present

## 2020-01-19 DIAGNOSIS — Z79899 Other long term (current) drug therapy: Secondary | ICD-10-CM | POA: Diagnosis not present

## 2020-01-19 LAB — COMPREHENSIVE METABOLIC PANEL
ALT: 24 U/L (ref 0–44)
AST: 23 U/L (ref 15–41)
Albumin: 3 g/dL — ABNORMAL LOW (ref 3.5–5.0)
Alkaline Phosphatase: 91 U/L (ref 38–126)
Anion gap: 9 (ref 5–15)
BUN: 11 mg/dL (ref 6–20)
CO2: 28 mmol/L (ref 22–32)
Calcium: 8.9 mg/dL (ref 8.9–10.3)
Chloride: 103 mmol/L (ref 98–111)
Creatinine, Ser: 0.82 mg/dL (ref 0.44–1.00)
GFR calc Af Amer: >60 mL/min (ref >60)
GFR calc non Af Amer: >60 mL/min (ref >60)
Glucose, Bld: 92 mg/dL (ref 70–99)
Potassium: 4.1 mmol/L (ref 3.5–5.1)
Sodium: 140 mmol/L (ref 135–145)
Total Bilirubin: 0.9 mg/dL (ref 0.3–1.2)
Total Protein: 6.2 g/dL — ABNORMAL LOW (ref 6.5–8.1)

## 2020-01-19 LAB — CBC WITH DIFFERENTIAL/PLATELET
Abs Immature Granulocytes: 0.09 10*3/uL — ABNORMAL HIGH (ref 0.00–0.07)
Basophils Absolute: 0 10*3/uL (ref 0.0–0.1)
Basophils Relative: 0 %
Eosinophils Absolute: 0.2 10*3/uL (ref 0.0–0.5)
Eosinophils Relative: 3 %
HCT: 31.3 % — ABNORMAL LOW (ref 36.0–46.0)
Hemoglobin: 10.1 g/dL — ABNORMAL LOW (ref 12.0–15.0)
Immature Granulocytes: 2 %
Lymphocytes Relative: 5 %
Lymphs Abs: 0.3 10*3/uL — ABNORMAL LOW (ref 0.7–4.0)
MCH: 28.6 pg (ref 26.0–34.0)
MCHC: 32.3 g/dL (ref 30.0–36.0)
MCV: 88.7 fL (ref 80.0–100.0)
Monocytes Absolute: 0.3 10*3/uL (ref 0.1–1.0)
Monocytes Relative: 6 %
Neutro Abs: 4.8 10*3/uL (ref 1.7–7.7)
Neutrophils Relative %: 84 %
Platelets: 259 10*3/uL (ref 150–400)
RBC: 3.53 MIL/uL — ABNORMAL LOW (ref 3.87–5.11)
RDW: 13.4 % (ref 11.5–15.5)
WBC: 5.7 10*3/uL (ref 4.0–10.5)
nRBC: 0 % (ref 0.0–0.2)

## 2020-01-19 MED ORDER — IOHEXOL 300 MG/ML  SOLN
75.0000 mL | Freq: Once | INTRAMUSCULAR | Status: AC | PRN
  Administered 2020-01-19: 16:00:00 75 mL via INTRAVENOUS

## 2020-01-19 NOTE — Assessment & Plan Note (Signed)
According to the patient, she has significant exacerbation of leg swelling recently Examination today only showed trace lower extremity edema I will order repeat blood work today for further evaluation

## 2020-01-19 NOTE — Telephone Encounter (Signed)
Called and instructed to go to [facility] radiology today at 3:45pm for CT, for at 4 pm appt. She verbalized understanding.

## 2020-01-19 NOTE — Assessment & Plan Note (Signed)
With unresolved pneumonia, I am concerned whether we are dealing with lymphoma relapse versus something else I recommend CT imaging and repeat blood work today and she agreed

## 2020-01-19 NOTE — Progress Notes (Signed)
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[place]) With unresolved pneumonia, I am concerned whether we are dealing with lymphoma relapse versus something else I recommend CT imaging and repeat blood work today and she agreed  Right lower lobe pneumonia She was diagnosed with pneumonia from urgent care evaluation The patient did not take prednisone therapy After completion of levofloxacin, she continues to have low-grade fever She is immunocompromised due to rituximab treatment I recommend urgent CT evaluation to rule out other causes including possibility of lymphoma progression In the meantime, I recommend her to continue on cough syrup and I gave her a letter to take time off work until further evaluation is completed  Bilateral leg edema According to the patient, she has significant exacerbation of leg swelling recently Examination today only showed trace lower extremity edema I will order repeat blood work today for further evaluation  Cough in adult She was prescribed 2 different cough medicine by 2 different doctors I recommend her to continue [employer] which she found helpful along with the Tussionex as needed for symptom relief   No orders of the defined types were placed in this encounter.   All questions were answered. The patient knows to call the clinic with any problems, questions or concerns. The total time spent in the appointment was 30 minutes encounter with patients including review of chart and various tests results, discussions about plan of care and coordination of care plan   Heath Lark, MD 01/19/2020 9:39 AM  INTERVAL HISTORY: Please see below for problem oriented charting. She returns for further follow-up and urgent evaluation due to unresolved fever,  cough and shortness of breath She was recently tested for Covid and it was negative She went to urgent care and was prescribed levofloxacin along with some cough syrup and inhalers The previous physician has prescribed prednisone which she did not take.  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 8 mm in short axis right level I a lymph node with Deauville 4 activity, increased from prior. 2. Small but mildly hypermetabolic left axillary lymph node with Deauville 4 activity. 3. Splenic activity only minimally greater than that of the liver, but with a moderate amount of splenomegaly (splenic volume 680 cubic cm). No focal splenic lesion observed. 4. Diffuse mildly increased activity in the skeleton without corresponding CT abnormality. Possibilities might include low-level marrow infiltration or granulocyte stimulation.   07/02/2018 - 11/26/2018 Chemotherapy   The patient had rituximab for chemotherapy treatment.     07/02/2018 - 11/26/2018 Chemotherapy   The patient had rituximab for chemotherapy treatment.     12/30/2018 Cancer Staging   Staging form: Hodgkin and Non-Hodgkin Lymphoma, AJCC 7th Edition -  Clinical: S - Spleen, B - Symptoms - Signed by Heath Lark, MD on 12/30/2018   05/26/2019 Imaging   1. No findings in the chest, abdomen or pelvis to  suggest recurrent disease. 2. No acute findings in the chest, abdomen or pelvis. 3. Mild atherosclerosis in the pelvic vasculature. 4. Additional incidental findings, as above.     REVIEW OF SYSTEMS:   Eyes: Denies blurriness of vision Ears, nose, mouth, throat, and face: Denies mucositis or sore throat Gastrointestinal:  Denies nausea, heartburn or change in bowel habits Skin: Denies abnormal skin rashes Lymphatics: Denies new lymphadenopathy or easy bruising Neurological:Denies numbness, tingling or new weaknesses Behavioral/Psych: Mood is stable, no new changes  All other systems were reviewed with the patient and are negative.  I have reviewed the past medical history, past surgical history, social history and family history with the patient and they are unchanged from previous note.  ALLERGIES:  has No Known Allergies.  MEDICATIONS:  Current Outpatient Medications  Medication Sig Dispense Refill  . benzonatate (TESSALON) 100 MG capsule Take 1 capsule (100 mg total) by mouth 3 (three) times daily as needed. 20 capsule 0  . fluticasone (FLONASE) 50 MCG/ACT nasal spray PLACE 1-2 SPRAYS INTO BOTH NOSTRILS DAILY AS NEEDED FOR RHINITIS. 16 mL 1  . furosemide (LASIX) 20 MG tablet Take 1 tablet (20 mg total) by mouth daily. 30 tablet 9  . ibuprofen (ADVIL,MOTRIN) 200 MG tablet Take 400-800 mg by mouth every 6 (six) hours as needed for headache, mild pain or moderate pain.     . pantoprazole (PROTONIX) 20 MG tablet TAKE 1 TABLET BY MOUTH EVERY DAY 90 tablet 2  . Potassium Chloride ER 20 MEQ TBCR TAKE 1 TABLET BY MOUTH EVERY DAY 90 tablet 1   No current facility-administered medications for this visit.    PHYSICAL EXAMINATION: ECOG PERFORMANCE STATUS: 1 - Symptomatic but completely ambulatory  Vitals:   01/19/20 0916  BP: 117/63  Pulse: 83  Resp: 18  Temp: 98.9 F (37.2 C)  SpO2: 98%   Filed Weights   01/19/20 0916  Weight: 202 lb (91.6 kg)    GENERAL:alert, no distress  and comfortable SKIN: skin color, texture, turgor are normal, no rashes or significant lesions EYES: normal, Conjunctiva are pink and non-injected, sclera clear OROPHARYNX:no exudate, no erythema and lips, buccal mucosa, and tongue normal  NECK: supple, thyroid normal size, non-tender, without nodularity LYMPH:  no palpable lymphadenopathy in the cervical, axillary or inguinal LUNGS: clear to auscultation and percussion with normal breathing effort HEART: regular rate & rhythm and no murmurs with mild lower extremity edema ABDOMEN:abdomen soft, non-tender and normal bowel sounds Musculoskeletal:no cyanosis of digits and no clubbing  NEURO: alert & oriented x 3 with fluent speech, no focal motor/sensory deficits  LABORATORY DATA:  I have reviewed the data as listed    Component Value Date/Time   NA 137 12/16/2019 1222   NA 138 12/08/2018 0931   NA 139 01/30/2015 0924   K 3.9 12/16/2019 1222   K 4.8 01/30/2015 0924   CL 103 12/16/2019 1222   CO2 24 12/16/2019 1222   CO2 25 01/30/2015 0924   GLUCOSE 109 (H) 12/16/2019 1222   GLUCOSE 101 01/30/2015 0924   BUN 15 12/16/2019 1222   BUN 12 12/08/2018 0931   BUN 11.5 01/30/2015 0924   CREATININE 0.87 12/16/2019 1222   CREATININE 0.8 01/30/2015 0924   CALCIUM 9.0 12/16/2019 1222   CALCIUM 9.1 01/30/2015 0924   PROT 6.3 (L)  12/16/2019 1222   PROT 5.8 (L) 12/08/2018 0931   PROT 7.0 01/30/2015 0924   ALBUMIN 3.9 12/16/2019 1222   ALBUMIN 4.1 12/08/2018 0931   ALBUMIN 4.2 01/30/2015 0924   AST 31 12/16/2019 1222   AST 21 01/30/2015 0924   ALT 91 (H) 12/16/2019 1222   ALT 27 01/30/2015 0924   ALKPHOS 86 12/16/2019 1222   ALKPHOS 77 01/30/2015 0924   BILITOT 1.3 (H) 12/16/2019 1222   BILITOT 0.6 12/08/2018 0931   BILITOT 0.48 01/30/2015 0924   GFRNONAA >60 12/16/2019 1222   GFRNONAA 76 02/14/2014 1105   GFRAA >60 12/16/2019 1222   GFRAA 88 02/14/2014 1105    No results found for: SPEP, UPEP  Lab Results  Component Value Date    WBC 4.9 12/16/2019   NEUTROABS 3.7 12/16/2019   HGB 13.4 12/16/2019   HCT 38.5 12/16/2019   MCV 87.7 12/16/2019   PLT 150 12/16/2019      Chemistry      Component Value Date/Time   NA 137 12/16/2019 1222   NA 138 12/08/2018 0931   NA 139 01/30/2015 0924   K 3.9 12/16/2019 1222   K 4.8 01/30/2015 0924   CL 103 12/16/2019 1222   CO2 24 12/16/2019 1222   CO2 25 01/30/2015 0924   BUN 15 12/16/2019 1222   BUN 12 12/08/2018 0931   BUN 11.5 01/30/2015 0924   CREATININE 0.87 12/16/2019 1222   CREATININE 0.8 01/30/2015 0924      Component Value Date/Time   CALCIUM 9.0 12/16/2019 1222   CALCIUM 9.1 01/30/2015 0924   ALKPHOS 86 12/16/2019 1222   ALKPHOS 77 01/30/2015 0924   AST 31 12/16/2019 1222   AST 21 01/30/2015 0924   ALT 91 (H) 12/16/2019 1222   ALT 27 01/30/2015 0924   BILITOT 1.3 (H) 12/16/2019 1222   BILITOT 0.6 12/08/2018 0931   BILITOT 0.48 01/30/2015 7026

## 2020-01-19 NOTE — Telephone Encounter (Signed)
Called and scheduled appt with Dr. Alvy Bimler for 1130 tomorrow. She verbalized understanding.

## 2020-01-19 NOTE — Assessment & Plan Note (Signed)
She was diagnosed with pneumonia from urgent care evaluation The patient did not take prednisone therapy After completion of levofloxacin, she continues to have low-grade fever She is immunocompromised due to rituximab treatment I recommend urgent CT evaluation to rule out other causes including possibility of lymphoma progression In the meantime, I recommend her to continue on cough syrup and I gave her a letter to take time off work until further evaluation is completed

## 2020-01-19 NOTE — Assessment & Plan Note (Signed)
She was prescribed 2 different cough medicine by 2 different doctors I recommend her to continue [employer] which she found helpful along with the Tussionex as needed for symptom relief

## 2020-01-20 ENCOUNTER — Encounter: Payer: Self-pay | Admitting: Hematology and Oncology

## 2020-01-20 ENCOUNTER — Other Ambulatory Visit: Payer: Self-pay

## 2020-01-20 ENCOUNTER — Inpatient Hospital Stay (HOSPITAL_BASED_OUTPATIENT_CLINIC_OR_DEPARTMENT_OTHER): Payer: Commercial Managed Care - PPO | Admitting: Hematology and Oncology

## 2020-01-20 DIAGNOSIS — J189 Pneumonia, unspecified organism: Secondary | ICD-10-CM

## 2020-01-20 DIAGNOSIS — R5381 Other malaise: Secondary | ICD-10-CM | POA: Diagnosis not present

## 2020-01-20 DIAGNOSIS — D801 Nonfamilial hypogammaglobulinemia: Secondary | ICD-10-CM | POA: Diagnosis not present

## 2020-01-20 DIAGNOSIS — C8307 Small cell B-cell lymphoma, spleen: Secondary | ICD-10-CM | POA: Diagnosis not present

## 2020-01-20 LAB — IGG, IGA, IGM
IgA: 111 mg/dL (ref 87–352)
IgG (Immunoglobin G), Serum: 479 mg/dL — ABNORMAL LOW (ref 586–1602)
IgM (Immunoglobulin M), Srm: 15 mg/dL — ABNORMAL LOW (ref 26–217)

## 2020-01-20 MED ORDER — DOXYCYCLINE HYCLATE 100 MG PO TABS: 100.0000 mg | ORAL_TABLET | Freq: Two times a day (BID) | ORAL | 0 refills | Status: DC

## 2020-01-20 MED ORDER — PANTOPRAZOLE SODIUM 20 MG PO TBEC: 20.0000 mg | DELAYED_RELEASE_TABLET | Freq: Every day | ORAL | 2 refills | Status: DC

## 2020-01-20 NOTE — Progress Notes (Signed)
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[place]) From the lymphoma standpoint, there is no evidence of disease The small lymph node seen on CT imaging is not due to disease, likely reactive Observe for now We discussed the risk and benefits of discontinuation of rituximab  Right lower lobe pneumonia CT imaging studies show persistent consolidation likely secondary to recent pneumonia There are also some abnormality seen on the right middle lobe I plan to repeat imaging study again in 3 months We discussed the benefits of another course of antibiotics along with prednisone therapy I plan to see her again at the end of next week for further follow-up  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   All questions were answered. The patient knows to call the clinic with any problems, questions or concerns. The total time spent in the appointment was 30 minutes encounter with patients including review of chart and various tests results, discussions about plan of care and coordination of care plan   Heath Lark, MD 01/20/2020 1:01 PM  INTERVAL HISTORY: Please see below for problem oriented charting. 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 8 mm in short axis right level I a lymph node with Deauville 4 activity, increased from prior. 2. Small but mildly hypermetabolic left axillary lymph node with Deauville 4 activity. 3. Splenic activity only minimally greater than that of the liver, but with a moderate amount of splenomegaly (splenic volume 680 cubic cm). No focal splenic lesion observed. 4. Diffuse mildly increased activity in the skeleton without corresponding CT abnormality. Possibilities might include low-level marrow infiltration or granulocyte stimulation.   07/02/2018 - 11/26/2018 Chemotherapy   The patient had rituximab for chemotherapy treatment.     07/02/2018 - 11/26/2018 Chemotherapy   The patient had rituximab for chemotherapy treatment.     12/30/2018 Cancer Staging   Staging form: Hodgkin and Non-Hodgkin Lymphoma, AJCC 7th Edition - Clinical: S - Spleen, B - Symptoms - Signed by Heath Lark, MD on 12/30/2018   05/26/2019 Imaging   1. No findings in the chest, abdomen or pelvis to suggest recurrent disease. 2. No acute findings in the chest, abdomen or pelvis. 3. Mild atherosclerosis in the pelvic vasculature. 4. Additional incidental findings, as above.   01/19/2020 Imaging   1. Focal consolidative opacity in the superior segment right lower lobe  with patchy and nodular ground-glass and confluent airspace disease scattered throughout the right lower lobe and right middle lobe. Imaging features compatible with multifocal pneumonia. Follow-up CT chest may be warranted to ensure  resolution. 2. Mild right hilar lymphadenopathy, likely reactive.       REVIEW OF SYSTEMS:   Eyes: Denies blurriness of vision Ears, nose, mouth, throat, and face: Denies mucositis or sore throat Cardiovascular: Denies palpitation, chest discomfort or lower extremity swelling Gastrointestinal:  Denies nausea, heartburn or change in bowel habits Skin: Denies abnormal skin rashes Lymphatics: Denies new lymphadenopathy or easy bruising Neurological:Denies numbness, tingling or new weaknesses Behavioral/Psych: Mood is stable, no new changes  All other systems were reviewed with the patient and are negative.  I have reviewed the past medical history, past surgical history, social history and family history with the patient and they are unchanged from previous note.  ALLERGIES:  has No Known Allergies.  MEDICATIONS:  Current Outpatient Medications  Medication Sig Dispense Refill  . benzonatate (TESSALON) 100 MG capsule Take 1 capsule (100 mg total) by mouth 3 (three) times daily as needed. 20 capsule 0  . doxycycline (VIBRA-TABS) 100 MG tablet Take 1 tablet (100 mg total) by mouth 2 (two) times daily. 14 tablet 0  . fluticasone (FLONASE) 50 MCG/ACT nasal spray PLACE 1-2 SPRAYS INTO BOTH NOSTRILS DAILY AS NEEDED FOR RHINITIS. 16 mL 1  . furosemide (LASIX) 20 MG tablet Take 1 tablet (20 mg total) by mouth daily. 30 tablet 9  . ibuprofen (ADVIL,MOTRIN) 200 MG tablet Take 400-800 mg by mouth every 6 (six) hours as needed for headache, mild pain or moderate pain.     . pantoprazole (PROTONIX) 20 MG tablet Take 1 tablet (20 mg total) by mouth daily. 90 tablet 2  . Potassium Chloride ER 20 MEQ TBCR TAKE 1 TABLET BY MOUTH EVERY DAY 90 tablet 1   No current facility-administered medications for this visit.    PHYSICAL EXAMINATION: ECOG PERFORMANCE STATUS: 1 - Symptomatic but completely ambulatory  Vitals:   01/20/20 1219  BP: 129/61  Pulse: 88  Resp: 18  Temp: 98.7 F (37.1 C)  SpO2:  98%   Filed Weights   01/20/20 1219  Weight: 204 lb (92.5 kg)    GENERAL:alert, no distress and comfortable NEURO: alert & oriented x 3 with fluent speech, no focal motor/sensory deficits  LABORATORY DATA:  I have reviewed the data as listed    Component Value Date/Time   NA 140 01/19/2020 0945   NA 138 12/08/2018 0931   NA 139 01/30/2015 0924   K 4.1 01/19/2020 0945   K 4.8 01/30/2015 0924   CL 103 01/19/2020 0945   CO2 28 01/19/2020 0945   CO2 25 01/30/2015 0924   GLUCOSE 92 01/19/2020 0945   GLUCOSE 101 01/30/2015 0924   BUN 11 01/19/2020 0945   BUN 12 12/08/2018 0931   BUN 11.5 01/30/2015 0924   CREATININE 0.82 01/19/2020 0945   CREATININE 0.8 01/30/2015 0924   CALCIUM 8.9 01/19/2020 0945   CALCIUM 9.1 01/30/2015 0924   PROT 6.2 (L) 01/19/2020 0945   PROT 5.8 (L) 12/08/2018 0931   PROT 7.0 01/30/2015 0924   ALBUMIN 3.0 (L) 01/19/2020 0945   ALBUMIN 4.1 12/08/2018 0931   ALBUMIN 4.2 01/30/2015 0924   AST 23 01/19/2020 0945   AST 21 01/30/2015 0924   ALT 24 01/19/2020 0945   ALT 27 01/30/2015 0924   ALKPHOS 91 01/19/2020 0945   ALKPHOS 77 01/30/2015 0924  BILITOT 0.9 01/19/2020 0945   BILITOT 0.6 12/08/2018 0931   BILITOT 0.48 01/30/2015 0924   GFRNONAA >60 01/19/2020 0945   GFRNONAA 76 02/14/2014 1105   GFRAA >60 01/19/2020 0945   GFRAA 88 02/14/2014 1105    No results found for: SPEP, UPEP  Lab Results  Component Value Date   WBC 5.7 01/19/2020   NEUTROABS 4.8 01/19/2020   HGB 10.1 (L) 01/19/2020   HCT 31.3 (L) 01/19/2020   MCV 88.7 01/19/2020   PLT 259 01/19/2020      Chemistry      Component Value Date/Time   NA 140 01/19/2020 0945   NA 138 12/08/2018 0931   NA 139 01/30/2015 0924   K 4.1 01/19/2020 0945   K 4.8 01/30/2015 0924   CL 103 01/19/2020 0945   CO2 28 01/19/2020 0945   CO2 25 01/30/2015 0924   BUN 11 01/19/2020 0945   BUN 12 12/08/2018 0931   BUN 11.5 01/30/2015 0924   CREATININE 0.82 01/19/2020 0945   CREATININE 0.8  01/30/2015 0924      Component Value Date/Time   CALCIUM 8.9 01/19/2020 0945   CALCIUM 9.1 01/30/2015 0924   ALKPHOS 91 01/19/2020 0945   ALKPHOS 77 01/30/2015 0924   AST 23 01/19/2020 0945   AST 21 01/30/2015 0924   ALT 24 01/19/2020 0945   ALT 27 01/30/2015 0924   BILITOT 0.9 01/19/2020 0945   BILITOT 0.6 12/08/2018 0931   BILITOT 0.48 01/30/2015 0924       RADIOGRAPHIC STUDIES: I have reviewed imaging studies with the patient and blood work I have personally reviewed the radiological images as listed and agreed with the findings in the report. CT CHEST W CONTRAST  Result Date: 01/19/2020 CLINICAL DATA:  Pneumonia. History of lymphoma. EXAM: CT CHEST WITH CONTRAST TECHNIQUE: Multidetector CT imaging of the chest was performed during intravenous contrast administration. CONTRAST:  85m OMNIPAQUE IOHEXOL 300 MG/ML  SOLN COMPARISON:  05/26/2019 FINDINGS: Cardiovascular: The heart size is normal. No substantial pericardial effusion. No thoracic aortic aneurysm. Mediastinum/Nodes: Small mediastinal lymph nodes evident. 12 mm short axis right hilar node is new in the interval. No left hilar lymphadenopathy. The esophagus has normal imaging features. There is no axillary lymphadenopathy. Lungs/Pleura: Focal consolidative opacity is identified in the superior segment right lower lobe with patchy and nodular ground-glass and confluent airspace disease scattered throughout the right lower lobe. Nodular areas of confluent consolidative opacity are seen in the right middle lobe. Left lung clear. No pleural effusion. Upper Abdomen: Unremarkable. Musculoskeletal: No worrisome lytic or sclerotic osseous abnormality. IMPRESSION: 1. Focal consolidative opacity in the superior segment right lower lobe with patchy and nodular ground-glass and confluent airspace disease scattered throughout the right lower lobe and right middle lobe. Imaging features compatible with multifocal pneumonia. Follow-up CT chest may be  warranted to ensure resolution. 2. Mild right hilar lymphadenopathy, likely reactive. Electronically Signed   By: EMisty StanleyM.D.   On: 01/19/2020 16:58

## 2020-01-20 NOTE — Assessment & Plan Note (Signed)
From the lymphoma standpoint, there is no evidence of disease The small lymph node seen on CT imaging is not due to disease, likely reactive Observe for now We discussed the risk and benefits of discontinuation of rituximab

## 2020-01-20 NOTE — Assessment & Plan Note (Signed)
She is extremely debilitated with shortness of breath and fever I recommend her not to resume work until I see her next week

## 2020-01-20 NOTE — Assessment & Plan Note (Signed)
CT imaging studies show persistent consolidation likely secondary to recent pneumonia There are also some abnormality seen on the right middle lobe I plan to repeat imaging study again in 3 months We discussed the benefits of another course of antibiotics along with prednisone therapy I plan to see her again at the end of next week for further follow-up

## 2020-01-20 NOTE — Assessment & Plan Note (Signed)
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

## 2020-01-23 ENCOUNTER — Telehealth: Payer: Self-pay | Admitting: Hematology and Oncology

## 2020-01-23 NOTE — Telephone Encounter (Signed)
Scheduled appts per 4/9 sch msg. Pt confirmed next appt date and time.

## 2020-01-26 ENCOUNTER — Telehealth: Payer: Self-pay

## 2020-01-26 ENCOUNTER — Other Ambulatory Visit: Payer: Self-pay | Admitting: Hematology and Oncology

## 2020-01-26 MED ORDER — PREDNISONE 20 MG PO TABS: 20.0000 mg | ORAL_TABLET | Freq: Every day | ORAL | 0 refills | Status: DC

## 2020-01-26 NOTE — Telephone Encounter (Signed)
Called and given below message. She verbalized understanding. 

## 2020-01-26 NOTE — Telephone Encounter (Signed)
I sent the new prescription.

## 2020-01-26 NOTE — Telephone Encounter (Signed)
She called and left a message. She got her Doxycycline and Protonix Rx from [facility] last Friday. She never got the Prednisone 20 mg Rx that she is supposed to take. [facility] said they never got the Rx. She had a old Rx and has been taking the Prednisone 20 mg. She needs Rx to finish out the 7 days sent to [facility].

## 2020-01-27 ENCOUNTER — Other Ambulatory Visit: Payer: Self-pay

## 2020-01-27 ENCOUNTER — Inpatient Hospital Stay (HOSPITAL_BASED_OUTPATIENT_CLINIC_OR_DEPARTMENT_OTHER): Payer: Commercial Managed Care - PPO | Admitting: Hematology and Oncology

## 2020-01-27 ENCOUNTER — Encounter: Payer: Self-pay | Admitting: Hematology and Oncology

## 2020-01-27 DIAGNOSIS — C8307 Small cell B-cell lymphoma, spleen: Secondary | ICD-10-CM | POA: Diagnosis not present

## 2020-01-27 DIAGNOSIS — J189 Pneumonia, unspecified organism: Secondary | ICD-10-CM | POA: Diagnosis not present

## 2020-01-27 DIAGNOSIS — E559 Vitamin D deficiency, unspecified: Secondary | ICD-10-CM | POA: Diagnosis not present

## 2020-01-27 DIAGNOSIS — D801 Nonfamilial hypogammaglobulinemia: Secondary | ICD-10-CM | POA: Diagnosis not present

## 2020-01-27 NOTE — Assessment & Plan Note (Signed)
She has history of severe vitamin D deficiency I recommend vitamin D supplement I plan to check a serum vitamin D level in 2 weeks

## 2020-01-27 NOTE — Assessment & Plan Note (Signed)
We discussed the risk and benefits of IVIG and at this point in time, I do not believe she would need IVIG treatment We will continue close monitoring

## 2020-01-27 NOTE — Progress Notes (Signed)
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[place]) From the lymphoma standpoint, there is no evidence of disease The small lymph node seen on CT imaging is not due to disease, likely reactive Observe for now We discussed the risk and benefits of discontinuation of rituximab She has been disease-free for a long time and currently, I felt that the risk of rituximab treatment outweighs the benefit and both the patient and her husband agreed We will discontinue rituximab I will see her again in 2 weeks for further follow-up and supportive care  Right lower lobe pneumonia Overall, his symptoms has improved She will finish her course of doxycycline and prednisone I recommend her to rest as much as possible I will see her again in 2 weeks for further follow-up  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 VITAMIN D 25 Hydroxy (Vit-D Deficiency, Fractures)    Standing Status:   Future    Standing Expiration Date:   01/26/2021    All questions were answered. The patient knows to call the clinic with any problems, questions or concerns. The total time spent in the appointment was 20 minutes encounter with patients including review of chart and various tests results, discussions about plan of care and coordination of care plan   Heath Lark, MD 01/27/2020 1:58 PM  INTERVAL HISTORY: Please see below for problem oriented charting. 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 8 mm in short axis right level I a lymph node with Deauville 4 activity, increased from prior. 2. Small but mildly hypermetabolic left axillary lymph node with Deauville 4 activity. 3. Splenic activity only minimally greater than that of the liver, but with a moderate amount of splenomegaly (splenic volume 680 cubic cm). No focal splenic lesion observed. 4. Diffuse mildly increased activity in the skeleton without corresponding CT abnormality. Possibilities might include low-level marrow infiltration or granulocyte stimulation.   07/02/2018 - 11/26/2018 Chemotherapy   The patient had rituximab for chemotherapy treatment.     07/02/2018 - 11/26/2018 Chemotherapy   The patient had rituximab for chemotherapy treatment.     12/30/2018 Cancer Staging   Staging form: Hodgkin and Non-Hodgkin Lymphoma, AJCC 7th Edition - Clinical: S - Spleen, B -  Symptoms - Signed by Heath Lark, MD on 12/30/2018   05/26/2019 Imaging   1. No findings in the chest, abdomen or pelvis to suggest recurrent disease. 2. No acute findings in the chest, abdomen or pelvis. 3. Mild atherosclerosis in the pelvic vasculature. 4. Additional incidental findings, as above.    01/19/2020 Imaging   1. Focal consolidative opacity in the superior segment right lower lobe with patchy and nodular ground-glass and confluent airspace disease scattered throughout the right lower lobe and right middle lobe. Imaging features compatible with multifocal pneumonia. Follow-up CT chest may be warranted to ensure resolution. 2. Mild right hilar lymphadenopathy, likely reactive.       REVIEW OF SYSTEMS:   Constitutional: Denies fevers, chills  Eyes: Denies blurriness of vision Ears, nose, mouth, throat, and face: Denies mucositis or sore throat Cardiovascular: Denies palpitation, chest discomfort or lower extremity swelling Gastrointestinal:  Denies nausea, heartburn or change in bowel habits Skin: Denies abnormal skin rashes Lymphatics: Denies new lymphadenopathy or easy bruising Neurological:Denies numbness, tingling or new weaknesses Behavioral/Psych: Mood is stable, no new changes  All other systems were reviewed with the patient and are negative.  I have reviewed the past medical history, past surgical history, social history and family history with the patient and they are unchanged from previous note.  ALLERGIES:  has No Known Allergies.  MEDICATIONS:  Current Outpatient Medications  Medication Sig Dispense Refill  . benzonatate (TESSALON) 100 MG capsule Take 1 capsule (100 mg total) by mouth 3 (three) times daily as needed. 20 capsule 0  . doxycycline (VIBRA-TABS) 100 MG tablet Take 1 tablet (100 mg total) by mouth 2 (two) times daily. 14 tablet 0  . fluticasone (FLONASE) 50 MCG/ACT nasal spray PLACE 1-2 SPRAYS INTO BOTH NOSTRILS DAILY AS NEEDED FOR RHINITIS. 16 mL 1  . furosemide (LASIX) 20 MG tablet Take 1 tablet (20 mg total) by mouth daily. 30 tablet 9  . ibuprofen (ADVIL,MOTRIN) 200 MG tablet Take 400-800 mg by mouth every 6 (six) hours as needed for headache, mild pain or moderate pain.     . pantoprazole (PROTONIX) 20 MG tablet Take 1 tablet (20 mg total) by  mouth daily. 90 tablet 2  . Potassium Chloride ER 20 MEQ TBCR TAKE 1 TABLET BY MOUTH EVERY DAY 90 tablet 1  . predniSONE (DELTASONE) 20 MG tablet Take 1 tablet (20 mg total) by mouth daily with breakfast. 7 tablet 0   No current facility-administered medications for this visit.    PHYSICAL EXAMINATION: ECOG PERFORMANCE STATUS: 1 - Symptomatic but completely ambulatory  Vitals:   01/27/20 1315  BP: 132/70  Pulse: 88  Resp: 20  Temp: 98.3 F (36.8 C)  SpO2: 98%   Filed Weights   01/27/20 1315  Weight: 195 lb 14.4 oz (88.9 kg)    GENERAL:alert, no distress and comfortable NEURO: alert & oriented x 3 with fluent speech, no focal motor/sensory deficits  LABORATORY DATA:  I have reviewed the data as listed    Component Value Date/Time   NA 140 01/19/2020 0945   NA 138 12/08/2018 0931   NA 139 01/30/2015 0924   K 4.1 01/19/2020 0945   K 4.8 01/30/2015 0924   CL 103 01/19/2020 0945   CO2 28 01/19/2020 0945   CO2 25 01/30/2015 0924   GLUCOSE 92 01/19/2020 0945   GLUCOSE 101 01/30/2015 0924   BUN 11 01/19/2020 0945   BUN 12 12/08/2018 0931   BUN 11.5 01/30/2015 0924   CREATININE 0.82 01/19/2020  0945   CREATININE 0.8 01/30/2015 0924   CALCIUM 8.9 01/19/2020 0945   CALCIUM 9.1 01/30/2015 0924   PROT 6.2 (L) 01/19/2020 0945   PROT 5.8 (L) 12/08/2018 0931   PROT 7.0 01/30/2015 0924   ALBUMIN 3.0 (L) 01/19/2020 0945   ALBUMIN 4.1 12/08/2018 0931   ALBUMIN 4.2 01/30/2015 0924   AST 23 01/19/2020 0945   AST 21 01/30/2015 0924   ALT 24 01/19/2020 0945   ALT 27 01/30/2015 0924   ALKPHOS 91 01/19/2020 0945   ALKPHOS 77 01/30/2015 0924   BILITOT 0.9 01/19/2020 0945   BILITOT 0.6 12/08/2018 0931   BILITOT 0.48 01/30/2015 0924   GFRNONAA >60 01/19/2020 0945   GFRNONAA 76 02/14/2014 1105   GFRAA >60 01/19/2020 0945   GFRAA 88 02/14/2014 1105    No results found for: SPEP, UPEP  Lab Results  Component Value Date   WBC 5.7 01/19/2020   NEUTROABS 4.8 01/19/2020   HGB  10.1 (L) 01/19/2020   HCT 31.3 (L) 01/19/2020   MCV 88.7 01/19/2020   PLT 259 01/19/2020      Chemistry      Component Value Date/Time   NA 140 01/19/2020 0945   NA 138 12/08/2018 0931   NA 139 01/30/2015 0924   K 4.1 01/19/2020 0945   K 4.8 01/30/2015 0924   CL 103 01/19/2020 0945   CO2 28 01/19/2020 0945   CO2 25 01/30/2015 0924   BUN 11 01/19/2020 0945   BUN 12 12/08/2018 0931   BUN 11.5 01/30/2015 0924   CREATININE 0.82 01/19/2020 0945   CREATININE 0.8 01/30/2015 0924      Component Value Date/Time   CALCIUM 8.9 01/19/2020 0945   CALCIUM 9.1 01/30/2015 0924   ALKPHOS 91 01/19/2020 0945   ALKPHOS 77 01/30/2015 0924   AST 23 01/19/2020 0945   AST 21 01/30/2015 0924   ALT 24 01/19/2020 0945   ALT 27 01/30/2015 0924   BILITOT 0.9 01/19/2020 0945   BILITOT 0.6 12/08/2018 0931   BILITOT 0.48 01/30/2015 0924       RADIOGRAPHIC STUDIES: I have personally reviewed the radiological images as listed and agreed with the findings in the report. CT CHEST W CONTRAST  Result Date: 01/19/2020 CLINICAL DATA:  Pneumonia. History of lymphoma. EXAM: CT CHEST WITH CONTRAST TECHNIQUE: Multidetector CT imaging of the chest was performed during intravenous contrast administration. CONTRAST:  16m OMNIPAQUE IOHEXOL 300 MG/ML  SOLN COMPARISON:  05/26/2019 FINDINGS: Cardiovascular: The heart size is normal. No substantial pericardial effusion. No thoracic aortic aneurysm. Mediastinum/Nodes: Small mediastinal lymph nodes evident. 12 mm short axis right hilar node is new in the interval. No left hilar lymphadenopathy. The esophagus has normal imaging features. There is no axillary lymphadenopathy. Lungs/Pleura: Focal consolidative opacity is identified in the superior segment right lower lobe with patchy and nodular ground-glass and confluent airspace disease scattered throughout the right lower lobe. Nodular areas of confluent consolidative opacity are seen in the right middle lobe. Left lung clear.  No pleural effusion. Upper Abdomen: Unremarkable. Musculoskeletal: No worrisome lytic or sclerotic osseous abnormality. IMPRESSION: 1. Focal consolidative opacity in the superior segment right lower lobe with patchy and nodular ground-glass and confluent airspace disease scattered throughout the right lower lobe and right middle lobe. Imaging features compatible with multifocal pneumonia. Follow-up CT chest may be warranted to ensure resolution. 2. Mild right hilar lymphadenopathy, likely reactive. Electronically Signed   By: EMisty StanleyM.D.   On: 01/19/2020 16:58

## 2020-01-27 NOTE — Assessment & Plan Note (Signed)
Overall, his symptoms has improved She will finish her course of doxycycline and prednisone I recommend her to rest as much as possible I will see her again in 2 weeks for further follow-up

## 2020-01-27 NOTE — Assessment & Plan Note (Addendum)
From the lymphoma standpoint, there is no evidence of disease The small lymph node seen on CT imaging is not due to disease, likely reactive Observe for now We discussed the risk and benefits of discontinuation of rituximab She has been disease-free for a long time and currently, I felt that the risk of rituximab treatment outweighs the benefit and both the patient and her husband agreed We will discontinue rituximab I will see her again in 2 weeks for further follow-up and supportive care

## 2020-02-10 ENCOUNTER — Inpatient Hospital Stay (HOSPITAL_BASED_OUTPATIENT_CLINIC_OR_DEPARTMENT_OTHER): Payer: Commercial Managed Care - PPO | Admitting: Hematology and Oncology

## 2020-02-10 ENCOUNTER — Ambulatory Visit: Payer: Commercial Managed Care - PPO

## 2020-02-10 ENCOUNTER — Other Ambulatory Visit: Payer: Self-pay

## 2020-02-10 ENCOUNTER — Encounter: Payer: Self-pay | Admitting: Hematology and Oncology

## 2020-02-10 ENCOUNTER — Inpatient Hospital Stay: Payer: Commercial Managed Care - PPO

## 2020-02-10 VITALS — BP 135/81 | HR 77 | Temp 98.9°F | Resp 18 | Ht 66.0 in | Wt 199.0 lb

## 2020-02-10 DIAGNOSIS — D472 Monoclonal gammopathy: Secondary | ICD-10-CM

## 2020-02-10 DIAGNOSIS — R0789 Other chest pain: Secondary | ICD-10-CM

## 2020-02-10 DIAGNOSIS — E559 Vitamin D deficiency, unspecified: Secondary | ICD-10-CM

## 2020-02-10 DIAGNOSIS — C8307 Small cell B-cell lymphoma, spleen: Secondary | ICD-10-CM

## 2020-02-10 DIAGNOSIS — J189 Pneumonia, unspecified organism: Secondary | ICD-10-CM | POA: Diagnosis not present

## 2020-02-10 LAB — COMPREHENSIVE METABOLIC PANEL
ALT: 22 U/L (ref 0–44)
AST: 15 U/L (ref 15–41)
Albumin: 3.3 g/dL — ABNORMAL LOW (ref 3.5–5.0)
Alkaline Phosphatase: 75 U/L (ref 38–126)
Anion gap: 8 (ref 5–15)
BUN: 12 mg/dL (ref 6–20)
CO2: 29 mmol/L (ref 22–32)
Calcium: 8.8 mg/dL — ABNORMAL LOW (ref 8.9–10.3)
Chloride: 102 mmol/L (ref 98–111)
Creatinine, Ser: 0.82 mg/dL (ref 0.44–1.00)
GFR calc Af Amer: >60 mL/min (ref >60)
GFR calc non Af Amer: >60 mL/min (ref >60)
Glucose, Bld: 98 mg/dL (ref 70–99)
Potassium: 3.8 mmol/L (ref 3.5–5.1)
Sodium: 139 mmol/L (ref 135–145)
Total Bilirubin: 0.7 mg/dL (ref 0.3–1.2)
Total Protein: 6 g/dL — ABNORMAL LOW (ref 6.5–8.1)

## 2020-02-10 LAB — VITAMIN D 25 HYDROXY (VIT D DEFICIENCY, FRACTURES): Vit D, 25-Hydroxy: 28.15 ng/mL — ABNORMAL LOW (ref 30–100)

## 2020-02-10 LAB — CBC WITH DIFFERENTIAL/PLATELET
Abs Immature Granulocytes: 0.04 10*3/uL (ref 0.00–0.07)
Basophils Absolute: 0 10*3/uL (ref 0.0–0.1)
Basophils Relative: 0 %
Eosinophils Absolute: 0.1 10*3/uL (ref 0.0–0.5)
Eosinophils Relative: 1 %
HCT: 31.6 % — ABNORMAL LOW (ref 36.0–46.0)
Hemoglobin: 10 g/dL — ABNORMAL LOW (ref 12.0–15.0)
Immature Granulocytes: 1 %
Lymphocytes Relative: 3 %
Lymphs Abs: 0.2 10*3/uL — ABNORMAL LOW (ref 0.7–4.0)
MCH: 27.2 pg (ref 26.0–34.0)
MCHC: 31.6 g/dL (ref 30.0–36.0)
MCV: 86.1 fL (ref 80.0–100.0)
Monocytes Absolute: 0.4 10*3/uL (ref 0.1–1.0)
Monocytes Relative: 6 %
Neutro Abs: 5.8 10*3/uL (ref 1.7–7.7)
Neutrophils Relative %: 89 %
Platelets: 182 10*3/uL (ref 150–400)
RBC: 3.67 MIL/uL — ABNORMAL LOW (ref 3.87–5.11)
RDW: 15.3 % (ref 11.5–15.5)
WBC: 6.5 10*3/uL (ref 4.0–10.5)
nRBC: 0 % (ref 0.0–0.2)

## 2020-02-10 MED ORDER — HYDROCOD POLST-CPM POLST ER 10-8 MG/5ML PO SUER: 5.0000 mL | Freq: Three times a day (TID) | ORAL | 0 refills | Status: DC

## 2020-02-10 NOTE — Assessment & Plan Note (Signed)
Her exam is benign It could be due to muscular strain Observe for now I reviewed CT imaging with the patient which show no evidence of abnormalities in that location

## 2020-02-10 NOTE — Assessment & Plan Note (Signed)
From the lymphoma standpoint, there is no evidence of disease The small lymph node seen on CT imaging is not due to disease, likely reactive Observe for now I plan to repeat CT imaging in July for further assessment Her rituximab was discontinued

## 2020-02-10 NOTE — Assessment & Plan Note (Signed)
Her exam is benign She has no further fevers in the last few days I recommend prednisone taper I refill her prescription of cough syrup I plan to repeat CT imaging study in early July for further assessment

## 2020-02-10 NOTE — Progress Notes (Signed)
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[place]) From the lymphoma standpoint, there is no evidence of disease The small lymph node seen on CT imaging is not due to disease, likely reactive Observe for now I plan to repeat CT imaging in July for further assessment Her rituximab was discontinued  Chest wall discomfort Her exam is benign It could be due to muscular strain Observe for now I reviewed CT imaging with the patient which show no evidence of abnormalities in that location  Right lower lobe pneumonia Her exam is benign She has no further fevers in the last few days I recommend prednisone taper I refill her prescription of cough syrup I plan to repeat CT imaging study in early July for further assessment   Orders Placed This Encounter  Procedures  . CT CHEST WO CONTRAST    Standing Status:   Future    Standing Expiration Date:   02/09/2021    Order Specific Question:   Preferred imaging location?    Answer:   [facility]    Order Specific Question:   Radiology Contrast Protocol - do NOT remove file path    Answer:   [email or web address]    Order Specific Question:   ** REASON FOR EXAM (FREE TEXT)    Answer:   pneumonia, ensure resolution    Order Specific Question:   Is patient pregnant?    Answer:   No    All questions were answered. The patient knows to call the clinic with any problems, questions or concerns. The total time spent in the appointment was 20 minutes encounter with patients including review of chart and various tests results, discussions about plan of care and coordination of care plan   Heath Lark, MD 02/10/2020 11:56 AM  INTERVAL HISTORY: Please see below for problem oriented charting. 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 8 mm in short axis right level I a lymph node with Deauville 4 activity, increased from prior. 2. Small but mildly hypermetabolic left axillary lymph node with Deauville 4 activity. 3. Splenic activity only minimally greater than that of the liver, but with a moderate amount of splenomegaly (splenic volume 680 cubic cm). No focal splenic lesion observed. 4. Diffuse mildly increased activity in the skeleton without corresponding CT abnormality. Possibilities might include low-level marrow infiltration or granulocyte stimulation.   07/02/2018 - 11/26/2018 Chemotherapy   The patient had rituximab for chemotherapy treatment.     07/02/2018 - 11/26/2018 Chemotherapy   The patient had rituximab for chemotherapy treatment.     12/30/2018 Cancer Staging   Staging form: Hodgkin and Non-Hodgkin Lymphoma, AJCC 7th Edition - Clinical: S -  Spleen, B - Symptoms - Signed by Heath Lark, MD on 12/30/2018   05/26/2019 Imaging   1. No findings in the chest, abdomen or pelvis to suggest recurrent disease. 2. No acute findings in the chest, abdomen or pelvis. 3. Mild atherosclerosis in the pelvic vasculature. 4. Additional incidental findings, as above.   01/19/2020 Imaging   1. Focal consolidative opacity in the superior segment right lower lobe  with patchy and nodular ground-glass and confluent airspace disease scattered throughout the right lower lobe and right middle lobe. Imaging features compatible with multifocal pneumonia. Follow-up CT chest may be warranted to ensure resolution. 2. Mild right hilar lymphadenopathy, likely reactive.       REVIEW OF SYSTEMS:   Eyes: Denies blurriness of vision Ears, nose, mouth, throat, and face: Denies mucositis or sore throat Gastrointestinal:  Denies nausea, heartburn or change in bowel habits Skin: Denies abnormal skin rashes Lymphatics: Denies new lymphadenopathy or easy bruising Neurological:Denies numbness, tingling or new weaknesses Behavioral/Psych: Mood is stable, no new changes  All other systems were reviewed with the patient and are negative.  I have reviewed the past medical history, past surgical history, social history and family history with the patient and they are unchanged from previous note.  ALLERGIES:  has No Known Allergies.  MEDICATIONS:  Current Outpatient Medications  Medication Sig Dispense Refill  . chlorpheniramine-HYDROcodone (TUSSIONEX) 10-8 MG/5ML SUER Take 5 mLs by mouth 3 (three) times daily. 140 mL 0  . fluticasone (FLONASE) 50 MCG/ACT nasal spray PLACE 1-2 SPRAYS INTO BOTH NOSTRILS DAILY AS NEEDED FOR RHINITIS. 16 mL 1  . furosemide (LASIX) 20 MG tablet Take 1 tablet (20 mg total) by mouth daily. 30 tablet 9  . ibuprofen (ADVIL,MOTRIN) 200 MG tablet Take 400-800 mg by mouth every 6 (six) hours as needed for headache, mild pain or moderate pain.     . pantoprazole (PROTONIX) 20 MG tablet Take 1 tablet (20 mg total) by mouth daily. 90 tablet 2  . Potassium Chloride ER 20 MEQ TBCR TAKE 1 TABLET BY MOUTH EVERY DAY 90 tablet 1   No current facility-administered medications for this visit.    PHYSICAL EXAMINATION: ECOG PERFORMANCE STATUS: 1 - Symptomatic but completely ambulatory  Vitals:   02/10/20 1120  BP: 135/81  Pulse: 77  Resp: 18  Temp:  98.9 F (37.2 C)  SpO2: 99%   Filed Weights   02/10/20 1120  Weight: 199 lb (90.3 kg)    GENERAL:alert, no distress and comfortable SKIN: skin color, texture, turgor are normal, no rashes or significant lesions EYES: normal, Conjunctiva are pink and non-injected, sclera clear OROPHARYNX:no exudate, no erythema and lips, buccal mucosa, and tongue normal  NECK: supple, thyroid normal size, non-tender, without nodularity LYMPH:  no palpable lymphadenopathy in the cervical, axillary or inguinal LUNGS: clear to auscultation and percussion with normal breathing effort HEART: regular rate & rhythm and no murmurs and no lower extremity edema ABDOMEN:abdomen soft, non-tender and normal bowel sounds Musculoskeletal:no cyanosis of digits and no clubbing  NEURO: alert & oriented x 3 with fluent speech, no focal motor/sensory deficits  LABORATORY DATA:  I have reviewed the data as listed    Component Value Date/Time   NA 139 02/10/2020 1104   NA 138 12/08/2018 0931   NA 139 01/30/2015 0924   K 3.8 02/10/2020 1104   K 4.8 01/30/2015 0924   CL 102 02/10/2020 1104   CO2 29 02/10/2020 1104   CO2 25 01/30/2015 0924   GLUCOSE 98 02/10/2020 1104  GLUCOSE 101 01/30/2015 0924   BUN 12 02/10/2020 1104   BUN 12 12/08/2018 0931   BUN 11.5 01/30/2015 0924   CREATININE 0.82 02/10/2020 1104   CREATININE 0.8 01/30/2015 0924   CALCIUM 8.8 (L) 02/10/2020 1104   CALCIUM 9.1 01/30/2015 0924   PROT 6.0 (L) 02/10/2020 1104   PROT 5.8 (L) 12/08/2018 0931   PROT 7.0 01/30/2015 0924   ALBUMIN 3.3 (L) 02/10/2020 1104   ALBUMIN 4.1 12/08/2018 0931   ALBUMIN 4.2 01/30/2015 0924   AST 15 02/10/2020 1104   AST 21 01/30/2015 0924   ALT 22 02/10/2020 1104   ALT 27 01/30/2015 0924   ALKPHOS 75 02/10/2020 1104   ALKPHOS 77 01/30/2015 0924   BILITOT 0.7 02/10/2020 1104   BILITOT 0.6 12/08/2018 0931   BILITOT 0.48 01/30/2015 0924   GFRNONAA >60 02/10/2020 1104   GFRNONAA 76 02/14/2014 1105   GFRAA >60  02/10/2020 1104   GFRAA 88 02/14/2014 1105    No results found for: SPEP, UPEP  Lab Results  Component Value Date   WBC 6.5 02/10/2020   NEUTROABS 5.8 02/10/2020   HGB 10.0 (L) 02/10/2020   HCT 31.6 (L) 02/10/2020   MCV 86.1 02/10/2020   PLT 182 02/10/2020      Chemistry      Component Value Date/Time   NA 139 02/10/2020 1104   NA 138 12/08/2018 0931   NA 139 01/30/2015 0924   K 3.8 02/10/2020 1104   K 4.8 01/30/2015 0924   CL 102 02/10/2020 1104   CO2 29 02/10/2020 1104   CO2 25 01/30/2015 0924   BUN 12 02/10/2020 1104   BUN 12 12/08/2018 0931   BUN 11.5 01/30/2015 0924   CREATININE 0.82 02/10/2020 1104   CREATININE 0.8 01/30/2015 0924      Component Value Date/Time   CALCIUM 8.8 (L) 02/10/2020 1104   CALCIUM 9.1 01/30/2015 0924   ALKPHOS 75 02/10/2020 1104   ALKPHOS 77 01/30/2015 0924   AST 15 02/10/2020 1104   AST 21 01/30/2015 0924   ALT 22 02/10/2020 1104   ALT 27 01/30/2015 0924   BILITOT 0.7 02/10/2020 1104   BILITOT 0.6 12/08/2018 0931   BILITOT 0.48 01/30/2015 0924       RADIOGRAPHIC STUDIES: I have personally reviewed the radiological images as listed and agreed with the findings in the report. CT CHEST W CONTRAST  Result Date: 01/19/2020 CLINICAL DATA:  Pneumonia. History of lymphoma. EXAM: CT CHEST WITH CONTRAST TECHNIQUE: Multidetector CT imaging of the chest was performed during intravenous contrast administration. CONTRAST:  67m OMNIPAQUE IOHEXOL 300 MG/ML  SOLN COMPARISON:  05/26/2019 FINDINGS: Cardiovascular: The heart size is normal. No substantial pericardial effusion. No thoracic aortic aneurysm. Mediastinum/Nodes: Small mediastinal lymph nodes evident. 12 mm short axis right hilar node is new in the interval. No left hilar lymphadenopathy. The esophagus has normal imaging features. There is no axillary lymphadenopathy. Lungs/Pleura: Focal consolidative opacity is identified in the superior segment right lower lobe with patchy and nodular  ground-glass and confluent airspace disease scattered throughout the right lower lobe. Nodular areas of confluent consolidative opacity are seen in the right middle lobe. Left lung clear. No pleural effusion. Upper Abdomen: Unremarkable. Musculoskeletal: No worrisome lytic or sclerotic osseous abnormality. IMPRESSION: 1. Focal consolidative opacity in the superior segment right lower lobe with patchy and nodular ground-glass and confluent airspace disease scattered throughout the right lower lobe and right middle lobe. Imaging features compatible with multifocal pneumonia. Follow-up CT chest may be warranted  to ensure resolution. 2. Mild right hilar lymphadenopathy, likely reactive. Electronically Signed   By: Misty Stanley M.D.   On: 01/19/2020 16:58

## 2020-02-11 LAB — IGM: IgM (Immunoglobulin M), Srm: 10 mg/dL — ABNORMAL LOW (ref 26–217)

## 2020-02-13 ENCOUNTER — Telehealth: Payer: Self-pay

## 2020-02-13 NOTE — Telephone Encounter (Signed)
-----   Message from Heath Lark, MD sent at 02/10/2020  6:14 PM EDT ----- Regarding: vitamin D Her level is borderline low I recommend 2000 units daily

## 2020-02-13 NOTE — Telephone Encounter (Signed)
Called and given below message. She verbalized understanding. 

## 2020-03-08 ENCOUNTER — Inpatient Hospital Stay: Payer: Commercial Managed Care - PPO | Attending: Hematology and Oncology | Admitting: Hematology and Oncology

## 2020-03-08 ENCOUNTER — Telehealth: Payer: Self-pay

## 2020-03-08 ENCOUNTER — Encounter: Payer: Self-pay | Admitting: Hematology and Oncology

## 2020-03-08 ENCOUNTER — Other Ambulatory Visit: Payer: Self-pay

## 2020-03-08 VITALS — BP 140/90 | HR 105 | Temp 98.2°F | Resp 18 | Ht 66.0 in | Wt 189.4 lb

## 2020-03-08 DIAGNOSIS — J302 Other seasonal allergic rhinitis: Secondary | ICD-10-CM

## 2020-03-08 DIAGNOSIS — Z791 Long term (current) use of non-steroidal anti-inflammatories (NSAID): Secondary | ICD-10-CM | POA: Insufficient documentation

## 2020-03-08 DIAGNOSIS — C8307 Small cell B-cell lymphoma, spleen: Secondary | ICD-10-CM | POA: Diagnosis present

## 2020-03-08 DIAGNOSIS — Z9221 Personal history of antineoplastic chemotherapy: Secondary | ICD-10-CM | POA: Insufficient documentation

## 2020-03-08 DIAGNOSIS — J189 Pneumonia, unspecified organism: Secondary | ICD-10-CM | POA: Diagnosis not present

## 2020-03-08 DIAGNOSIS — R1110 Vomiting, unspecified: Secondary | ICD-10-CM | POA: Insufficient documentation

## 2020-03-08 DIAGNOSIS — Z8701 Personal history of pneumonia (recurrent): Secondary | ICD-10-CM | POA: Diagnosis not present

## 2020-03-08 DIAGNOSIS — J309 Allergic rhinitis, unspecified: Secondary | ICD-10-CM | POA: Insufficient documentation

## 2020-03-08 DIAGNOSIS — R509 Fever, unspecified: Secondary | ICD-10-CM | POA: Diagnosis not present

## 2020-03-08 DIAGNOSIS — R05 Cough: Secondary | ICD-10-CM | POA: Diagnosis not present

## 2020-03-08 DIAGNOSIS — Z79899 Other long term (current) drug therapy: Secondary | ICD-10-CM | POA: Diagnosis not present

## 2020-03-08 DIAGNOSIS — R059 Cough, unspecified: Secondary | ICD-10-CM

## 2020-03-08 MED ORDER — DOXYCYCLINE HYCLATE 100 MG PO TABS
100.0000 mg | ORAL_TABLET | Freq: Two times a day (BID) | ORAL | 0 refills | Status: DC
Start: 2020-03-08 — End: 2020-04-26

## 2020-03-08 NOTE — Progress Notes (Signed)
[facility] PROGRESS NOTE  Patient Care Team: Sofie Hartigan, MD as PCP - General (Family Medicine) Heath Lark, MD as Consulting Physician (Hematology and Oncology)  ASSESSMENT & PLAN:  Right lower lobe pneumonia She has severe pneumonia diagnosed 2 months ago and now have recurrence of fever and productive cough I am concerned about recurrent infection I recommend another course of oral antibiotics with doxycycline along with cough syrup as needed I will call her next week to see how she is doing  Cough in adult She will continue cough syrup I do not believe she needs prednisone today  Allergic rhinitis Her cough could also be triggered by a component of allergic rhinitis with nasal drainage I recommend she takes over-the-counter antihistamines   No orders of the defined types were placed in this encounter.   All questions were answered. The patient knows to call the clinic with any problems, questions or concerns. The total time spent in the appointment was 20 minutes encounter with patients including review of chart and various tests results, discussions about plan of care and coordination of care plan   Heath Lark, MD 03/08/2020 2:11 PM  INTERVAL HISTORY: Please see below for problem oriented charting. 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 8 mm in short axis right level I a lymph node with Deauville 4 activity, increased from prior. 2. Small but mildly hypermetabolic left axillary lymph node with Deauville 4 activity. 3. Splenic activity only minimally greater than that of the liver, but with a moderate amount of splenomegaly (splenic volume 680 cubic cm). No focal splenic lesion observed. 4. Diffuse mildly increased activity in the skeleton without corresponding CT abnormality. Possibilities might include low-level marrow infiltration or granulocyte stimulation.   07/02/2018 - 11/26/2018 Chemotherapy   The patient had rituximab for chemotherapy treatment.     07/02/2018 - 11/26/2018 Chemotherapy   The patient had rituximab for chemotherapy treatment.     12/30/2018 Cancer Staging   Staging form: Hodgkin and Non-Hodgkin Lymphoma, AJCC 7th Edition - Clinical: S - Spleen, B - Symptoms - Signed by Heath Lark, MD on 12/30/2018   05/26/2019 Imaging   1. No findings in the chest, abdomen or pelvis to suggest recurrent disease. 2. No acute findings in the chest, abdomen or pelvis. 3. Mild atherosclerosis in the pelvic vasculature. 4. Additional incidental findings, as above.   01/19/2020 Imaging   1. Focal consolidative opacity in the superior segment right lower lobe with patchy and nodular ground-glass and confluent airspace disease scattered throughout the right lower lobe and right middle lobe. Imaging features compatible with multifocal pneumonia. Follow-up CT chest may be warranted to ensure resolution. 2. Mild right hilar lymphadenopathy, likely reactive.       REVIEW  OF SYSTEMS:   Eyes: Denies blurriness of vision Ears, nose, mouth, throat, and face: Denies mucositis or sore throat Cardiovascular: Denies palpitation, chest discomfort or lower extremity swelling Gastrointestinal:  Denies  nausea, heartburn or change in bowel habits Skin: Denies abnormal skin rashes Lymphatics: Denies new lymphadenopathy or easy bruising Neurological:Denies numbness, tingling or new weaknesses Behavioral/Psych: Mood is stable, no new changes  All other systems were reviewed with the patient and are negative.  I have reviewed the past medical history, past surgical history, social history and family history with the patient and they are unchanged from previous note.  ALLERGIES:  has No Known Allergies.  MEDICATIONS:  Current Outpatient Medications  Medication Sig Dispense Refill  . chlorpheniramine-HYDROcodone (TUSSIONEX) 10-8 MG/5ML SUER Take 5 mLs by mouth 3 (three) times daily. 140 mL 0  . doxycycline (VIBRA-TABS) 100 MG tablet Take 1 tablet (100 mg total) by mouth 2 (two) times daily. 10 tablet 0  . fluticasone (FLONASE) 50 MCG/ACT nasal spray PLACE 1-2 SPRAYS INTO BOTH NOSTRILS DAILY AS NEEDED FOR RHINITIS. 16 mL 1  . furosemide (LASIX) 20 MG tablet Take 1 tablet (20 mg total) by mouth daily. 30 tablet 9  . ibuprofen (ADVIL,MOTRIN) 200 MG tablet Take 400-800 mg by mouth every 6 (six) hours as needed for headache, mild pain or moderate pain.     . pantoprazole (PROTONIX) 20 MG tablet Take 1 tablet (20 mg total) by mouth daily. 90 tablet 2  . Potassium Chloride ER 20 MEQ TBCR TAKE 1 TABLET BY MOUTH EVERY DAY 90 tablet 1   No current facility-administered medications for this visit.    PHYSICAL EXAMINATION: ECOG PERFORMANCE STATUS: 1 - Symptomatic but completely ambulatory  Vitals:   03/08/20 1353  BP: 140/90  Pulse: (!) 105  Resp: 18  Temp: 98.2 F (36.8 C)  SpO2: 99%   Filed Weights   03/08/20 1353  Weight: 189 lb 6.4 oz (85.9 kg)    GENERAL:alert, no distress and comfortable SKIN: skin color, texture, turgor are normal, no rashes or significant lesions EYES: normal, Conjunctiva are pink and non-injected, sclera clear OROPHARYNX:no exudate, no erythema and lips, buccal  mucosa, and tongue normal  NECK: supple, thyroid normal size, non-tender, without nodularity LYMPH:  no palpable lymphadenopathy in the cervical, axillary or inguinal LUNGS: clear to auscultation and percussion with normal breathing effort HEART: regular rate & rhythm and no murmurs and no lower extremity edema ABDOMEN:abdomen soft, non-tender and normal bowel sounds Musculoskeletal:no cyanosis of digits and no clubbing  NEURO: alert & oriented x 3 with fluent speech, no focal motor/sensory deficits  LABORATORY DATA:  I have reviewed the data as listed    Component Value Date/Time   NA 139 02/10/2020 1104   NA 138 12/08/2018 0931   NA 139 01/30/2015 0924   K 3.8 02/10/2020 1104   K 4.8 01/30/2015 0924   CL 102 02/10/2020 1104   CO2 29 02/10/2020 1104   CO2 25 01/30/2015 0924   GLUCOSE 98 02/10/2020 1104   GLUCOSE 101 01/30/2015 0924   BUN 12 02/10/2020 1104   BUN 12 12/08/2018 0931   BUN 11.5 01/30/2015 0924   CREATININE 0.82 02/10/2020 1104   CREATININE 0.8 01/30/2015 0924   CALCIUM 8.8 (L) 02/10/2020 1104   CALCIUM 9.1 01/30/2015 0924   PROT 6.0 (L) 02/10/2020 1104   PROT 5.8 (L) 12/08/2018 0931   PROT 7.0 01/30/2015 0924   ALBUMIN 3.3 (L) 02/10/2020 1104   ALBUMIN 4.1 12/08/2018 0931   ALBUMIN  4.2 01/30/2015 0924   AST 15 02/10/2020 1104   AST 21 01/30/2015 0924   ALT 22 02/10/2020 1104   ALT 27 01/30/2015 0924   ALKPHOS 75 02/10/2020 1104   ALKPHOS 77 01/30/2015 0924   BILITOT 0.7 02/10/2020 1104   BILITOT 0.6 12/08/2018 0931   BILITOT 0.48 01/30/2015 0924   GFRNONAA >60 02/10/2020 1104   GFRNONAA 76 02/14/2014 1105   GFRAA >60 02/10/2020 1104   GFRAA 88 02/14/2014 1105    No results found for: SPEP, UPEP  Lab Results  Component Value Date   WBC 6.5 02/10/2020   NEUTROABS 5.8 02/10/2020   HGB 10.0 (L) 02/10/2020   HCT 31.6 (L) 02/10/2020   MCV 86.1 02/10/2020   PLT 182 02/10/2020      Chemistry      Component Value Date/Time   NA 139 02/10/2020  1104   NA 138 12/08/2018 0931   NA 139 01/30/2015 0924   K 3.8 02/10/2020 1104   K 4.8 01/30/2015 0924   CL 102 02/10/2020 1104   CO2 29 02/10/2020 1104   CO2 25 01/30/2015 0924   BUN 12 02/10/2020 1104   BUN 12 12/08/2018 0931   BUN 11.5 01/30/2015 0924   CREATININE 0.82 02/10/2020 1104   CREATININE 0.8 01/30/2015 0924      Component Value Date/Time   CALCIUM 8.8 (L) 02/10/2020 1104   CALCIUM 9.1 01/30/2015 0924   ALKPHOS 75 02/10/2020 1104   ALKPHOS 77 01/30/2015 0924   AST 15 02/10/2020 1104   AST 21 01/30/2015 0924   ALT 22 02/10/2020 1104   ALT 27 01/30/2015 0924   BILITOT 0.7 02/10/2020 1104   BILITOT 0.6 12/08/2018 0931   BILITOT 0.48 01/30/2015 7614

## 2020-03-08 NOTE — Assessment & Plan Note (Signed)
Her cough could also be triggered by a component of allergic rhinitis with nasal drainage I recommend she takes over-the-counter antihistamines

## 2020-03-08 NOTE — Assessment & Plan Note (Signed)
She will continue cough syrup I do not believe she needs prednisone today

## 2020-03-08 NOTE — Telephone Encounter (Signed)
-----   Message from Heath Lark, MD sent at 03/08/2020  9:22 AM EDT ----- Regarding: RE: appt I can see her at 92 or see Lucianne Lei at 130 ----- Message ----- From: Flo Shanks, RN Sent: 03/08/2020   8:52 AM EDT To: Heath Lark, MD Subject: appt                                           She called and left a message. She would like the 1:30 appt today if available. Want me to offer appt with Lucianne Lei?

## 2020-03-08 NOTE — Assessment & Plan Note (Signed)
She has severe pneumonia diagnosed 2 months ago and now have recurrence of fever and productive cough I am concerned about recurrent infection I recommend another course of oral antibiotics with doxycycline along with cough syrup as needed I will call her next week to see how she is doing

## 2020-03-08 NOTE — Telephone Encounter (Signed)
Called and schedule for 1200 today with Dr. Alvy Bimler. She verbalized understanding to the time.

## 2020-03-13 ENCOUNTER — Telehealth: Payer: Self-pay | Admitting: *Deleted

## 2020-03-13 NOTE — Telephone Encounter (Signed)
-----   Message from Heath Lark, MD sent at 03/13/2020 10:07 AM EDT ----- Regarding: RE: can you call and ask if her cough/fever is better? I only wrote for 5 days to reassess how she is If fever is gone, she does not need more than 5 days She can continue cough syrup for cough or we can add prednisone 20 mg daily for 7 days ----- Message ----- From: Merril Abbe, LPN Sent: [id number]   9:58 AM EDT To: Heath Lark, MD Subject: FW: can you call and ask if her cough/fever #  Fever is gone but still coughing. Pt wants to know if ABT was suppose to be for 5, 7 or 10 days. Pharmacy gave supply for 5 days. Looked in chart, didn't see how many days she was suppose to be on ABT. She said she thought she heard 10 days. Just want to clarify  ----- Message ----- From: Heath Lark, MD Sent: 03/13/2020   9:02 AM EDT To: [facility] Subject: can you call and ask if her cough/fever is b#

## 2020-03-13 NOTE — Telephone Encounter (Signed)
Per Dr.Gorsuch, called pt to f/u on symptoms for fever and cough. Pt stated, fever was gone but cough is still lingering. Clarified with Dr.Gorsuch on ABT days and suggestion for cough which recommendations were to continue cough syrup and could also add in prednisone 20 mg x 7 days. No longer needs ABT due to fever no longer being present. Pt declined prednisone due to recently taking it. Advised if there were any other issues to call office.

## 2020-04-19 ENCOUNTER — Ambulatory Visit (HOSPITAL_COMMUNITY): Payer: Commercial Managed Care - PPO

## 2020-04-19 ENCOUNTER — Other Ambulatory Visit: Payer: Self-pay

## 2020-04-19 ENCOUNTER — Ambulatory Visit (HOSPITAL_COMMUNITY)
Admission: RE | Admit: 2020-04-19 | Discharge: 2020-04-19 | Disposition: A | Discharge status: Home / Self Care | Payer: Commercial Managed Care - PPO | Source: Ambulatory Visit | Attending: Hematology and Oncology | Admitting: Hematology and Oncology

## 2020-04-19 ENCOUNTER — Inpatient Hospital Stay: Payer: Commercial Managed Care - PPO | Attending: Hematology and Oncology

## 2020-04-19 ENCOUNTER — Inpatient Hospital Stay: Payer: Commercial Managed Care - PPO

## 2020-04-19 DIAGNOSIS — Z9221 Personal history of antineoplastic chemotherapy: Secondary | ICD-10-CM | POA: Insufficient documentation

## 2020-04-19 DIAGNOSIS — R05 Cough: Secondary | ICD-10-CM | POA: Diagnosis not present

## 2020-04-19 DIAGNOSIS — Z79899 Other long term (current) drug therapy: Secondary | ICD-10-CM | POA: Insufficient documentation

## 2020-04-19 DIAGNOSIS — D472 Monoclonal gammopathy: Secondary | ICD-10-CM

## 2020-04-19 DIAGNOSIS — C8307 Small cell B-cell lymphoma, spleen: Secondary | ICD-10-CM

## 2020-04-19 DIAGNOSIS — Z791 Long term (current) use of non-steroidal anti-inflammatories (NSAID): Secondary | ICD-10-CM | POA: Diagnosis not present

## 2020-04-19 DIAGNOSIS — J189 Pneumonia, unspecified organism: Secondary | ICD-10-CM | POA: Insufficient documentation

## 2020-04-19 DIAGNOSIS — R918 Other nonspecific abnormal finding of lung field: Secondary | ICD-10-CM | POA: Diagnosis not present

## 2020-04-19 DIAGNOSIS — D801 Nonfamilial hypogammaglobulinemia: Secondary | ICD-10-CM | POA: Diagnosis not present

## 2020-04-19 LAB — CBC WITH DIFFERENTIAL/PLATELET
Abs Immature Granulocytes: 0.02 10*3/uL (ref 0.00–0.07)
Basophils Absolute: 0 10*3/uL (ref 0.0–0.1)
Basophils Relative: 0 %
Eosinophils Absolute: 0.2 10*3/uL (ref 0.0–0.5)
Eosinophils Relative: 4 %
HCT: 35.8 % — ABNORMAL LOW (ref 36.0–46.0)
Hemoglobin: 11.8 g/dL — ABNORMAL LOW (ref 12.0–15.0)
Immature Granulocytes: 0 %
Lymphocytes Relative: 9 %
Lymphs Abs: 0.5 10*3/uL — ABNORMAL LOW (ref 0.7–4.0)
MCH: 28.2 pg (ref 26.0–34.0)
MCHC: 33 g/dL (ref 30.0–36.0)
MCV: 85.6 fL (ref 80.0–100.0)
Monocytes Absolute: 0.4 10*3/uL (ref 0.1–1.0)
Monocytes Relative: 7 %
Neutro Abs: 5.1 10*3/uL (ref 1.7–7.7)
Neutrophils Relative %: 80 %
Platelets: 216 10*3/uL (ref 150–400)
RBC: 4.18 MIL/uL (ref 3.87–5.11)
RDW: 14.7 % (ref 11.5–15.5)
WBC: 6.3 10*3/uL (ref 4.0–10.5)
nRBC: 0 % (ref 0.0–0.2)

## 2020-04-19 LAB — COMPREHENSIVE METABOLIC PANEL
ALT: 23 U/L (ref 0–44)
AST: 17 U/L (ref 15–41)
Albumin: 3.8 g/dL (ref 3.5–5.0)
Alkaline Phosphatase: 97 U/L (ref 38–126)
Anion gap: 13 (ref 5–15)
BUN: 9 mg/dL (ref 6–20)
CO2: 25 mmol/L (ref 22–32)
Calcium: 9.1 mg/dL (ref 8.9–10.3)
Chloride: 103 mmol/L (ref 98–111)
Creatinine, Ser: 0.85 mg/dL (ref 0.44–1.00)
GFR calc Af Amer: >60 mL/min (ref >60)
GFR calc non Af Amer: >60 mL/min (ref >60)
Glucose, Bld: 83 mg/dL (ref 70–99)
Potassium: 4.1 mmol/L (ref 3.5–5.1)
Sodium: 141 mmol/L (ref 135–145)
Total Bilirubin: 0.7 mg/dL (ref 0.3–1.2)
Total Protein: 6.7 g/dL (ref 6.5–8.1)

## 2020-04-20 ENCOUNTER — Other Ambulatory Visit: Payer: Self-pay | Admitting: Hematology and Oncology

## 2020-04-20 ENCOUNTER — Telehealth: Payer: Self-pay

## 2020-04-20 ENCOUNTER — Inpatient Hospital Stay: Payer: Commercial Managed Care - PPO | Admitting: Hematology and Oncology

## 2020-04-20 DIAGNOSIS — R918 Other nonspecific abnormal finding of lung field: Secondary | ICD-10-CM

## 2020-04-20 LAB — IGM: IgM (Immunoglobulin M), Srm: 12 mg/dL — ABNORMAL LOW (ref 26–217)

## 2020-04-20 NOTE — Telephone Encounter (Signed)
OK I sent referral

## 2020-04-20 NOTE — Telephone Encounter (Signed)
Called and given below message. She verbalized understanding. She will keep appt as scheduled. She is agreeable to pulmonary consult.

## 2020-04-20 NOTE — Telephone Encounter (Signed)
-----   Message from Heath Lark, MD sent at 04/20/2020  1:01 PM EDT ----- Regarding: Ct scan CT scan not a lot better I am seeing her next week but based on the imaging I think she needs to get pulm consult I have no opening to work her in sooner until next Wed morning She can move her appt to Wednesday or wait until Thursday but I suggest if ok with her to proceed with pulm consult now

## 2020-04-26 ENCOUNTER — Inpatient Hospital Stay (HOSPITAL_BASED_OUTPATIENT_CLINIC_OR_DEPARTMENT_OTHER): Payer: Commercial Managed Care - PPO | Admitting: Hematology and Oncology

## 2020-04-26 ENCOUNTER — Encounter: Payer: Self-pay | Admitting: Hematology and Oncology

## 2020-04-26 ENCOUNTER — Other Ambulatory Visit: Payer: Self-pay

## 2020-04-26 ENCOUNTER — Inpatient Hospital Stay: Payer: Commercial Managed Care - PPO

## 2020-04-26 DIAGNOSIS — R300 Dysuria: Secondary | ICD-10-CM | POA: Insufficient documentation

## 2020-04-26 DIAGNOSIS — R918 Other nonspecific abnormal finding of lung field: Secondary | ICD-10-CM | POA: Diagnosis not present

## 2020-04-26 DIAGNOSIS — C8307 Small cell B-cell lymphoma, spleen: Secondary | ICD-10-CM

## 2020-04-26 DIAGNOSIS — D801 Nonfamilial hypogammaglobulinemia: Secondary | ICD-10-CM

## 2020-04-26 LAB — URINALYSIS, COMPLETE (UACMP) WITH MICROSCOPIC
Bilirubin Urine: NEGATIVE
Glucose, UA: NEGATIVE mg/dL
Hgb urine dipstick: NEGATIVE
Ketones, ur: NEGATIVE mg/dL
Leukocytes,Ua: NEGATIVE
Nitrite: NEGATIVE
Protein, ur: NEGATIVE mg/dL
Specific Gravity, Urine: 1.019 (ref 1.005–1.030)
pH: 5 (ref 5.0–8.0)

## 2020-04-26 NOTE — Assessment & Plan Note (Signed)
I have reviewed imaging studies with her extensively She has new pulmonary infiltrate on the left Based on her recent history, allergic pneumonitis is a possibility She is still having significant coughing on a regular basis I recommend pulmonary consult and she is in agreement

## 2020-04-26 NOTE — Assessment & Plan Note (Signed)
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

## 2020-04-26 NOTE — Progress Notes (Signed)
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[place]) From the lymphoma standpoint, there is no evidence of disease The small lymph node seen on CT imaging is not due to disease, likely reactive, resolving Observe for now I will see her back in 3 months for further follow-up  Pulmonary infiltrate on radiologic exam I have reviewed imaging studies with her extensively She has new pulmonary infiltrate on the left Based on her recent history, allergic pneumonitis is a possibility She is still having significant coughing on a regular basis I recommend pulmonary consult and she is in agreement  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 Urine Culture    Standing Status:   Future    Number of Occurrences:   1    Standing Expiration Date:   04/26/2021  . Urinalysis, Complete w Microscopic    Standing Status:   Future    Number of Occurrences:   1    Standing Expiration Date:   04/26/2021    All questions were answered. The patient knows to call the clinic with any problems, questions or concerns. The total time spent in the appointment was 20 minutes encounter with patients including review of chart and various tests results, discussions about plan of care and coordination of care plan   Heath Lark, MD 04/26/2020 9:37 AM  INTERVAL  HISTORY: Please see below for problem oriented charting. 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 8 mm in short axis right level I a lymph node with Deauville 4 activity, increased from prior. 2. Small but mildly hypermetabolic left axillary lymph node with Deauville 4 activity. 3. Splenic activity only minimally greater than that of the liver, but with a moderate amount of splenomegaly (splenic volume 680 cubic cm). No focal splenic lesion observed. 4. Diffuse mildly increased activity in the skeleton without corresponding CT abnormality. Possibilities might include low-level marrow infiltration or granulocyte stimulation.   07/02/2018 - 11/26/2018 Chemotherapy   The patient had rituximab for chemotherapy treatment.     07/02/2018 - 11/26/2018 Chemotherapy   The patient had rituximab for chemotherapy treatment.     12/30/2018 Cancer Staging   Staging form: Hodgkin and Non-Hodgkin Lymphoma, AJCC 7th Edition - Clinical: S -  Spleen, B - Symptoms - Signed by Heath Lark, MD on 12/30/2018   05/26/2019 Imaging   1. No findings in the chest, abdomen or pelvis to suggest recurrent disease. 2. No acute findings in the chest, abdomen or pelvis. 3. Mild atherosclerosis in the pelvic vasculature. 4. Additional incidental findings, as above.   01/19/2020 Imaging   1. Focal  consolidative opacity in the superior segment right lower lobe with patchy and nodular ground-glass and confluent airspace disease scattered throughout the right lower lobe and right middle lobe. Imaging features compatible with multifocal pneumonia. Follow-up CT chest may be warranted to ensure resolution. 2. Mild right hilar lymphadenopathy, likely reactive.     04/20/2020 Imaging   1. Patchy ground-glass and airspace opacities medially in both lower lobes, improved on the right but new on the left. The consolidation in the superior segment right lower lobe has substantially reduced, although there is some mild continued airspace opacity in the superior segment right lower lobe. Appearance is nonspecific but could be from cryptogenic organizing pneumonia, aspiration pneumonitis, Wegener's granulomatosis, hypersensitivity pneumonitis, chronic eosinophilic pneumonia, or less likely a manifestation of pulmonary lymphoma. 2. Interval fracture of the lateral right seventh rib and anterolateral left seventh rib with early healing response. 3. Prior right hilar lymph node is difficult to measure due to the lack of IV contrast, but probably mildly reduced in size, and currently within normal limits.     REVIEW OF SYSTEMS:   Constitutional: Denies fevers, chills or abnormal weight loss Eyes: Denies blurriness of vision Ears, nose, mouth, throat, and face: Denies mucositis or sore throat Cardiovascular: Denies palpitation, chest discomfort or lower extremity swelling Gastrointestinal:  Denies nausea, heartburn or change in bowel habits Skin: Denies abnormal skin rashes Lymphatics: Denies new lymphadenopathy or easy bruising Neurological:Denies numbness, tingling or new weaknesses Behavioral/Psych: Mood is stable, no new changes  All other systems were reviewed with the patient and are negative.  I have reviewed the past medical history, past surgical history, social history and family history with the  patient and they are unchanged from previous note.  ALLERGIES:  has No Known Allergies.  MEDICATIONS:  Current Outpatient Medications  Medication Sig Dispense Refill  . chlorpheniramine-HYDROcodone (TUSSIONEX) 10-8 MG/5ML SUER Take 5 mLs by mouth 3 (three) times daily. 140 mL 0  . fluticasone (FLONASE) 50 MCG/ACT nasal spray PLACE 1-2 SPRAYS INTO BOTH NOSTRILS DAILY AS NEEDED FOR RHINITIS. 16 mL 1  . furosemide (LASIX) 20 MG tablet Take 1 tablet (20 mg total) by mouth daily. 30 tablet 9  . ibuprofen (ADVIL,MOTRIN) 200 MG tablet Take 400-800 mg by mouth every 6 (six) hours as needed for headache, mild pain or moderate pain.     . pantoprazole (PROTONIX) 20 MG tablet Take 1 tablet (20 mg total) by mouth daily. 90 tablet 2  . Potassium Chloride ER 20 MEQ TBCR TAKE 1 TABLET BY MOUTH EVERY DAY 90 tablet 1   No current facility-administered medications for this visit.    PHYSICAL EXAMINATION: ECOG PERFORMANCE STATUS: 1 - Symptomatic but completely ambulatory  Vitals:   04/26/20 0815  BP: 133/81  Pulse: 80  Resp: 18  SpO2: 100%   Filed Weights   04/26/20 0815  Weight: 191 lb 6.4 oz (86.8 kg)    GENERAL:alert, no distress and comfortable NEURO: alert & oriented x 3 with fluent speech, no focal motor/sensory deficits  LABORATORY DATA:  I have reviewed the data as listed    Component Value Date/Time   NA 141 04/19/2020  1515   NA 138 12/08/2018 0931   NA 139 01/30/2015 0924   K 4.1 04/19/2020 1515   K 4.8 01/30/2015 0924   CL 103 04/19/2020 1515   CO2 25 04/19/2020 1515   CO2 25 01/30/2015 0924   GLUCOSE 83 04/19/2020 1515   GLUCOSE 101 01/30/2015 0924   BUN 9 04/19/2020 1515   BUN 12 12/08/2018 0931   BUN 11.5 01/30/2015 0924   CREATININE 0.85 04/19/2020 1515   CREATININE 0.8 01/30/2015 0924   CALCIUM 9.1 04/19/2020 1515   CALCIUM 9.1 01/30/2015 0924   PROT 6.7 04/19/2020 1515   PROT 5.8 (L) 12/08/2018 0931   PROT 7.0 01/30/2015 0924   ALBUMIN 3.8 04/19/2020 1515    ALBUMIN 4.1 12/08/2018 0931   ALBUMIN 4.2 01/30/2015 0924   AST 17 04/19/2020 1515   AST 21 01/30/2015 0924   ALT 23 04/19/2020 1515   ALT 27 01/30/2015 0924   ALKPHOS 97 04/19/2020 1515   ALKPHOS 77 01/30/2015 0924   BILITOT 0.7 04/19/2020 1515   BILITOT 0.6 12/08/2018 0931   BILITOT 0.48 01/30/2015 0924   GFRNONAA >60 04/19/2020 1515   GFRNONAA 76 02/14/2014 1105   GFRAA >60 04/19/2020 1515   GFRAA 88 02/14/2014 1105    No results found for: SPEP, UPEP  Lab Results  Component Value Date   WBC 6.3 04/19/2020   NEUTROABS 5.1 04/19/2020   HGB 11.8 (L) 04/19/2020   HCT 35.8 (L) 04/19/2020   MCV 85.6 04/19/2020   PLT 216 04/19/2020      Chemistry      Component Value Date/Time   NA 141 04/19/2020 1515   NA 138 12/08/2018 0931   NA 139 01/30/2015 0924   K 4.1 04/19/2020 1515   K 4.8 01/30/2015 0924   CL 103 04/19/2020 1515   CO2 25 04/19/2020 1515   CO2 25 01/30/2015 0924   BUN 9 04/19/2020 1515   BUN 12 12/08/2018 0931   BUN 11.5 01/30/2015 0924   CREATININE 0.85 04/19/2020 1515   CREATININE 0.8 01/30/2015 0924      Component Value Date/Time   CALCIUM 9.1 04/19/2020 1515   CALCIUM 9.1 01/30/2015 0924   ALKPHOS 97 04/19/2020 1515   ALKPHOS 77 01/30/2015 0924   AST 17 04/19/2020 1515   AST 21 01/30/2015 0924   ALT 23 04/19/2020 1515   ALT 27 01/30/2015 0924   BILITOT 0.7 04/19/2020 1515   BILITOT 0.6 12/08/2018 0931   BILITOT 0.48 01/30/2015 0924       RADIOGRAPHIC STUDIES: I have reviewed multiple imaging studies with the patient I have personally reviewed the radiological images as listed and agreed with the findings in the report. CT CHEST WO CONTRAST  Result Date: 04/20/2020 CLINICAL DATA:  Right lower lobe pneumonia diagnosed 2 months ago. Fever and productive cough. History of marginal zone lymphoma involving the spleen. EXAM: CT CHEST WITHOUT CONTRAST TECHNIQUE: Multidetector CT imaging of the chest was performed following the standard protocol  without IV contrast. COMPARISON:  01/19/2020 FINDINGS: Cardiovascular: Unremarkable Mediastinum/Nodes: Right lower paratracheal node 0.8 cm in short axis on image 46/2, previously 0.9 cm by my measurements. Prior right hilar lymph node is difficult to measure due to the lack of IV contrast, but probably 0.9 cm in short axis on image 53/2, previously measured at 1.2 cm. No overtly pathologic adenopathy in the chest currently. Lungs/Pleura: Patchy ground-glass and airspace opacities are present medially in both lower lobes. Overall distribution is improved on the right but new  on the left. The consolidation in the superior segment right lower lobe has substantially reduced, although there is some mild continued airspace opacity in the superior segment denser than the ground-glass opacities, as shown for example on image 52/5. There is previously airspace opacity medially in the right middle lobe. This has intervally cleared. Upper Abdomen: Cholecystectomy. Musculoskeletal: Interval lateral fracture of the right seventh rib with early healing response, image 73/5. Interval fracture of the left seventh rib anteriorly on image 75/5, with early healing response. IMPRESSION: 1. Patchy ground-glass and airspace opacities medially in both lower lobes, improved on the right but new on the left. The consolidation in the superior segment right lower lobe has substantially reduced, although there is some mild continued airspace opacity in the superior segment right lower lobe. Appearance is nonspecific but could be from cryptogenic organizing pneumonia, aspiration pneumonitis, Wegener's granulomatosis, hypersensitivity pneumonitis, chronic eosinophilic pneumonia, or less likely a manifestation of pulmonary lymphoma. 2. Interval fracture of the lateral right seventh rib and anterolateral left seventh rib with early healing response. 3. Prior right hilar lymph node is difficult to measure due to the lack of IV contrast, but  probably mildly reduced in size, and currently within normal limits. Electronically Signed   By: Van Clines M.D.   On: 04/20/2020 12:53

## 2020-04-26 NOTE — Assessment & Plan Note (Signed)
From the lymphoma standpoint, there is no evidence of disease The small lymph node seen on CT imaging is not due to disease, likely reactive, resolving Observe for now I will see her back in 3 months for further follow-up

## 2020-04-27 ENCOUNTER — Telehealth: Payer: Self-pay

## 2020-04-27 LAB — URINE CULTURE: Culture: NO GROWTH

## 2020-04-27 NOTE — Telephone Encounter (Signed)
Spoke with pt and scheduled appt per Dr Alvy Bimler with [facility] for 06/04/20 at 1015. Receptionist states Dr will look at pt's scans and decide if pt should be scheduled sooner then adjust accordingly. Pt & Dr Alvy Bimler have been made aware of appt.

## 2020-04-30 ENCOUNTER — Telehealth: Payer: Self-pay

## 2020-04-30 NOTE — Telephone Encounter (Signed)
-----   Message from Heath Lark, MD sent at 04/30/2020  8:16 AM EDT ----- Regarding: pls call and let her know urine culture is neg

## 2020-04-30 NOTE — Telephone Encounter (Signed)
Called and given below message. She verbalized understanding. 

## 2020-06-04 ENCOUNTER — Ambulatory Visit (INDEPENDENT_AMBULATORY_CARE_PROVIDER_SITE_OTHER): Payer: Commercial Managed Care - PPO | Admitting: Emergency Medicine

## 2020-06-04 ENCOUNTER — Other Ambulatory Visit: Payer: Self-pay

## 2020-06-04 ENCOUNTER — Encounter: Payer: Self-pay | Admitting: Emergency Medicine

## 2020-06-04 VITALS — BP 142/76 | HR 58 | Temp 97.3°F | Ht 66.0 in | Wt 192.4 lb

## 2020-06-04 DIAGNOSIS — R918 Other nonspecific abnormal finding of lung field: Secondary | ICD-10-CM

## 2020-06-04 NOTE — Assessment & Plan Note (Signed)
Her initial clinical presentation in March/April work consistent with a committee acquired pneumonia (opportunistic).  Her initial CT in April looked like a right basilar pneumonia.  Interestingly her follow-up imaging showed some right basilar resolution but some new inflammatory change at the left base.  This raises the question of etiology of the infiltrates.  Differential is broad but includes opportunistic infection (such as MAIC or fungal) given her immunosuppression, inflammatory change directly related to the rituximab that she received in March, noninfectious inflammatory process such as sarcoidosis, eosinophilic pneumonia, hypersensitivity pneumonitis, cryptogenic organizing pneumonia, etc.  Finally must also consider possible recurrence of her lymphoma although the infiltrates are quite atypical and I doubt that this is the case. Plan to perform ANCA, HSP panel, ACE level today Plan to repeat her CT chest in September which would be the 59-month mark.  If her infiltrates persist then I believe she needs bronchoscopy for culture and for tissue sampling.  She understands the rationale and agrees.  We will follow up after the CT to plan next steps.

## 2020-06-04 NOTE — Patient Instructions (Signed)
We will perform lab work today We will repeat your CT scan of the chest with contrast in early September to compare with your prior. If there is residual inflammation on your CT scan of the chest then I recommend that we arrange for bronchoscopy to further evaluate these changes. Follow with Dr. Lamonte Sakai in September after your CT scan to review the results together.

## 2020-06-04 NOTE — H&P (View-Only) (Signed)
Subjective:    Patient ID: Kara Perkins, female    DOB: [birth year], 49 y.o.   MRN: [id number]  HPI 49 year old woman with a history of marginal zone lymphoma with acquired hypogammaglobulinemia post treatment, lots of allergies, little other past medical history.  Never smoker.  She is referred today for evaluation of Last rituximab was early March.  She began to have URI sx and then cough, chest congestion in March that did not resolve. COVID negative at that time. She developed deeper cough, prod of purulent mucous. She was treated with abx x 3, also rx with prednisone for 2 weeks. She did finally improve. She did have a repeat CT in July as below. She had a dust exposure around that time in July as well as a possible chemical exposure from cleaning solution. She is much better, still has a slight cough. Loses her voice with a lot of talking.   CT scan of the chest 05/26/2019 reviewed by me, showed clear lungs without any nodules or masses, no consolidation or infiltrate. CT chest 01/19/2020 reviewed by me, shows focal opacity in the superior segment of the right lower lobe with some associated patchy nodular groundglass in the right lower lobe and right middle lobe, mild right hilar lymphadenopathy (all new) Repeat CT chest 04/20/2020 reviewed by me, showed medial groundglass opacities both lower lobes, improved from prior on the right, new on the left, question etiology     Review of Systems As per HPI  Past Medical History:  Diagnosis Date  . Constipation 05/11/2014  . 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 Iron deficiency anemia 11/05/2011  . 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 Perimenopausal menorrhagia 07/26/2013  . Positive Coombs test   . Positive Coombs test 11/05/2011     No family history on file.   Social History   Socioeconomic History  . Marital status: Married    Spouse name: Not on file  . Number of children: Not on file    . Years of education: Not on file  . Highest education level: Not on file  Occupational History  . Not on file  Tobacco Use  . Smoking status: Never Smoker  . Smokeless tobacco: Never Used  Substance and Sexual Activity  . Alcohol use: No    Alcohol/week: 0.0 standard drinks  . Drug use: No  . Sexual activity: Yes  Other Topics Concern  . Not on file  Social History Narrative  . Not on file   Social Determinants of Health   Financial Resource Strain:   . Difficulty of Paying Living Expenses: Not on file  Food Insecurity:   . Worried About Charity fundraiser in the Last Year: Not on file  . Ran Out of Food in the Last Year: Not on file  Transportation Needs:   . Lack of Transportation (Medical): Not on file  . Lack of Transportation (Non-Medical): Not on file  Physical Activity:   . Days of Exercise per Week: Not on file  . Minutes of Exercise per Session: Not on file  Stress:   . Feeling of Stress : Not on file  Social Connections:   . Frequency of Communication with Friends and Family: Not on file  . Frequency of Social Gatherings with Friends and Family: Not on file  . Attends Religious Services: Not on file  . Active Member of Clubs or Organizations: Not on file  . Attends Archivist Meetings: Not on  file  . Marital Status: Not on file  Intimate Partner Violence:   . Fear of Current or Ex-Partner: Not on file  . Emotionally Abused: Not on file  . Physically Abused: Not on file  . 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  From [place],  No mold or inhaled exposure.    No Known Allergies   Outpatient Medications Prior to Visit  Medication Sig Dispense Refill  . fluticasone (FLONASE) 50 MCG/ACT nasal spray PLACE 1-2 SPRAYS INTO BOTH NOSTRILS DAILY AS NEEDED FOR RHINITIS. 16 mL 1  . furosemide (LASIX) 20 MG tablet Take 1 tablet (20 mg total) by mouth daily. 30 tablet 9  . ibuprofen (ADVIL,MOTRIN) 200 MG tablet Take  400-800 mg by mouth every 6 (six) hours as needed for headache, mild pain or moderate pain.     Marland Kitchen Potassium Chloride ER 20 MEQ TBCR TAKE 1 TABLET BY MOUTH EVERY DAY 90 tablet 1  . chlorpheniramine-HYDROcodone (TUSSIONEX) 10-8 MG/5ML SUER Take 5 mLs by mouth 3 (three) times daily. (Patient not taking: Reported on 06/04/2020) 140 mL 0  . pantoprazole (PROTONIX) 20 MG tablet Take 1 tablet (20 mg total) by mouth daily. (Patient not taking: Reported on 06/04/2020) 90 tablet 2   No facility-administered medications prior to visit.         Objective:   Physical Exam Vitals:   06/04/20 1023  BP: (!) 142/76  Pulse: (!) 58  Temp: (!) 97.3 F (36.3 C)  TempSrc: Temporal  SpO2: 99%  Weight: 192 lb 6.4 oz (87.3 kg)  Height: 5\' 6"  (1.676 m)  Gen: Pleasant, well-nourished, in no distress,  normal affect  ENT: No lesions,  mouth clear,  oropharynx clear, no postnasal drip  Neck: No JVD, no stridor  Lungs: No use of accessory muscles, no crackles or wheezing on normal respiration, no wheeze on forced expiration  Cardiovascular: RRR, heart sounds normal, no murmur or gallops, no peripheral edema  Musculoskeletal: No deformities, no cyanosis or clubbing  Neuro: alert, awake, non focal  Skin: Warm, no lesions or rash      Assessment & Plan:  Pulmonary infiltrates Her initial clinical presentation in March/April work consistent with a committee acquired pneumonia (opportunistic).  Her initial CT in April looked like a right basilar pneumonia.  Interestingly her follow-up imaging showed some right basilar resolution but some new inflammatory change at the left base.  This raises the question of etiology of the infiltrates.  Differential is broad but includes opportunistic infection (such as MAIC or fungal) given her immunosuppression, inflammatory change directly related to the rituximab that she received in March, noninfectious inflammatory process such as sarcoidosis, eosinophilic pneumonia,  hypersensitivity pneumonitis, cryptogenic organizing pneumonia, etc.  Finally must also consider possible recurrence of her lymphoma although the infiltrates are quite atypical and I doubt that this is the case. Plan to perform ANCA, HSP panel, ACE level today Plan to repeat her CT chest in September which would be the 61-month mark.  If her infiltrates persist then I believe she needs bronchoscopy for culture and for tissue sampling.  She understands the rationale and agrees.  We will follow up after the CT to plan next steps.   Baltazar Apo, MD, PhD 06/04/2020, 1:34 PM [facility] [phone] or if no answer [phone]

## 2020-06-04 NOTE — Progress Notes (Signed)
Subjective:    Patient ID: Kara Perkins, female    DOB: [birth year], 49 y.o.   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  Never smoker.  She is referred today for evaluation of Last rituximab was early March.  She began to have URI sx and then cough, chest congestion in March that did not resolve. COVID negative at that time. She developed deeper cough, prod of purulent mucous. She was treated with abx x 3, also rx with prednisone for 2 weeks. She did finally improve. She did have a repeat CT in July as below. She had a dust exposure around that time in July as well as a possible chemical exposure from cleaning solution. She is much better, still has a slight cough. Loses her voice with a lot of talking.   CT scan of the chest 05/26/2019 reviewed by me, showed clear lungs without any nodules or masses, no consolidation or infiltrate. CT chest 01/19/2020 reviewed by me, shows focal opacity in the superior segment of the right lower lobe with some associated patchy nodular groundglass in the right lower lobe and right middle lobe, mild right hilar lymphadenopathy (all new) Repeat CT chest 04/20/2020 reviewed by me, showed medial groundglass opacities both lower lobes, improved from prior on the right, new on the left, question etiology     Review of Systems As per HPI  Past Medical History:  Diagnosis Date  . Constipation 05/11/2014  . 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 Iron deficiency anemia 11/05/2011  . 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 Perimenopausal menorrhagia 07/26/2013  . Positive Coombs test   . Positive Coombs test 11/05/2011     No family history on file.   Social History   Socioeconomic History  . Marital status: Married    Spouse name: Not on file  . Number of children: Not on file    . Years of education: Not on file  . Highest education level: Not on file  Occupational History  . Not on file  Tobacco Use  . Smoking status: Never Smoker  . Smokeless tobacco: Never Used  Substance and Sexual Activity  . Alcohol use: No    Alcohol/week: 0.0 standard drinks  . Drug use: No  . Sexual activity: Yes  Other Topics Concern  . Not on file  Social History Narrative  . Not on file   Social Determinants of Health   Financial Resource Strain:   . Difficulty of Paying Living Expenses: Not on file  Food Insecurity:   . Worried About Charity fundraiser in the Last Year: Not on file  . Ran Out of Food in the Last Year: Not on file  Transportation Needs:   . Lack of Transportation (Medical): Not on file  . Lack of Transportation (Non-Medical): Not on file  Physical Activity:   . Days of Exercise per Week: Not on file  . Minutes of Exercise per Session: Not on file  Stress:   . Feeling of Stress : Not on file  Social Connections:   . Frequency of Communication with Friends and Family: Not on file  . Frequency of Social Gatherings with Friends and Family: Not on file  . Attends Religious Services: Not on file  . Active Member of Clubs or Organizations: Not on file  . Attends Archivist Meetings: Not on  file  . Marital Status: Not on file  Intimate Partner Violence:   . Fear of Current or Ex-Partner: Not on file  . Emotionally Abused: Not on file  . Physically Abused: Not on file  . 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  From [place],  No mold or inhaled exposure.    No Known Allergies   Outpatient Medications Prior to Visit  Medication Sig Dispense Refill  . fluticasone (FLONASE) 50 MCG/ACT nasal spray PLACE 1-2 SPRAYS INTO BOTH NOSTRILS DAILY AS NEEDED FOR RHINITIS. 16 mL 1  . furosemide (LASIX) 20 MG tablet Take 1 tablet (20 mg total) by mouth daily. 30 tablet 9  . ibuprofen (ADVIL,MOTRIN) 200 MG tablet Take  400-800 mg by mouth every 6 (six) hours as needed for headache, mild pain or moderate pain.     Marland Kitchen Potassium Chloride ER 20 MEQ TBCR TAKE 1 TABLET BY MOUTH EVERY DAY 90 tablet 1  . chlorpheniramine-HYDROcodone (TUSSIONEX) 10-8 MG/5ML SUER Take 5 mLs by mouth 3 (three) times daily. (Patient not taking: Reported on 06/04/2020) 140 mL 0  . pantoprazole (PROTONIX) 20 MG tablet Take 1 tablet (20 mg total) by mouth daily. (Patient not taking: Reported on 06/04/2020) 90 tablet 2   No facility-administered medications prior to visit.         Objective:   Physical Exam Vitals:   06/04/20 1023  BP: (!) 142/76  Pulse: (!) 58  Temp: (!) 97.3 F (36.3 C)  TempSrc: Temporal  SpO2: 99%  Weight: 192 lb 6.4 oz (87.3 kg)  Height: 5\' 6"  (1.676 m)  Gen: Pleasant, well-nourished, in no distress,  normal affect  ENT: No lesions,  mouth clear,  oropharynx clear, no postnasal drip  Neck: No JVD, no stridor  Lungs: No use of accessory muscles, no crackles or wheezing on normal respiration, no wheeze on forced expiration  Cardiovascular: RRR, heart sounds normal, no murmur or gallops, no peripheral edema  Musculoskeletal: No deformities, no cyanosis or clubbing  Neuro: alert, awake, non focal  Skin: Warm, no lesions or rash      Assessment & Plan:  Pulmonary infiltrates Her initial clinical presentation in March/April work consistent with a committee acquired pneumonia (opportunistic).  Her initial CT in April looked like a right basilar pneumonia.  Interestingly her follow-up imaging showed some right basilar resolution but some new inflammatory change at the left base.  This raises the question of etiology of the infiltrates.  Differential is broad but includes opportunistic infection (such as MAIC or fungal) given her immunosuppression, inflammatory change directly related to the rituximab that she received in March, noninfectious inflammatory process such as sarcoidosis, eosinophilic pneumonia,  hypersensitivity pneumonitis, cryptogenic organizing pneumonia, etc.  Finally must also consider possible recurrence of her lymphoma although the infiltrates are quite atypical and I doubt that this is the case. Plan to perform ANCA, HSP panel, ACE level today Plan to repeat her CT chest in September which would be the 65-month mark.  If her infiltrates persist then I believe she needs bronchoscopy for culture and for tissue sampling.  She understands the rationale and agrees.  We will follow up after the CT to plan next steps.   Baltazar Apo, MD, PhD 06/04/2020, 1:34 PM [facility] [phone] or if no answer [phone]

## 2020-06-06 LAB — ANGIOTENSIN CONVERTING ENZYME: Angiotensin-Converting Enzyme: 6 U/L — ABNORMAL LOW (ref 9–67)

## 2020-06-06 LAB — ANCA SCREEN W REFLEX TITER: ANCA Screen: NEGATIVE

## 2020-06-08 LAB — HYPERSENSITIVITY PNEUMONITIS
A. Pullulans Abs: NEGATIVE
A.Fumigatus #1 Abs: NEGATIVE
Micropolyspora faeni, IgG: NEGATIVE
Pigeon Serum Abs: NEGATIVE
Thermoact. Saccharii: NEGATIVE
Thermoactinomyces vulgaris, IgG: NEGATIVE

## 2020-06-13 ENCOUNTER — Telehealth: Payer: Self-pay | Admitting: Emergency Medicine

## 2020-06-13 NOTE — Telephone Encounter (Signed)
Called and spoke with pt as it seems like the CT appt is the reason why someone from office had tried to call her. Pt stated that she has been made aware of CT appt. Pt also has f/u appt with RB 9/8. Stated to her that [facility] would go over results of CT and labwork at that appt and she verbalized understanding. Nothing further needed.

## 2020-06-14 ENCOUNTER — Ambulatory Visit (INDEPENDENT_AMBULATORY_CARE_PROVIDER_SITE_OTHER)
Admission: RE | Admit: 2020-06-14 | Discharge: 2020-06-14 | Disposition: A | Discharge status: Home / Self Care | Payer: Commercial Managed Care - PPO | Source: Ambulatory Visit | Attending: Emergency Medicine | Admitting: Emergency Medicine

## 2020-06-14 ENCOUNTER — Other Ambulatory Visit: Payer: Self-pay

## 2020-06-14 DIAGNOSIS — R918 Other nonspecific abnormal finding of lung field: Secondary | ICD-10-CM | POA: Diagnosis not present

## 2020-06-15 ENCOUNTER — Telehealth: Payer: Self-pay | Admitting: Emergency Medicine

## 2020-06-15 NOTE — Telephone Encounter (Signed)
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

## 2020-06-20 ENCOUNTER — Encounter: Payer: Self-pay | Admitting: Emergency Medicine

## 2020-06-20 ENCOUNTER — Ambulatory Visit (INDEPENDENT_AMBULATORY_CARE_PROVIDER_SITE_OTHER): Payer: Commercial Managed Care - PPO | Admitting: Emergency Medicine

## 2020-06-20 ENCOUNTER — Telehealth: Payer: Self-pay | Admitting: Emergency Medicine

## 2020-06-20 ENCOUNTER — Other Ambulatory Visit: Payer: Self-pay

## 2020-06-20 VITALS — BP 142/72 | HR 87 | Temp 97.6°F | Ht 66.0 in | Wt 190.0 lb

## 2020-06-20 DIAGNOSIS — R918 Other nonspecific abnormal finding of lung field: Secondary | ICD-10-CM

## 2020-06-20 NOTE — Telephone Encounter (Signed)
Diagnosis: pulmonary infiltrates bilateral  Procedure: bronchoscopy with BAL and biopsies  Anesthesia: MAC  Do you need Fluro? yes  Priority: high  Date: 9/13, or early am 9/15 -9/16, 9/20, 9/22 - 24 before starting office.   Time: AM  Location: either [facility] or [facility]  Does patient have OSA? no DM? no Or Latex allergy? no  Medication Restriction: no  Anticoagulate/Antiplatelet: no  Pre-op Labs Ordered: CBC, CMP, PT/INR, PTT  Imaging request: no    Please coordinate Pre-op COVID Testing

## 2020-06-20 NOTE — Patient Instructions (Signed)
We will arrange for bronchoscopy to further evaluate your areas of persistent lung inflammation.  Follow with Dr Lamonte Sakai in 1 month

## 2020-06-20 NOTE — Telephone Encounter (Signed)
Done today.

## 2020-06-20 NOTE — Progress Notes (Signed)
Subjective:    Patient ID: Kara Perkins, female    DOB: 31-Jan-1971, 49 y.o.   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  Never smoker.  She is referred today for evaluation of abnormal CT chest Last rituximab was early March.  She began to have URI sx and then cough, chest congestion in March that did not resolve. COVID negative at that time. She developed deeper cough, prod of purulent mucous. She was treated with abx x 3, also rx with prednisone for 2 weeks. She did finally improve. She did have a repeat CT in July as below. She had a dust exposure around that time in July as well as a possible chemical exposure from cleaning solution. She is much better, still has a slight cough. Loses her voice with a lot of talking.   CT scan of the chest 05/26/2019 reviewed by me, showed clear lungs without any nodules or masses, no consolidation or infiltrate. CT chest 01/19/2020 reviewed by me, shows focal opacity in the superior segment of the right lower lobe with some associated patchy nodular groundglass in the right lower lobe and right middle lobe, mild right hilar lymphadenopathy (all new) Repeat CT chest 04/20/2020 reviewed by me, showed medial groundglass opacities both lower lobes, improved from prior on the right, new on the left, question etiology  ROV 06/20/20 --this follow-up visit for 49 year old woman with a history of marginal zone lymphoma, hypogammaglobulinemia (posttreatment) and on rituximab, allergic rhinitis, who follows up today for her abnormal CT chest that was performed when she was dealing with cough, mucus production, dust exposure.  She had improved but persistent lower lobe groundglass changes, some new involvement on the left on the CT 04/20/2020.  Differential diagnosis included autoimmune, opportunistic infection, pneumonitis due to  rituximab, cryptogenic organizing pneumonia, etc.  She underwent a repeat CT chest on 06/14/2020 which I have reviewed, shows persistent mild to moderate patchy groundglass in the bilateral lower lobes right greater than left and with a different distribution compared with July.  Labs 06/04/2020: ANCA negative, HSP panel negative, ACE level normal   Review of Systems As per HPI  Past Medical History:  Diagnosis Date  . Constipation 05/11/2014  . 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 Iron deficiency anemia 11/05/2011  . 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 Perimenopausal menorrhagia 07/26/2013  . Positive Coombs test   . Positive Coombs test 11/05/2011     History reviewed. No pertinent family history.   Social History   Socioeconomic History  . Marital status: Married    Spouse name: Not on file  . Number of children: Not on file  . Years of education: Not on file  . Highest education level: Not on file  Occupational History  . Not on file  Tobacco Use  . Smoking status: Never Smoker  . Smokeless tobacco: Never Used  Substance and Sexual Activity  . Alcohol use: No    Alcohol/week: 0.0 standard drinks  . Drug use: No  . Sexual activity: Yes  Other Topics Concern  . Not on file  Social History Narrative  . Not on file   Social Determinants of Health   Financial Resource Strain:   . Difficulty of Paying Living Expenses: Not on file  Food Insecurity:   . Worried About Charity fundraiser in the Last Year: Not on  file  . [place] in the Last Year: Not on file  Transportation Needs:   . Lack of Transportation (Medical): Not on file  . Lack of Transportation (Non-Medical): Not on file  Physical Activity:   . Days of Exercise per Week: Not on file  . Minutes of Exercise per Session: Not on file  Stress:   . Feeling of Stress : Not on file  Social Connections:   . Frequency of Communication with Friends and Family: Not on  file  . Frequency of Social Gatherings with Friends and Family: Not on file  . Attends Religious Services: Not on file  . Active Member of Clubs or Organizations: Not on file  . Attends Archivist Meetings: Not on file  . Marital Status: Not on file  Intimate Partner Violence:   . Fear of Current or Ex-Partner: Not on file  . Emotionally Abused: Not on file  . Physically Abused: Not on file  . 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  From [place],  No mold or inhaled exposure.    No Known Allergies   Outpatient Medications Prior to Visit  Medication Sig Dispense Refill  . chlorpheniramine-HYDROcodone (TUSSIONEX) 10-8 MG/5ML SUER Take 5 mLs by mouth 3 (three) times daily. 140 mL 0  . fluticasone (FLONASE) 50 MCG/ACT nasal spray PLACE 1-2 SPRAYS INTO BOTH NOSTRILS DAILY AS NEEDED FOR RHINITIS. 16 mL 1  . furosemide (LASIX) 20 MG tablet Take 1 tablet (20 mg total) by mouth daily. 30 tablet 9  . ibuprofen (ADVIL,MOTRIN) 200 MG tablet Take 400-800 mg by mouth every 6 (six) hours as needed for headache, mild pain or moderate pain.     . pantoprazole (PROTONIX) 20 MG tablet Take 1 tablet (20 mg total) by mouth daily. 90 tablet 2  . Potassium Chloride ER 20 MEQ TBCR TAKE 1 TABLET BY MOUTH EVERY DAY 90 tablet 1   No facility-administered medications prior to visit.         Objective:   Physical Exam Vitals:   06/20/20 1152  BP: (!) 142/72  Pulse: 87  Temp: 97.6 F (36.4 C)  TempSrc: Temporal  SpO2: 99%  Weight: 190 lb (86.2 kg)  Height: _0  (1.676 m)    Gen: Pleasant, well-nourished, in no distress,  normal affect  ENT: No lesions,  mouth clear,  oropharynx clear, no postnasal drip  Neck: No JVD, no stridor  Lungs: No use of accessory muscles, no crackles or wheezing on normal respiration, no wheeze on forced expiration  Cardiovascular: RRR, heart sounds normal, no murmur or gallops, no peripheral edema  Musculoskeletal: No  deformities, no cyanosis or clubbing  Neuro: alert, awake, non focal  Skin: Warm, no lesions or rash      Assessment & Plan:  Pulmonary infiltrates Migratory by serial CTs and somewhat progressive.  Differential diagnosis remains broad.  ANCA was negative, HSP panel was negative.  Need to consider all forms of pneumonitis, eosinophilic disease, cryptogenic organizing pneumonia, etc.  Low likelihood but possible would be an atypical presentation of lymphoma with lymphangitic spread.  We will arrange for bronchoscopy with BAL, transbronchial biopsies, hopefully next week.   Baltazar Apo, MD, PhD 06/20/2020, 5:29 PM [facility] [phone] or if no answer [phone]

## 2020-06-20 NOTE — Assessment & Plan Note (Signed)
Migratory by serial CTs and somewhat progressive.  Differential diagnosis remains broad.  ANCA was negative, HSP panel was negative.  Need to consider all forms of pneumonitis, eosinophilic disease, cryptogenic organizing pneumonia, etc.  Low likelihood but possible would be an atypical presentation of lymphoma with lymphangitic spread.  We will arrange for bronchoscopy with BAL, transbronchial biopsies, hopefully next week.

## 2020-06-21 NOTE — Telephone Encounter (Signed)
Called and spoke with patient. Let them know their Bronch is scheduled for 07/02/20 with Dr. Lamonte Sakai at 0730am at [facility].  Patient was instructed to arrive at hospital at [facility]. They were instructed to bring someone with them as they will not be able to drive home from procedure. Patient instructed not to have anything to eat or drink after midnight.    Patient's covid screening is scheduled at [facility] location for 06/29/20 at 0830.  Patient voiced understanding, nothing further needed  Routing to Dr. Lamonte Sakai as Juluis Rainier

## 2020-06-22 ENCOUNTER — Other Ambulatory Visit: Payer: Self-pay

## 2020-06-29 ENCOUNTER — Other Ambulatory Visit
Admission: RE | Admit: 2020-06-29 | Discharge: 2020-06-29 | Disposition: A | Discharge status: Home / Self Care | Payer: Commercial Managed Care - PPO | Source: Ambulatory Visit | Attending: Emergency Medicine | Admitting: Emergency Medicine

## 2020-06-29 ENCOUNTER — Other Ambulatory Visit (HOSPITAL_COMMUNITY): Payer: Commercial Managed Care - PPO

## 2020-06-29 ENCOUNTER — Other Ambulatory Visit: Payer: Self-pay

## 2020-06-29 DIAGNOSIS — Z01812 Encounter for preprocedural laboratory examination: Secondary | ICD-10-CM | POA: Insufficient documentation

## 2020-06-29 DIAGNOSIS — Z20822 Contact with and (suspected) exposure to covid-19: Secondary | ICD-10-CM | POA: Diagnosis not present

## 2020-06-30 LAB — SARS CORONAVIRUS 2 (TAT 6-24 HRS): SARS Coronavirus 2: NEGATIVE

## 2020-07-02 ENCOUNTER — Ambulatory Visit (HOSPITAL_COMMUNITY): Payer: Commercial Managed Care - PPO | Admitting: Certified Registered"

## 2020-07-02 ENCOUNTER — Encounter (HOSPITAL_COMMUNITY)
Admission: RE | Disposition: A | Discharge status: Home / Self Care | Payer: Self-pay | Source: Home / Self Care | Attending: Emergency Medicine

## 2020-07-02 ENCOUNTER — Ambulatory Visit (HOSPITAL_COMMUNITY): Payer: Commercial Managed Care - PPO

## 2020-07-02 ENCOUNTER — Encounter (HOSPITAL_COMMUNITY): Payer: Self-pay | Admitting: Emergency Medicine

## 2020-07-02 ENCOUNTER — Ambulatory Visit (HOSPITAL_COMMUNITY)
Admission: RE | Admit: 2020-07-02 | Discharge: 2020-07-02 | Disposition: A | Discharge status: Home / Self Care | Payer: Commercial Managed Care - PPO | Attending: Emergency Medicine | Admitting: Emergency Medicine

## 2020-07-02 ENCOUNTER — Other Ambulatory Visit: Payer: Self-pay

## 2020-07-02 DIAGNOSIS — Z8572 Personal history of non-Hodgkin lymphomas: Secondary | ICD-10-CM | POA: Insufficient documentation

## 2020-07-02 DIAGNOSIS — Z79899 Other long term (current) drug therapy: Secondary | ICD-10-CM | POA: Insufficient documentation

## 2020-07-02 DIAGNOSIS — J310 Chronic rhinitis: Secondary | ICD-10-CM | POA: Diagnosis not present

## 2020-07-02 DIAGNOSIS — R918 Other nonspecific abnormal finding of lung field: Secondary | ICD-10-CM | POA: Diagnosis present

## 2020-07-02 DIAGNOSIS — J9811 Atelectasis: Secondary | ICD-10-CM | POA: Insufficient documentation

## 2020-07-02 DIAGNOSIS — Z9889 Other specified postprocedural states: Secondary | ICD-10-CM

## 2020-07-02 HISTORY — PX: BRONCHIAL BRUSHINGS: SHX5108

## 2020-07-02 HISTORY — PX: BRONCHIAL WASHINGS: SHX5105

## 2020-07-02 HISTORY — PX: BIOPSY: SHX5522

## 2020-07-02 HISTORY — PX: VIDEO BRONCHOSCOPY: SHX5072

## 2020-07-02 LAB — BODY FLUID CELL COUNT WITH DIFFERENTIAL
Lymphs, Fluid: 5 %
Monocyte-Macrophage-Serous Fluid: 8 % — ABNORMAL LOW (ref 50–90)
Neutrophil Count, Fluid: 87 % — ABNORMAL HIGH (ref 0–25)
Total Nucleated Cell Count, Fluid: 490 cu mm (ref 0–1000)

## 2020-07-02 SURGERY — BRONCHOSCOPY, WITH FLUOROSCOPY
Anesthesia: Monitor Anesthesia Care | Laterality: Bilateral

## 2020-07-02 MED ORDER — LIDOCAINE 2% (20 MG/ML) 5 ML SYRINGE
INTRAMUSCULAR | Status: DC | PRN
  Administered 2020-07-02: 100 mg via INTRAVENOUS

## 2020-07-02 MED ORDER — PROPOFOL 10 MG/ML IV BOLUS
INTRAVENOUS | Status: AC
  Filled 2020-07-02: qty 20

## 2020-07-02 MED ORDER — PROPOFOL 10 MG/ML IV BOLUS
INTRAVENOUS | Status: DC | PRN
  Administered 2020-07-02: 200 mg via INTRAVENOUS

## 2020-07-02 MED ORDER — ONDANSETRON HCL 4 MG/2ML IJ SOLN
4.0000 mg | Freq: Once | INTRAMUSCULAR | Status: AC
  Administered 2020-07-02: 4 mg via INTRAVENOUS

## 2020-07-02 MED ORDER — FENTANYL CITRATE (PF) 100 MCG/2ML IJ SOLN
INTRAMUSCULAR | Status: AC
  Filled 2020-07-02: qty 2

## 2020-07-02 MED ORDER — ONDANSETRON HCL 4 MG/2ML IJ SOLN
INTRAMUSCULAR | Status: DC | PRN
  Administered 2020-07-02: 4 mg via INTRAVENOUS

## 2020-07-02 MED ORDER — FENTANYL CITRATE (PF) 100 MCG/2ML IJ SOLN
INTRAMUSCULAR | Status: DC | PRN
Start: 2020-07-02 — End: 2020-07-02
  Administered 2020-07-02: 25 ug via INTRAVENOUS
  Administered 2020-07-02: 50 ug via INTRAVENOUS

## 2020-07-02 MED ORDER — LACTATED RINGERS IV SOLN: INTRAVENOUS | Status: DC

## 2020-07-02 MED ORDER — ONDANSETRON HCL 4 MG/2ML IJ SOLN
INTRAMUSCULAR | Status: AC
  Filled 2020-07-02: qty 2

## 2020-07-02 MED ORDER — LIDOCAINE HCL 1 % IJ SOLN
INTRAMUSCULAR | Status: DC | PRN
  Administered 2020-07-02: 8 mL

## 2020-07-02 NOTE — Progress Notes (Signed)
Time out for procedure completed at 0744, unable to change time to reflect correct time in [facility]

## 2020-07-02 NOTE — Transfer of Care (Signed)
Immediate Anesthesia Transfer of Care Note  Patient: Kara Perkins  Procedure(s) Performed: VIDEO BRONCHOSCOPY WITH FLUORO (Bilateral )  Patient Location: PACU  Anesthesia Type:General  Level of Consciousness: awake, alert  and oriented  Airway & Oxygen Therapy: Patient Spontanous Breathing and Patient connected to face mask oxygen  Post-op Assessment: Report given to RN, Post -op Vital signs reviewed and stable and Patient moving all extremities X 4  Post vital signs: Reviewed and stable  Last Vitals:  Vitals Value Taken Time  BP    Temp    Pulse    Resp    SpO2      Last Pain:  Vitals:   07/02/20 0651  TempSrc: Oral  PainSc: 0-No pain         Complications: No complications documented.

## 2020-07-02 NOTE — Discharge Instructions (Signed)
Flexible Bronchoscopy, Care After This sheet gives you information about how to care for yourself after your test. Your doctor may also give you more specific instructions. If you have problems or questions, contact your doctor. Follow these instructions at home: Eating and drinking  Do not eat or drink anything (not even water) for 2 hours after your test, or until your numbing medicine (local anesthetic) wears off.  When your numbness is gone and your cough and gag reflexes have come back, you may: ? Eat only soft foods. ? Slowly drink liquids.  The day after the test, go back to your normal diet. Driving  Do not drive for 24 hours if you were given a medicine to help you relax (sedative).  Do not drive or use heavy machinery while taking prescription pain medicine. General instructions   Take over-the-counter and prescription medicines only as told by your doctor.  Return to your normal activities as told. Ask what activities are safe for you.  Do not use any products that have nicotine or tobacco in them. This includes cigarettes and e-cigarettes. If you need help quitting, ask your doctor.  Keep all follow-up visits as told by your doctor. This is important. It is very important if you had a tissue sample (biopsy) taken. Get help right away if:  You have shortness of breath that gets worse.  You get light-headed.  You feel like you are going to pass out (faint).  You have chest pain.  You cough up: ? More than a little blood. ? More blood than before. Summary  Do not eat or drink anything (not even water) for 2 hours after your test, or until your numbing medicine wears off.  Do not use cigarettes. Do not use e-cigarettes.  Get help right away if you have chest pain.  Please call our office for any questions or concerns. [phone].   This information is not intended to replace advice given to you by your health care provider. Make sure you discuss any  questions you have with your health care provider. Document Revised: 09/11/2017 Document Reviewed: 10/17/2016 Elsevier Patient Education  2020 [employer].

## 2020-07-02 NOTE — Anesthesia Postprocedure Evaluation (Signed)
Anesthesia Post Note  Patient: Kara Perkins  Procedure(s) Performed: VIDEO BRONCHOSCOPY WITH FLUORO (Bilateral )     Patient location during evaluation: PACU Anesthesia Type: General Level of consciousness: awake and alert Pain management: pain level controlled Vital Signs Assessment: post-procedure vital signs reviewed and stable Respiratory status: spontaneous breathing, nonlabored ventilation, respiratory function stable and patient connected to nasal cannula oxygen Cardiovascular status: blood pressure returned to baseline and stable Postop Assessment: no apparent nausea or vomiting Anesthetic complications: no   No complications documented.  Last Vitals:  Vitals:   07/02/20 0651 07/02/20 0843  BP: (!) 149/74 (!) 156/85  Pulse: 64 88  Resp: 15 16  Temp: 36.7 C   SpO2: 100% 100%    Last Pain:  Vitals:   07/02/20 0651  TempSrc: Oral  PainSc: 0-No pain                 Revin Corker S

## 2020-07-02 NOTE — Anesthesia Preprocedure Evaluation (Signed)
Anesthesia Evaluation  Patient identified by MRN, date of birth, ID band Patient awake    Reviewed: Allergy & Precautions, NPO status , Patient's Chart, lab work & pertinent test results  Airway Mallampati: II  TM Distance: >3 FB Neck ROM: Full    Dental no notable dental hx.    Pulmonary pneumonia, unresolved,    Pulmonary exam normal breath sounds clear to auscultation + decreased breath sounds      Cardiovascular negative cardio ROS Normal cardiovascular exam Rhythm:Regular Rate:Normal     Neuro/Psych negative neurological ROS  negative psych ROS   GI/Hepatic negative GI ROS, Neg liver ROS,   Endo/Other  negative endocrine ROS  Renal/GU negative Renal ROS  negative genitourinary   Musculoskeletal negative musculoskeletal ROS (+)   Abdominal   Peds negative pediatric ROS (+)  Hematology negative hematology ROS (+)   Anesthesia Other Findings   Reproductive/Obstetrics negative OB ROS                             Anesthesia Physical Anesthesia Plan  ASA: III  Anesthesia Plan: MAC   Post-op Pain Management:    Induction: Intravenous  PONV Risk Score and Plan: 0  Airway Management Planned: Nasal Cannula  Additional Equipment:   Intra-op Plan:   Post-operative Plan:   Informed Consent: I have reviewed the patients History and Physical, chart, labs and discussed the procedure including the risks, benefits and alternatives for the proposed anesthesia with the patient or authorized representative who has indicated his/her understanding and acceptance.     Dental advisory given  Plan Discussed with: CRNA and Surgeon  Anesthesia Plan Comments:         Anesthesia Quick Evaluation

## 2020-07-02 NOTE — Progress Notes (Signed)
Airfields clear uppers bilaterally, diminished bases R >L. Non-productive cough

## 2020-07-02 NOTE — Op Note (Signed)
[facility] Patient Name: Haliegh Khurana Procedure Date: 07/02/2020 MRN: [id number] Attending MD: Collene Gobble , MD Date of Birth: [birth year] CSN: [id number] Age: 49 Admit Type: Outpatient Ethnicity: Not Hispanic or Latino Procedure:             Bronchoscopy Indications:           Bilateral infiltrates Providers:             Collene Gobble, MD, Wynonia Sours, RN, Cherylynn Ridges,                         Technician Referring MD:           Medicines:             Lidocaine 1% applied to cords 6 mL, Lidocaine 1%                         applied to the tracheobronchial tree 10 mL Complications:         No immediate complications Estimated Blood Loss:  Estimated blood loss was minimal. Procedure:      Pre-Anesthesia Assessment:      - A History and Physical has been performed. Patient meds and allergies       have been reviewed. The risks and benefits of the procedure and the       sedation options and risks were discussed with the patient. All       questions were answered and informed consent was obtained. Patient       identification and proposed procedure were verified prior to the       procedure by the physician in the pre-procedure area. Mental Status       Examination: alert and oriented. Airway Examination: normal       oropharyngeal airway. Respiratory Examination: clear to auscultation. CV       Examination: normal. ASA Grade Assessment: I - A normal healthy patient.       After reviewing the risks and benefits, the patient was deemed in       satisfactory condition to undergo the procedure. The anesthesia plan was       to use monitored anesthesia care (MAC). Immediately prior to       administration of medications, the patient was re-assessed for adequacy       to receive sedatives. The heart rate, respiratory rate, oxygen       saturations, blood pressure, adequacy of pulmonary ventilation, and       response to care were monitored  throughout the procedure. The physical       status of the patient was re-assessed after the procedure.      After obtaining informed consent, the bronchoscope was passed under       direct vision. Throughout the procedure, the patient's blood pressure,       pulse, and oxygen saturations were monitored continuously. the [id number]       ([id number]) Olympus therapeutic bronchoscope was introduced through the       mouth, via laryngeal mask airway and advanced to the tracheobronchial       tree. The procedure was accomplished without difficulty. The patient       tolerated the procedure well. Total fluoroscopy time was 1 minute, 39       seconds. Findings:      The laryngeal mask airway is in good position. The vocal cords  appear       normal. The subglottic space is normal. The trachea is of normal       caliber. The carina is sharp. The tracheobronchial tree was examined to       at least the first subsegmental level. Bronchial mucosa and anatomy are       normal; there are no endobronchial lesions, and no secretions.      Fluoroscopy guided transbronchial brushings of an area of infiltration       were obtained in the superior segment of the right lower lobe with a       cytology brush and sent for cell count, bacterial culture, viral smears       & culture, and fungal & AFB analysis and cytology. Five samples were       obtained.      Transbronchial biopsies of an area of infiltration were performed in the       superior segment of the right lower lobe using forceps and sent for       histopathology examination. The procedure was guided by fluoroscopy.       Five biopsy passes were performed. Five biopsy samples were obtained.      Bronchoalveolar lavage was performed in the RLL superior segment (B6) of       the lung and sent for cell count, bacterial culture, viral smears &       culture, and fungal & AFB analysis and cytology. 120 mL of fluid were       instilled. 30 mL were returned.  The return was clear. There were no       mucoid plugs in the return fluid. Impression:      - Bilateral infiltrates      - The airway examination was normal.      - Transbronchial brushings were obtained from RLL superior segment      - Transbronchial lung biopsies were performed in RLL superior segment      - Bronchoalveolar lavage was performed in RLL superior segment. Moderate Sedation:      N/A: per anesthesia care Recommendation:      - Await BAL, biopsy and brushing results. Procedure Code(s):      --- Professional ---      [phone], Bronchoscopy, rigid or flexible, including fluoroscopic guidance,       when performed; with transbronchial lung biopsy(s), single lobe      99357, Bronchoscopy, rigid or flexible, including fluoroscopic guidance,       when performed; with bronchial alveolar lavage      31623, Bronchoscopy, rigid or flexible, including fluoroscopic guidance,       when performed; with brushing or protected brushings Diagnosis Code(s):      --- Professional ---      J98.11, [employer] CPT copyright 2019 American Medical Association. All rights reserved. The codes documented in this report are preliminary and upon coder review may  be revised to meet current compliance requirements. Collene Gobble, MD Collene Gobble, MD 07/02/2020 8:37:55 AM Number of Addenda: 0 Scope In: Scope Out:

## 2020-07-02 NOTE — Interval H&P Note (Signed)
History and Physical Interval Note:  07/02/2020 7:24 AM  Kara Perkins  has presented today for surgery, with the diagnosis of PULMONARY INFILTRATES BILATERALLY.  The various methods of treatment have been discussed with the patient and family. After consideration of risks, benefits and other options for treatment, the patient has consented to  Procedure(s): VIDEO BRONCHOSCOPY WITH FLUORO (Bilateral) as a surgical intervention.  The patient's history has been reviewed, patient examined, no change in status, stable for surgery.  I have reviewed the patient's chart and labs.  Questions were answered to the patient's satisfaction.     Collene Gobble

## 2020-07-02 NOTE — Anesthesia Procedure Notes (Signed)
Procedure Name: LMA Insertion Date/Time: 07/02/2020 7:42 AM Performed by: Niel Hummer, CRNA Pre-anesthesia Checklist: Patient identified, Emergency Drugs available, Suction available and Patient being monitored Patient Re-evaluated:Patient Re-evaluated prior to induction Oxygen Delivery Method: Circle system utilized Preoxygenation: Pre-oxygenation with 100% oxygen Induction Type: IV induction LMA: LMA inserted LMA Size: 4.0 Number of attempts: 1 Dental Injury: Teeth and Oropharynx as per pre-operative assessment

## 2020-07-03 ENCOUNTER — Encounter (HOSPITAL_COMMUNITY): Payer: Self-pay | Admitting: Emergency Medicine

## 2020-07-03 LAB — SURGICAL PATHOLOGY

## 2020-07-04 LAB — CULTURE, RESPIRATORY W GRAM STAIN: Culture: NORMAL

## 2020-07-04 LAB — CYTOLOGY - NON PAP

## 2020-07-04 LAB — ACID FAST SMEAR (AFB, MYCOBACTERIA): Acid Fast Smear: NEGATIVE

## 2020-07-04 LAB — BODY FLUID CULTURE: Culture: NORMAL

## 2020-07-09 ENCOUNTER — Telehealth: Payer: Self-pay | Admitting: Emergency Medicine

## 2020-07-09 NOTE — Telephone Encounter (Signed)
Spoke with the patient to review path and cytology. Granulomas seen. All cx data still pending. Will follow

## 2020-07-20 ENCOUNTER — Other Ambulatory Visit: Payer: Self-pay

## 2020-07-20 DIAGNOSIS — C8307 Small cell B-cell lymphoma, spleen: Secondary | ICD-10-CM

## 2020-07-23 ENCOUNTER — Inpatient Hospital Stay: Payer: Commercial Managed Care - PPO | Attending: Hematology and Oncology

## 2020-07-23 ENCOUNTER — Encounter: Payer: Self-pay | Admitting: Emergency Medicine

## 2020-07-23 ENCOUNTER — Ambulatory Visit (INDEPENDENT_AMBULATORY_CARE_PROVIDER_SITE_OTHER): Payer: Commercial Managed Care - PPO | Admitting: Emergency Medicine

## 2020-07-23 ENCOUNTER — Encounter: Payer: Self-pay | Admitting: Hematology and Oncology

## 2020-07-23 ENCOUNTER — Other Ambulatory Visit: Payer: Self-pay

## 2020-07-23 ENCOUNTER — Inpatient Hospital Stay (HOSPITAL_BASED_OUTPATIENT_CLINIC_OR_DEPARTMENT_OTHER): Payer: Commercial Managed Care - PPO | Admitting: Hematology and Oncology

## 2020-07-23 DIAGNOSIS — C8307 Small cell B-cell lymphoma, spleen: Secondary | ICD-10-CM

## 2020-07-23 DIAGNOSIS — Z79899 Other long term (current) drug therapy: Secondary | ICD-10-CM | POA: Insufficient documentation

## 2020-07-23 DIAGNOSIS — R059 Cough, unspecified: Secondary | ICD-10-CM | POA: Insufficient documentation

## 2020-07-23 DIAGNOSIS — D801 Nonfamilial hypogammaglobulinemia: Secondary | ICD-10-CM

## 2020-07-23 DIAGNOSIS — C884 Extranodal marginal zone B-cell lymphoma of mucosa-associated lymphoid tissue [MALT-lymphoma]: Secondary | ICD-10-CM | POA: Insufficient documentation

## 2020-07-23 DIAGNOSIS — R918 Other nonspecific abnormal finding of lung field: Secondary | ICD-10-CM | POA: Insufficient documentation

## 2020-07-23 LAB — CBC WITH DIFFERENTIAL (CANCER CENTER ONLY)
Abs Immature Granulocytes: 0.02 10*3/uL (ref 0.00–0.07)
Basophils Absolute: 0 10*3/uL (ref 0.0–0.1)
Basophils Relative: 1 %
Eosinophils Absolute: 0.2 10*3/uL (ref 0.0–0.5)
Eosinophils Relative: 4 %
HCT: 38.9 % (ref 36.0–46.0)
Hemoglobin: 13.1 g/dL (ref 12.0–15.0)
Immature Granulocytes: 1 %
Lymphocytes Relative: 13 %
Lymphs Abs: 0.6 10*3/uL — ABNORMAL LOW (ref 0.7–4.0)
MCH: 28.9 pg (ref 26.0–34.0)
MCHC: 33.7 g/dL (ref 30.0–36.0)
MCV: 85.9 fL (ref 80.0–100.0)
Monocytes Absolute: 0.3 10*3/uL (ref 0.1–1.0)
Monocytes Relative: 6 %
Neutro Abs: 3.3 10*3/uL (ref 1.7–7.7)
Neutrophils Relative %: 75 %
Platelet Count: 166 10*3/uL (ref 150–400)
RBC: 4.53 MIL/uL (ref 3.87–5.11)
RDW: 14.4 % (ref 11.5–15.5)
WBC Count: 4.3 10*3/uL (ref 4.0–10.5)
nRBC: 0 % (ref 0.0–0.2)

## 2020-07-23 LAB — CMP (CANCER CENTER ONLY)
ALT: 36 U/L (ref 0–44)
AST: 20 U/L (ref 15–41)
Albumin: 4 g/dL (ref 3.5–5.0)
Alkaline Phosphatase: 78 U/L (ref 38–126)
Anion gap: 6 (ref 5–15)
BUN: 15 mg/dL (ref 6–20)
CO2: 30 mmol/L (ref 22–32)
Calcium: 9.4 mg/dL (ref 8.9–10.3)
Chloride: 103 mmol/L (ref 98–111)
Creatinine: 0.98 mg/dL (ref 0.44–1.00)
GFR, Estimated: >60 mL/min (ref >60)
Glucose, Bld: 127 mg/dL — ABNORMAL HIGH (ref 70–99)
Potassium: 4 mmol/L (ref 3.5–5.1)
Sodium: 139 mmol/L (ref 135–145)
Total Bilirubin: 0.5 mg/dL (ref 0.3–1.2)
Total Protein: 6.6 g/dL (ref 6.5–8.1)

## 2020-07-23 NOTE — Assessment & Plan Note (Signed)
Unclear etiology, some waxing and waning properties based on most recent CT.  Autoimmune screening negative for vasculitis, HSP, cell counts etc. bronchoscopy reassuring, cultures negative.  Question whether could related to rituximab, some exposure with a pneumonitis.  Plan to repeat her CT in December for interval change.    We will plan to repeat your CT chest without contrast in December 2021.  Follow with Dr Lamonte Sakai in December after your CT to review together.  Call if your cough returns or any other symptoms develop.

## 2020-07-23 NOTE — Assessment & Plan Note (Signed)
From the lymphoma standpoint, there is no evidence of disease Observe for now I will see her back in 6 months for further follow-up

## 2020-07-23 NOTE — Progress Notes (Signed)
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   All questions were answered. The patient knows to call the clinic with any problems, questions or concerns. The total time spent in the appointment was 20 minutes encounter with patients including review of chart and various tests results, discussions about plan of care and coordination of care plan   Heath Lark, MD 07/23/2020 12:25 PM  INTERVAL HISTORY: Please see below for problem oriented charting. 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 8 mm in short axis right level I a lymph node with Deauville 4 activity, increased from prior. 2. Small but mildly hypermetabolic left axillary lymph node with Deauville 4 activity. 3. Splenic activity only minimally greater than that of the liver, but with a moderate amount of splenomegaly (splenic volume 680 cubic cm). No focal splenic lesion observed. 4. Diffuse mildly increased activity in the skeleton without corresponding CT abnormality. Possibilities might include low-level marrow infiltration or granulocyte stimulation.   07/02/2018 - 11/26/2018 Chemotherapy   The patient had rituximab for chemotherapy treatment.     07/02/2018 - 11/26/2018 Chemotherapy   The patient had rituximab for chemotherapy treatment.     12/30/2018 Cancer Staging   Staging form: Hodgkin and Non-Hodgkin Lymphoma, AJCC 7th Edition - Clinical: S - Spleen, B - Symptoms - Signed by Heath Lark, MD on 12/30/2018   05/26/2019 Imaging   1. No findings in the chest, abdomen or pelvis to suggest recurrent disease. 2. No acute findings in the chest, abdomen or pelvis. 3. Mild atherosclerosis in the pelvic vasculature. 4. Additional incidental findings, as above.   01/19/2020 Imaging   1. Focal consolidative opacity in the superior segment right lower lobe with patchy and nodular ground-glass and confluent airspace disease scattered throughout the right lower lobe and right middle lobe. Imaging features compatible with multifocal pneumonia. Follow-up CT chest may be warranted to ensure resolution. 2. Mild right hilar lymphadenopathy, likely reactive.     04/20/2020 Imaging   1. Patchy ground-glass and airspace opacities medially in both lower lobes, improved on the right but new on the left.  The consolidation in the superior segment right lower lobe has substantially reduced, although there is some mild continued airspace opacity in the superior  segment right lower lobe. Appearance is nonspecific but could be from cryptogenic organizing pneumonia, aspiration pneumonitis, Wegener's granulomatosis, hypersensitivity pneumonitis, chronic eosinophilic pneumonia, or less likely a manifestation of pulmonary lymphoma. 2. Interval fracture of the lateral right seventh rib and anterolateral left seventh rib with early healing response. 3. Prior right hilar lymph node is difficult to measure due to the lack of IV contrast, but probably mildly reduced in size, and currently within normal limits.     REVIEW OF SYSTEMS:   Constitutional: Denies fevers, chills or abnormal weight loss Eyes: Denies blurriness of vision Ears, nose, mouth, throat, and face: Denies mucositis or sore throat Cardiovascular: Denies palpitation, chest discomfort or lower extremity swelling Gastrointestinal:  Denies nausea, heartburn or change in bowel habits Skin: Denies abnormal skin rashes Lymphatics: Denies new lymphadenopathy or easy bruising Neurological:Denies numbness, tingling or new weaknesses Behavioral/Psych: Mood is stable, no new changes  All other systems were reviewed with the patient and are negative.  I have reviewed the past medical history, past surgical history, social history and family history with the patient and they are unchanged from previous note.  ALLERGIES:  has No Known Allergies.  MEDICATIONS:  Current Outpatient Medications  Medication Sig Dispense Refill  . acetaminophen (TYLENOL) 500 MG tablet Take 500-1,000 mg by mouth every 6 (six) hours as needed (pain.). (Patient not taking: Reported on 07/23/2020)    . ibuprofen (ADVIL,MOTRIN) 200 MG tablet Take 400-800 mg by mouth every 6 (six) hours as needed for headache, mild pain or moderate pain.     Marland Kitchen Phenylephrine-APAP-guaiFENesin (MUCINEX FAST-MAX) 10-650-400 MG/20ML LIQD Take 20 mLs by mouth 3 (three) times daily as needed (cough/congestion.). (Patient not taking: Reported on 07/23/2020)      No current facility-administered medications for this visit.    PHYSICAL EXAMINATION: ECOG PERFORMANCE STATUS: 1 - Symptomatic but completely ambulatory  Vitals:   07/23/20 1149  BP: (!) 167/84  Pulse: 77  Resp: 18  Temp: (!) 97.4 F (36.3 C)  SpO2: 100%   Filed Weights   07/23/20 1149  Weight: 203 lb 12.8 oz (92.4 kg)    GENERAL:alert, no distress and comfortable Musculoskeletal:no cyanosis of digits and no clubbing  NEURO: alert & oriented x 3 with fluent speech, no focal motor/sensory deficits  LABORATORY DATA:  I have reviewed the data as listed    Component Value Date/Time   NA 141 04/19/2020 1515   NA 138 12/08/2018 0931   NA 139 01/30/2015 0924   K 4.1 04/19/2020 1515   K 4.8 01/30/2015 0924   CL 103 04/19/2020 1515   CO2 25 04/19/2020 1515   CO2 25 01/30/2015 0924   GLUCOSE 83 04/19/2020 1515   GLUCOSE 101 01/30/2015 0924   BUN 9 04/19/2020 1515   BUN 12 12/08/2018 0931   BUN 11.5 01/30/2015 0924   CREATININE 0.85 04/19/2020 1515   CREATININE 0.8 01/30/2015 0924   CALCIUM 9.1 04/19/2020 1515   CALCIUM 9.1 01/30/2015 0924   PROT 6.7 04/19/2020 1515   PROT 5.8 (L) 12/08/2018 0931   PROT 7.0 01/30/2015 0924   ALBUMIN 3.8 04/19/2020 1515   ALBUMIN 4.1 12/08/2018 0931   ALBUMIN 4.2 01/30/2015 0924   AST 17 04/19/2020 1515   AST 21 01/30/2015 0924   ALT 23 04/19/2020 1515   ALT 27 01/30/2015 0924   ALKPHOS 97 04/19/2020 1515  ALKPHOS 77 01/30/2015 0924   BILITOT 0.7 04/19/2020 1515   BILITOT 0.6 12/08/2018 0931   BILITOT 0.48 01/30/2015 0924   GFRNONAA >60 04/19/2020 1515   GFRNONAA 76 02/14/2014 1105   GFRAA >60 04/19/2020 1515   GFRAA 88 02/14/2014 1105    No results found for: SPEP, UPEP  Lab Results  Component Value Date   WBC 4.3 07/23/2020   NEUTROABS 3.3 07/23/2020   HGB 13.1 07/23/2020   HCT 38.9 07/23/2020   MCV 85.9 07/23/2020   PLT 166 07/23/2020      Chemistry      Component Value Date/Time   NA 141 04/19/2020 1515    NA 138 12/08/2018 0931   NA 139 01/30/2015 0924   K 4.1 04/19/2020 1515   K 4.8 01/30/2015 0924   CL 103 04/19/2020 1515   CO2 25 04/19/2020 1515   CO2 25 01/30/2015 0924   BUN 9 04/19/2020 1515   BUN 12 12/08/2018 0931   BUN 11.5 01/30/2015 0924   CREATININE 0.85 04/19/2020 1515   CREATININE 0.8 01/30/2015 0924      Component Value Date/Time   CALCIUM 9.1 04/19/2020 1515   CALCIUM 9.1 01/30/2015 0924   ALKPHOS 97 04/19/2020 1515   ALKPHOS 77 01/30/2015 0924   AST 17 04/19/2020 1515   AST 21 01/30/2015 0924   ALT 23 04/19/2020 1515   ALT 27 01/30/2015 0924   BILITOT 0.7 04/19/2020 1515   BILITOT 0.6 12/08/2018 0931   BILITOT 0.48 01/30/2015 0924       RADIOGRAPHIC STUDIES: I have reviewed her recent CT imaging I have personally reviewed the radiological images as listed and agreed with the findings in the report. DG CHEST PORT 1 VIEW  Result Date: 07/02/2020 CLINICAL DATA:  Post bronchoscopy.  Pulmonary infiltrate EXAM: PORTABLE CHEST 1 VIEW COMPARISON:  08/17/2015.  CT chest 06/14/2020 FINDINGS: No complication post bronchoscopy.  No pneumothorax or effusion Patchy airspace disease on the right similar to recent CT. Left lung clear. Heart size and vascularity normal. IMPRESSION: Mild airspace disease on the right. No complication from bronchoscopy Electronically Signed   By: Franchot Gallo M.D.   On: 07/02/2020 08:57   DG C-ARM BRONCHOSCOPY  Result Date: 07/02/2020 C-ARM BRONCHOSCOPY: Fluoroscopy was utilized by the requesting physician.  No radiographic interpretation.

## 2020-07-23 NOTE — Progress Notes (Signed)
Subjective:    Patient ID: Kara Perkins, female    DOB: 1971/02/28, 49 y.o.   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  Never smoker.  She is referred today for evaluation of abnormal CT chest Last rituximab was early March.  She began to have URI sx and then cough, chest congestion in March that did not resolve. COVID negative at that time. She developed deeper cough, prod of purulent mucous. She was treated with abx x 3, also rx with prednisone for 2 weeks. She did finally improve. She did have a repeat CT in July as below. She had a dust exposure around that time in July as well as a possible chemical exposure from cleaning solution. She is much better, still has a slight cough. Loses her voice with a lot of talking.   CT scan of the chest 05/26/2019 reviewed by me, showed clear lungs without any nodules or masses, no consolidation or infiltrate. CT chest 01/19/2020 reviewed by me, shows focal opacity in the superior segment of the right lower lobe with some associated patchy nodular groundglass in the right lower lobe and right middle lobe, mild right hilar lymphadenopathy (all new) Repeat CT chest 04/20/2020 reviewed by me, showed medial groundglass opacities both lower lobes, improved from prior on the right, new on the left, question etiology  ROV 06/20/20 --this follow-up visit for 49 year old woman with a history of marginal zone lymphoma, hypogammaglobulinemia (posttreatment) and on rituximab, allergic rhinitis, who follows up today for her abnormal CT chest that was performed when she was dealing with cough, mucus production, dust exposure.  She had improved but persistent lower lobe groundglass changes, some new involvement on the left on the CT 04/20/2020.  Differential diagnosis included autoimmune, opportunistic infection, pneumonitis due to  rituximab, cryptogenic organizing pneumonia, etc.  She underwent a repeat CT chest on 06/14/2020 which I have reviewed, shows persistent mild to moderate patchy groundglass in the bilateral lower lobes right greater than left and with a different distribution compared with July.  Labs 06/04/2020: ANCA negative, HSP panel negative, ACE level normal  ROV 07/23/20 --[name] is a 49 year old never smoker whom we have followed for pulmonary infiltrates on CT chest, waxing and waning with some new involvement on her most recent scan from 06/14/2020.  She has marginal zone lymphoma and hypogammaglobulinemia formerly on rituximab, allergic rhinitis.  ANCA, HSP, ACE level all unrevealing as above.  She underwent bronchoscopy on 07/02/2020.  Transbronchial brushings and biopsies benign, cell count with a neutrophilic predominance, fungal and AFB cultures are negative.  BAL with macrophages.  No overt granulomas reported.    Review of Systems As per HPI  Past Medical History:  Diagnosis Date  . Constipation 05/11/2014  . 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 Iron deficiency anemia 11/05/2011  . 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 Perimenopausal menorrhagia 07/26/2013  . Positive Coombs test   . Positive Coombs test 11/05/2011     No family history on file.   Social History   Socioeconomic History  . Marital status: Married    Spouse name: Not on file  . Number of children: Not on file  . Years of education: Not on file  . Highest education level: Not on file  Occupational History  . Not on file  Tobacco Use  . Smoking status: Never Smoker  . Smokeless  tobacco: Never Used  Substance and Sexual Activity  . Alcohol use: No    Alcohol/week: 0.0 standard drinks  . Drug use: No  . Sexual activity: Yes  Other Topics Concern  . Not on file  Social History Narrative  . Not on file   Social Determinants of Health   Financial Resource Strain:   . Difficulty  of Paying Living Expenses: Not on file  Food Insecurity:   . Worried About Charity fundraiser in the Last Year: Not on file  . Ran Out of Food in the Last Year: Not on file  Transportation Needs:   . Lack of Transportation (Medical): Not on file  . Lack of Transportation (Non-Medical): Not on file  Physical Activity:   . Days of Exercise per Week: Not on file  . Minutes of Exercise per Session: Not on file  Stress:   . Feeling of Stress : Not on file  Social Connections:   . Frequency of Communication with Friends and Family: Not on file  . Frequency of Social Gatherings with Friends and Family: Not on file  . Attends Religious Services: Not on file  . Active Member of Clubs or Organizations: Not on file  . Attends Archivist Meetings: Not on file  . Marital Status: Not on file  Intimate Partner Violence:   . Fear of Current or Ex-Partner: Not on file  . Emotionally Abused: Not on file  . Physically Abused: Not on file  . 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  From [place],  No mold or inhaled exposure.    No Known Allergies   Outpatient Medications Prior to Visit  Medication Sig Dispense Refill  . ibuprofen (ADVIL,MOTRIN) 200 MG tablet Take 400-800 mg by mouth every 6 (six) hours as needed for headache, mild pain or moderate pain.     Marland Kitchen acetaminophen (TYLENOL) 500 MG tablet Take 500-1,000 mg by mouth every 6 (six) hours as needed (pain.). (Patient not taking: Reported on 07/23/2020)    . Phenylephrine-APAP-guaiFENesin (MUCINEX FAST-MAX) 10-650-400 MG/20ML LIQD Take 20 mLs by mouth 3 (three) times daily as needed (cough/congestion.). (Patient not taking: Reported on 07/23/2020)     No facility-administered medications prior to visit.         Objective:   Physical Exam Vitals:   07/23/20 0906  BP: 138/80  Pulse: 69  Temp: (!) 97.5 F (36.4 C)  TempSrc: Temporal  SpO2: 98%  Weight: 202 lb 9.6 oz (91.9 kg)  Height: _0   (1.676 m)    Gen: Pleasant, well-nourished, in no distress,  normal affect  ENT: No lesions,  mouth clear,  oropharynx clear, no postnasal drip  Neck: No JVD, no stridor  Lungs: No use of accessory muscles, no crackles or wheezing on normal respiration, no wheeze on forced expiration  Cardiovascular: RRR, heart sounds normal, no murmur or gallops, no peripheral edema  Musculoskeletal: No deformities, no cyanosis or clubbing  Neuro: alert, awake, non focal  Skin: Warm, no lesions or rash      Assessment & Plan:  Pulmonary infiltrates Unclear etiology, some waxing and waning properties based on most recent CT.  Autoimmune screening negative for vasculitis, HSP, cell counts etc. bronchoscopy reassuring, cultures negative.  Question whether could related to rituximab, some exposure with a pneumonitis.  Plan to repeat her CT in December for interval change.    We will plan to repeat your CT chest without contrast  in December 2021.  Follow with Dr Lamonte Sakai in December after your CT to review together.  Call if your cough returns or any other symptoms develop.   Baltazar Apo, MD, PhD 07/23/2020, 9:24 AM [facility] [phone] or if no answer [phone]

## 2020-07-23 NOTE — Assessment & Plan Note (Signed)
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

## 2020-07-23 NOTE — Patient Instructions (Addendum)
We will plan to repeat your CT chest without contrast in December 2021.  Follow with Dr Lamonte Sakai in December after your CT to review together.  Call if your cough returns or any other symptoms develop.

## 2020-07-23 NOTE — Assessment & Plan Note (Signed)
Her recent CT imaging show mild persistent groundglass appearance She had an appointment to see pulmonologist Recent bronchoscopy and biopsy were negative She will be seen again in December for further follow-up

## 2020-07-23 NOTE — Addendum Note (Signed)
Addended by: Gavin Potters R on: 07/23/2020 09:35 AM   Modules accepted: Orders

## 2020-07-24 LAB — IGM: IgM (Immunoglobulin M), Srm: 12 mg/dL — ABNORMAL LOW (ref 26–217)

## 2020-07-27 ENCOUNTER — Ambulatory Visit: Payer: Commercial Managed Care - PPO | Admitting: Hematology and Oncology

## 2020-07-27 ENCOUNTER — Other Ambulatory Visit: Payer: Commercial Managed Care - PPO

## 2020-08-04 LAB — FUNGUS CULTURE WITH STAIN

## 2020-08-04 LAB — FUNGUS CULTURE RESULT

## 2020-08-04 LAB — FUNGAL ORGANISM REFLEX

## 2020-08-15 LAB — ACID FAST CULTURE WITH REFLEXED SENSITIVITIES (MYCOBACTERIA): Acid Fast Culture: NEGATIVE

## 2020-09-25 ENCOUNTER — Other Ambulatory Visit: Payer: Self-pay

## 2020-09-25 ENCOUNTER — Ambulatory Visit (INDEPENDENT_AMBULATORY_CARE_PROVIDER_SITE_OTHER)
Admission: RE | Admit: 2020-09-25 | Discharge: 2020-09-25 | Disposition: A | Discharge status: Home / Self Care | Payer: Commercial Managed Care - PPO | Source: Ambulatory Visit | Attending: Emergency Medicine | Admitting: Emergency Medicine

## 2020-09-25 DIAGNOSIS — R918 Other nonspecific abnormal finding of lung field: Secondary | ICD-10-CM

## 2020-10-16 ENCOUNTER — Encounter: Payer: Self-pay | Admitting: Hematology and Oncology

## 2020-10-30 ENCOUNTER — Ambulatory Visit (INDEPENDENT_AMBULATORY_CARE_PROVIDER_SITE_OTHER): Payer: Commercial Managed Care - PPO | Admitting: Emergency Medicine

## 2020-10-30 ENCOUNTER — Encounter: Payer: Self-pay | Admitting: Emergency Medicine

## 2020-10-30 ENCOUNTER — Other Ambulatory Visit: Payer: Self-pay

## 2020-10-30 DIAGNOSIS — R918 Other nonspecific abnormal finding of lung field: Secondary | ICD-10-CM

## 2020-10-30 NOTE — Patient Instructions (Addendum)
Your CT scan of the chest done on 09/25/2020 shows that the inflammatory changes we have been following have completely resolved.  This is good news. We do not need to repeat your chest imaging before your planned follow-up and imaging with Dr. Alvy Bimler. If you develop any new symptoms including breathing difficulty, cough, chest discomfort then we would probably repeat a chest x-ray, consider repeating CT chest earlier. Follow with Dr Lamonte Sakai if needed for any new symptoms or problems, or if there are any changes noted on your future chest imaging.

## 2020-10-30 NOTE — Assessment & Plan Note (Signed)
Bilateral scattered groundglass infiltrates, unclear etiology.  Question whether they may have related to her rituximab.  She is also had multiple viral infections, possibly COVID-19.  She then developed proven COVID-19 at the beginning of this year.  She is asymptomatic currently.  Based on resolution on CT chest I do not think we need to do any other evaluation.  Bronchoscopy was reassuring in September.  She will follow-up imaging with oncology, probably annually.  If infiltrates return or if she develops any new respiratory symptoms then I want her to come back to see me so that we can continue to work-up.

## 2020-10-30 NOTE — Progress Notes (Signed)
Subjective:    Patient ID: Kara Perkins, female    DOB: [birth year], 50 y.o.   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  She had improved but persistent lower lobe groundglass changes, some new involvement on the left on the CT 04/20/2020.  Differential diagnosis included autoimmune, opportunistic infection, pneumonitis due to rituximab, cryptogenic organizing pneumonia, etc.  She underwent a repeat CT chest on 06/14/2020 which I have reviewed, shows persistent mild to moderate patchy groundglass in the bilateral lower lobes right greater than left and with a different distribution compared with July.  Labs 06/04/2020: ANCA negative, HSP panel negative, ACE level normal  ROV 07/23/20 --Kara Perkins is a 50 year old never smoker whom we have followed for pulmonary infiltrates on CT chest, waxing and waning with some new involvement on her most recent scan from 06/14/2020.  She has marginal zone lymphoma and hypogammaglobulinemia formerly on rituximab, allergic rhinitis.  ANCA, HSP, ACE level all unrevealing as above.  She underwent bronchoscopy on 07/02/2020.  Transbronchial brushings and biopsies benign, cell count with a neutrophilic predominance, fungal and AFB cultures are negative.  BAL with macrophages.  No overt granulomas reported.   ROV 10/30/20 --follow-up visit 51 year old woman, never smoker with a history of marginal zone lymphoma and associated hypogammaglobulinemia formally on rituximab.  We performed bronchoscopy to evaluate waxing and waning scattered pulmonary infiltrates on CT chest of unclear etiology.  Bronchoscopy was reassuring.,  Culture negative, cytology negative.  We repeated her CT scan of the chest on  09/25/2020 and I have reviewed.  This shows that her previously identified scattered pulmonary infiltrates have completely resolved consistent with an inflammatory process. Still not entirely clear what caused her infiltrates. She has had a viral syndrome, possibly COVID (not proved) over this time course. She was then formally dx with COVID 14 days ago.  She feels well. Back to her baseline.   Review of Systems As per HPI     Objective:   Physical Exam Vitals:   10/30/20 1633  BP: 118/78  Pulse: 78  Temp: 98.4 F (36.9 C)  SpO2: 98%  Weight: 206 lb 12.8 oz (93.8 kg)  Height: _0  (1.676 m)    Gen: Pleasant, well-nourished, in no distress,  normal affect  ENT: No lesions,  mouth clear,  oropharynx clear, no postnasal drip  Neck: No JVD, no stridor  Lungs: No use of accessory muscles, no crackles or wheezing on normal respiration, no wheeze on forced expiration  Cardiovascular: RRR, heart sounds normal, no murmur or gallops, no peripheral edema  Musculoskeletal: No deformities, no cyanosis or clubbing  Neuro: alert, awake, non focal  Skin: Warm, no lesions or rash      Assessment & Plan:  Pulmonary infiltrates Bilateral scattered groundglass infiltrates, unclear etiology.  Question whether they may have related to her rituximab.  She is also had multiple viral infections, possibly COVID-19.  She then developed proven COVID-19 at the beginning of this year.  She is asymptomatic currently.  Based on resolution on CT chest I do not think we need to do any other evaluation.  Bronchoscopy was reassuring in September.  She will follow-up imaging with oncology, probably annually.  If infiltrates return or if she develops any new respiratory symptoms then I  want her to come back to see me so that we can continue to work-up.   Baltazar Apo, MD, PhD 10/30/2020, 4:59 PM [facility] [phone] or if no answer [phone]

## 2020-12-09 ENCOUNTER — Encounter: Payer: Self-pay | Admitting: Hematology and Oncology

## 2020-12-10 ENCOUNTER — Other Ambulatory Visit: Payer: Self-pay | Admitting: Hematology and Oncology

## 2020-12-10 ENCOUNTER — Telehealth: Payer: Self-pay

## 2020-12-10 DIAGNOSIS — D591 Autoimmune hemolytic anemia, unspecified: Secondary | ICD-10-CM

## 2020-12-10 DIAGNOSIS — C8307 Small cell B-cell lymphoma, spleen: Secondary | ICD-10-CM

## 2020-12-10 DIAGNOSIS — D801 Nonfamilial hypogammaglobulinemia: Secondary | ICD-10-CM

## 2020-12-10 NOTE — Telephone Encounter (Signed)
Called and scheduled below appts. She is aware of appt time/dates.

## 2020-12-10 NOTE — Telephone Encounter (Signed)
-----   Message from Heath Lark, MD sent at 12/10/2020  1:48 PM EST ----- Regarding: appt Pls call her Schedule labs early morning 3/2 3/9 schedule virtual visit form 1 to 130 pm, 30 mins

## 2020-12-12 ENCOUNTER — Inpatient Hospital Stay: Payer: Commercial Managed Care - PPO | Attending: Hematology and Oncology

## 2020-12-12 ENCOUNTER — Other Ambulatory Visit: Payer: Self-pay

## 2020-12-12 DIAGNOSIS — D591 Autoimmune hemolytic anemia, unspecified: Secondary | ICD-10-CM

## 2020-12-12 DIAGNOSIS — C8307 Small cell B-cell lymphoma, spleen: Secondary | ICD-10-CM

## 2020-12-12 DIAGNOSIS — R748 Abnormal levels of other serum enzymes: Secondary | ICD-10-CM | POA: Diagnosis not present

## 2020-12-12 DIAGNOSIS — Z8572 Personal history of non-Hodgkin lymphomas: Secondary | ICD-10-CM | POA: Insufficient documentation

## 2020-12-12 DIAGNOSIS — Z9221 Personal history of antineoplastic chemotherapy: Secondary | ICD-10-CM | POA: Insufficient documentation

## 2020-12-12 DIAGNOSIS — D801 Nonfamilial hypogammaglobulinemia: Secondary | ICD-10-CM | POA: Diagnosis not present

## 2020-12-12 LAB — CBC WITH DIFFERENTIAL/PLATELET
Abs Immature Granulocytes: 0.02 10*3/uL (ref 0.00–0.07)
Basophils Absolute: 0 10*3/uL (ref 0.0–0.1)
Basophils Relative: 1 %
Eosinophils Absolute: 0.1 10*3/uL (ref 0.0–0.5)
Eosinophils Relative: 3 %
HCT: 38.2 % (ref 36.0–46.0)
Hemoglobin: 13.2 g/dL (ref 12.0–15.0)
Immature Granulocytes: 1 %
Lymphocytes Relative: 16 %
Lymphs Abs: 0.7 10*3/uL (ref 0.7–4.0)
MCH: 30.3 pg (ref 26.0–34.0)
MCHC: 34.6 g/dL (ref 30.0–36.0)
MCV: 87.8 fL (ref 80.0–100.0)
Monocytes Absolute: 0.3 10*3/uL (ref 0.1–1.0)
Monocytes Relative: 6 %
Neutro Abs: 3.3 10*3/uL (ref 1.7–7.7)
Neutrophils Relative %: 73 %
Platelets: 149 10*3/uL — ABNORMAL LOW (ref 150–400)
RBC: 4.35 MIL/uL (ref 3.87–5.11)
RDW: 13 % (ref 11.5–15.5)
WBC: 4.4 10*3/uL (ref 4.0–10.5)
nRBC: 0 % (ref 0.0–0.2)

## 2020-12-12 LAB — COMPREHENSIVE METABOLIC PANEL
ALT: 47 U/L — ABNORMAL HIGH (ref 0–44)
AST: 21 U/L (ref 15–41)
Albumin: 4.2 g/dL (ref 3.5–5.0)
Alkaline Phosphatase: 68 U/L (ref 38–126)
Anion gap: 8 (ref 5–15)
BUN: 18 mg/dL (ref 6–20)
CO2: 27 mmol/L (ref 22–32)
Calcium: 9.1 mg/dL (ref 8.9–10.3)
Chloride: 103 mmol/L (ref 98–111)
Creatinine, Ser: 0.94 mg/dL (ref 0.44–1.00)
GFR, Estimated: >60 mL/min (ref >60)
Glucose, Bld: 101 mg/dL — ABNORMAL HIGH (ref 70–99)
Potassium: 4.2 mmol/L (ref 3.5–5.1)
Sodium: 138 mmol/L (ref 135–145)
Total Bilirubin: 0.9 mg/dL (ref 0.3–1.2)
Total Protein: 6.6 g/dL (ref 6.5–8.1)

## 2020-12-13 LAB — IGG, IGA, IGM
IgA: 141 mg/dL (ref 87–352)
IgG (Immunoglobin G), Serum: 618 mg/dL (ref 586–1602)
IgM (Immunoglobulin M), Srm: 29 mg/dL (ref 26–217)

## 2020-12-19 ENCOUNTER — Other Ambulatory Visit: Payer: Self-pay

## 2020-12-19 ENCOUNTER — Encounter: Payer: Self-pay | Admitting: Hematology and Oncology

## 2020-12-19 ENCOUNTER — Inpatient Hospital Stay (HOSPITAL_BASED_OUTPATIENT_CLINIC_OR_DEPARTMENT_OTHER): Payer: Commercial Managed Care - PPO | Admitting: Hematology and Oncology

## 2020-12-19 ENCOUNTER — Telehealth: Payer: Self-pay | Admitting: Hematology and Oncology

## 2020-12-19 DIAGNOSIS — R748 Abnormal levels of other serum enzymes: Secondary | ICD-10-CM

## 2020-12-19 DIAGNOSIS — D801 Nonfamilial hypogammaglobulinemia: Secondary | ICD-10-CM | POA: Diagnosis not present

## 2020-12-19 DIAGNOSIS — C8307 Small cell B-cell lymphoma, spleen: Secondary | ICD-10-CM

## 2020-12-19 NOTE — Assessment & Plan Note (Signed)
From the lymphoma standpoint, there is no evidence of disease Observe for now I will see her back in 3 months for further follow-up

## 2020-12-19 NOTE — Telephone Encounter (Signed)
Scheduled appointments per 03/09 schedule message. Contacted patient, patient is aware.

## 2020-12-19 NOTE — Progress Notes (Signed)
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   INTERVAL HISTORY: Please see below for problem oriented charting. 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 8 mm in short axis right level I a lymph node with Deauville 4 activity, increased from prior. 2. Small but mildly hypermetabolic left axillary lymph node with Deauville 4 activity. 3. Splenic activity only  minimally greater than that of the liver, but with a moderate amount of splenomegaly (splenic volume 680 cubic cm). No focal splenic lesion observed. 4. Diffuse mildly increased activity in the skeleton without corresponding CT abnormality. Possibilities might include low-level marrow infiltration or granulocyte stimulation.   07/02/2018 - 11/26/2018 Chemotherapy   The patient had rituximab for chemotherapy treatment.     07/02/2018 - 11/26/2018 Chemotherapy   The patient had rituximab for chemotherapy treatment.     12/30/2018 Cancer Staging   Staging form: Hodgkin and Non-Hodgkin Lymphoma, AJCC 7th Edition - Clinical: S - Spleen, B - Symptoms - Signed by Heath Lark, MD on 12/30/2018   05/26/2019 Imaging   1. No findings in the chest, abdomen or pelvis to suggest recurrent disease. 2. No acute findings in the chest, abdomen or pelvis. 3. Mild atherosclerosis in the pelvic vasculature. 4. Additional incidental findings, as above.   01/19/2020 Imaging   1. Focal consolidative opacity in the superior segment right lower lobe with patchy and nodular ground-glass and confluent airspace disease scattered throughout the right lower lobe and right middle lobe. Imaging features compatible with multifocal pneumonia. Follow-up CT chest may be warranted to ensure resolution. 2. Mild right hilar lymphadenopathy, likely reactive.     04/20/2020 Imaging   1. Patchy ground-glass and airspace opacities medially in both lower lobes, improved on the right but new on the left. The consolidation in the superior segment right lower lobe has substantially reduced, although there is some mild continued airspace opacity in the superior segment right lower lobe. Appearance is nonspecific but could be from cryptogenic organizing pneumonia, aspiration pneumonitis, Wegener's granulomatosis, hypersensitivity pneumonitis, chronic  eosinophilic pneumonia, or less likely a manifestation of pulmonary lymphoma. 2. Interval fracture  of the lateral right seventh rib and anterolateral left seventh rib with early healing response. 3. Prior right hilar lymph node is difficult to measure due to the lack of IV contrast, but probably mildly reduced in size, and currently within normal limits.     REVIEW OF SYSTEMS:   Constitutional: Denies fevers, chills or abnormal weight loss Eyes: Denies blurriness of vision Ears, nose, mouth, throat, and face: Denies mucositis or sore throat Respiratory: Denies cough, dyspnea or wheezes Cardiovascular: Denies palpitation, chest discomfort Gastrointestinal:  Denies nausea, heartburn or change in bowel habits Skin: Denies abnormal skin rashes Lymphatics: Denies new lymphadenopathy or easy bruising Neurological:Denies numbness, tingling or new weaknesses Behavioral/Psych: Mood is stable, no new changes  Extremities: No lower extremity edema All other systems were reviewed with the patient and are negative.  I have reviewed the past medical history, past surgical history, social history and family history with the patient and they are unchanged from previous note.  ALLERGIES:  has No Known Allergies.  MEDICATIONS:  Current Outpatient Medications  Medication Sig Dispense Refill  . acetaminophen (TYLENOL) 500 MG tablet Take 500-1,000 mg by mouth every 6 (six) hours as needed (pain.).    Marland Kitchen ibuprofen (ADVIL,MOTRIN) 200 MG tablet Take 400-800 mg by mouth every 6 (six) hours as needed for headache, mild pain or moderate pain.     Marland Kitchen Phenylephrine-APAP-guaiFENesin (MUCINEX FAST-MAX) 10-650-400 MG/20ML LIQD Take 20 mLs by mouth 3 (three) times daily as needed (cough/congestion.). (Patient not taking: No sig reported)     No current facility-administered medications for this visit.    PHYSICAL EXAMINATION: ECOG PERFORMANCE STATUS: 0 - Asymptomatic  LABORATORY DATA:  I have reviewed the data as listed CMP Latest Ref Rng & Units 12/12/2020 07/23/2020 04/19/2020  Glucose 70 - 99 mg/dL 101(H)  127(H) 83  BUN 6 - 20 mg/dL '18 15 9  ' Creatinine 0.44 - 1.00 mg/dL 0.94 0.98 0.85  Sodium 135 - 145 mmol/L 138 139 141  Potassium 3.5 - 5.1 mmol/L 4.2 4.0 4.1  Chloride 98 - 111 mmol/L 103 103 103  CO2 22 - 32 mmol/L '27 30 25  ' Calcium 8.9 - 10.3 mg/dL 9.1 9.4 9.1  Total Protein 6.5 - 8.1 g/dL 6.6 6.6 6.7  Total Bilirubin 0.3 - 1.2 mg/dL 0.9 0.5 0.7  Alkaline Phos 38 - 126 U/L 68 78 97  AST 15 - 41 U/L '21 20 17  ' ALT 0 - 44 U/L 47(H) 36 23    Lab Results  Component Value Date   WBC 4.4 12/12/2020   HGB 13.2 12/12/2020   HCT 38.2 12/12/2020   MCV 87.8 12/12/2020   PLT 149 (L) 12/12/2020   NEUTROABS 3.3 12/12/2020     I discussed the assessment and treatment plan with the patient. The patient was provided an opportunity to ask questions and all were answered. The patient agreed with the plan and demonstrated an understanding of the instructions. The patient was advised to call back or seek an in-person evaluation if the symptoms worsen or if the condition fails to improve as anticipated.    I spent 20 minutes for the appointment reviewing test results, discuss management and coordination of care.  Heath Lark, MD 12/19/2020 1:31 PM

## 2020-12-19 NOTE — Assessment & Plan Note (Signed)
Her elevated liver enzymes are likely due to fatty liver change She is not symptomatic Observe only

## 2020-12-19 NOTE — Assessment & Plan Note (Addendum)
She had history of acquired hypogammaglobulinemia secondary to treatment Recent repeat immunoglobulin levels are adequate At this point in time, she is not considered immunocompromised We will continue to monitor closely in her next visit There is no contraindication for her to proceed to working full-time as directed by her occupation

## 2021-01-22 ENCOUNTER — Ambulatory Visit: Payer: Commercial Managed Care - PPO | Admitting: Hematology and Oncology

## 2021-01-22 ENCOUNTER — Other Ambulatory Visit: Payer: Commercial Managed Care - PPO

## 2021-01-27 DIAGNOSIS — G5603 Carpal tunnel syndrome, bilateral upper limbs: Secondary | ICD-10-CM | POA: Insufficient documentation

## 2021-03-13 ENCOUNTER — Other Ambulatory Visit: Payer: Self-pay

## 2021-03-13 ENCOUNTER — Inpatient Hospital Stay: Payer: Commercial Managed Care - PPO | Attending: Hematology and Oncology

## 2021-03-13 DIAGNOSIS — C8307 Small cell B-cell lymphoma, spleen: Secondary | ICD-10-CM

## 2021-03-13 DIAGNOSIS — Z79899 Other long term (current) drug therapy: Secondary | ICD-10-CM | POA: Diagnosis not present

## 2021-03-13 DIAGNOSIS — D801 Nonfamilial hypogammaglobulinemia: Secondary | ICD-10-CM

## 2021-03-13 DIAGNOSIS — Z7984 Long term (current) use of oral hypoglycemic drugs: Secondary | ICD-10-CM | POA: Insufficient documentation

## 2021-03-13 DIAGNOSIS — D591 Autoimmune hemolytic anemia, unspecified: Secondary | ICD-10-CM

## 2021-03-13 LAB — COMPREHENSIVE METABOLIC PANEL
ALT: 57 U/L — ABNORMAL HIGH (ref 0–44)
AST: 24 U/L (ref 15–41)
Albumin: 3.9 g/dL (ref 3.5–5.0)
Alkaline Phosphatase: 73 U/L (ref 38–126)
Anion gap: 12 (ref 5–15)
BUN: 11 mg/dL (ref 6–20)
CO2: 26 mmol/L (ref 22–32)
Calcium: 9.3 mg/dL (ref 8.9–10.3)
Chloride: 103 mmol/L (ref 98–111)
Creatinine, Ser: 0.84 mg/dL (ref 0.44–1.00)
GFR, Estimated: >60 mL/min (ref >60)
Glucose, Bld: 114 mg/dL — ABNORMAL HIGH (ref 70–99)
Potassium: 4 mmol/L (ref 3.5–5.1)
Sodium: 141 mmol/L (ref 135–145)
Total Bilirubin: 1.1 mg/dL (ref 0.3–1.2)
Total Protein: 6.5 g/dL (ref 6.5–8.1)

## 2021-03-13 LAB — CBC WITH DIFFERENTIAL/PLATELET
Abs Immature Granulocytes: 0.03 10*3/uL (ref 0.00–0.07)
Basophils Absolute: 0 10*3/uL (ref 0.0–0.1)
Basophils Relative: 1 %
Eosinophils Absolute: 0.1 10*3/uL (ref 0.0–0.5)
Eosinophils Relative: 3 %
HCT: 38.1 % (ref 36.0–46.0)
Hemoglobin: 13 g/dL (ref 12.0–15.0)
Immature Granulocytes: 1 %
Lymphocytes Relative: 20 %
Lymphs Abs: 0.8 10*3/uL (ref 0.7–4.0)
MCH: 30.2 pg (ref 26.0–34.0)
MCHC: 34.1 g/dL (ref 30.0–36.0)
MCV: 88.6 fL (ref 80.0–100.0)
Monocytes Absolute: 0.3 10*3/uL (ref 0.1–1.0)
Monocytes Relative: 8 %
Neutro Abs: 2.8 10*3/uL (ref 1.7–7.7)
Neutrophils Relative %: 67 %
Platelets: 159 10*3/uL (ref 150–400)
RBC: 4.3 MIL/uL (ref 3.87–5.11)
RDW: 13 % (ref 11.5–15.5)
WBC: 4.1 10*3/uL (ref 4.0–10.5)
nRBC: 0 % (ref 0.0–0.2)

## 2021-03-14 LAB — IGG, IGA, IGM
IgA: 126 mg/dL (ref 87–352)
IgG (Immunoglobin G), Serum: 567 mg/dL — ABNORMAL LOW (ref 586–1602)
IgM (Immunoglobulin M), Srm: 34 mg/dL (ref 26–217)

## 2021-03-22 ENCOUNTER — Telehealth: Payer: Self-pay | Admitting: Hematology and Oncology

## 2021-03-22 ENCOUNTER — Other Ambulatory Visit: Payer: Self-pay

## 2021-03-22 ENCOUNTER — Encounter: Payer: Self-pay | Admitting: Hematology and Oncology

## 2021-03-22 ENCOUNTER — Inpatient Hospital Stay (HOSPITAL_BASED_OUTPATIENT_CLINIC_OR_DEPARTMENT_OTHER): Payer: Commercial Managed Care - PPO | Admitting: Hematology and Oncology

## 2021-03-22 DIAGNOSIS — R635 Abnormal weight gain: Secondary | ICD-10-CM | POA: Diagnosis not present

## 2021-03-22 DIAGNOSIS — R748 Abnormal levels of other serum enzymes: Secondary | ICD-10-CM | POA: Diagnosis not present

## 2021-03-22 DIAGNOSIS — D801 Nonfamilial hypogammaglobulinemia: Secondary | ICD-10-CM | POA: Diagnosis not present

## 2021-03-22 DIAGNOSIS — C8307 Small cell B-cell lymphoma, spleen: Secondary | ICD-10-CM

## 2021-03-22 MED ORDER — METFORMIN HCL 500 MG PO TABS: 500.0000 mg | ORAL_TABLET | Freq: Every day | ORAL | 3 refills | Status: DC

## 2021-03-22 NOTE — Assessment & Plan Note (Signed)
She stated she has gained a lot of weight She is asking for help I recommend low-dose metformin daily I explained to her potential side effects from metformin including diarrhea We discussed the risk and benefits of referral to medical weight management center and she is in agreement

## 2021-03-22 NOTE — Assessment & Plan Note (Signed)
From the lymphoma standpoint, there is no evidence of disease Observe for now I will see her back in 6 months for further follow-up

## 2021-03-22 NOTE — Assessment & Plan Note (Signed)
She had history of acquired hypogammaglobulinemia secondary to treatment Recent repeat IgG At this point in time, she is not considered immunocompromised We will continue to monitor closely in her next visit There is no contraindication for her to proceed to working full-time as directed by her occupation

## 2021-03-22 NOTE — Progress Notes (Signed)
HEMATOLOGY-ONCOLOGY ELECTRONIC VISIT PROGRESS NOTE  Patient Care Team: Sofie Hartigan, MD as PCP - General (Family Medicine) Heath Lark, MD as Consulting Physician (Hematology and Oncology)  I connected with the patient via telephone conference.  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) She had history of acquired hypogammaglobulinemia secondary to treatment Recent repeat IgG At this point in time, she is not considered immunocompromised We will continue to monitor closely in her next visit There is no contraindication for her to proceed to working full-time as directed by her occupation  Elevated liver enzymes Her elevated liver enzymes are likely due to fatty liver change She is not symptomatic Observe only  Excessive weight gain She stated she has gained a lot of weight She is asking for help I recommend low-dose metformin daily I explained to her potential side effects from metformin including diarrhea We discussed the risk and benefits of referral to medical weight management center and she is in agreement  Orders Placed This Encounter  Procedures   Amb Ref to Medical Weight Management    Referral Priority:   Routine    Referral Type:   Consultation    Number of Visits Requested:   1    INTERVAL HISTORY: Please see below for problem oriented charting. 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 8 mm in short axis right level I a lymph node with Deauville 4 activity, increased from prior. 2. Small but mildly hypermetabolic left axillary lymph node with Deauville 4 activity. 3. Splenic activity only minimally greater than that of the liver, but with a moderate amount of splenomegaly (splenic volume 680 cubic cm). No focal splenic lesion observed. 4. Diffuse mildly increased activity in the skeleton without corresponding CT abnormality. Possibilities might include low-level marrow infiltration or granulocyte stimulation.    07/02/2018 - 11/26/2018 Chemotherapy   The patient had rituximab for chemotherapy treatment.      07/02/2018 - 11/26/2018 Chemotherapy   The patient had rituximab for chemotherapy treatment.      12/30/2018 Cancer Staging   Staging form: Hodgkin and Non-Hodgkin Lymphoma, AJCC 7th Edition - Clinical: S - Spleen, B - Symptoms - Signed by Heath Lark, MD on 12/30/2018    05/26/2019 Imaging   1. No findings in the chest, abdomen or pelvis to suggest recurrent disease. 2. No acute findings in the chest, abdomen or pelvis. 3. Mild atherosclerosis in the pelvic vasculature. 4. Additional incidental findings, as above.   01/19/2020 Imaging   1. Focal consolidative opacity in the superior segment right lower lobe with patchy and nodular ground-glass and  confluent airspace disease scattered throughout the right lower lobe and right middle lobe. Imaging features compatible with multifocal pneumonia. Follow-up CT chest may be warranted to ensure resolution. 2. Mild right hilar lymphadenopathy, likely reactive.     04/20/2020  Imaging   1. Patchy ground-glass and airspace opacities medially in both lower lobes, improved on the right but new on the left. The consolidation in the superior segment right lower lobe has substantially reduced, although there is some mild continued airspace opacity in the superior segment right lower lobe. Appearance is nonspecific but could be from cryptogenic organizing pneumonia, aspiration pneumonitis, Wegener's granulomatosis, hypersensitivity pneumonitis, chronic eosinophilic pneumonia, or less likely a manifestation of pulmonary lymphoma. 2. Interval fracture of the lateral right seventh rib and anterolateral left seventh rib with early healing response. 3. Prior right hilar lymph node is difficult to measure due to the lack of IV contrast, but probably mildly reduced in size, and currently within normal limits.     REVIEW OF SYSTEMS:   Constitutional: Denies fevers, chills or abnormal weight loss Eyes: Denies blurriness of vision Ears, nose, mouth, throat, and face: Denies mucositis or sore throat Cardiovascular: Denies palpitation, chest discomfort Gastrointestinal:  Denies nausea, heartburn or change in bowel habits Skin: Denies abnormal skin rashes Lymphatics: Denies new lymphadenopathy or easy bruising Neurological:Denies numbness, tingling or new weaknesses Behavioral/Psych: Mood is stable, no new changes  Extremities: No lower extremity edema All other systems were reviewed with the patient and are negative.  I have reviewed the past medical history, past surgical history, social history and family history with the patient and they are unchanged from previous note.  ALLERGIES:  has No Known Allergies.  MEDICATIONS:  Current Outpatient Medications  Medication Sig Dispense Refill   metFORMIN (GLUCOPHAGE) 500 MG tablet Take 1 tablet (500 mg total) by mouth daily. 30 tablet 3   acetaminophen (TYLENOL) 500 MG tablet Take 500-1,000 mg by mouth every 6 (six) hours as needed  (pain.).     ibuprofen (ADVIL,MOTRIN) 200 MG tablet Take 400-800 mg by mouth every 6 (six) hours as needed for headache, mild pain or moderate pain.      No current facility-administered medications for this visit.    PHYSICAL EXAMINATION: ECOG PERFORMANCE STATUS: 1 - Symptomatic but completely ambulatory  LABORATORY DATA:  I have reviewed the data as listed CMP Latest Ref Rng & Units 03/13/2021 12/12/2020 07/23/2020  Glucose 70 - 99 mg/dL 114(H) 101(H) 127(H)  BUN 6 - 20 mg/dL '11 18 15  ' Creatinine 0.44 - 1.00 mg/dL 0.84 0.94 0.98  Sodium 135 - 145 mmol/L 141 138 139  Potassium 3.5 - 5.1 mmol/L 4.0 4.2 4.0  Chloride 98 - 111 mmol/L 103 103 103  CO2 22 - 32 mmol/L '26 27 30  ' Calcium 8.9 - 10.3 mg/dL 9.3 9.1 9.4  Total Protein 6.5 - 8.1 g/dL 6.5 6.6 6.6  Total Bilirubin 0.3 - 1.2 mg/dL 1.1 0.9 0.5  Alkaline Phos 38 - 126 U/L 73 68 78  AST 15 - 41 U/L '24 21 20  ' ALT 0 - 44 U/L 57(H) 47(H) 36    Lab Results  Component Value Date   WBC 4.1 03/13/2021   HGB 13.0 03/13/2021   HCT 38.1 03/13/2021   MCV 88.6 03/13/2021   PLT 159 03/13/2021   NEUTROABS 2.8 03/13/2021    I discussed the assessment and treatment plan with the patient. The patient was provided an opportunity to ask questions and all were answered. The patient agreed with the  plan and demonstrated an understanding of the instructions. The patient was advised to call back or seek an in-person evaluation if the symptoms worsen or if the condition fails to improve as anticipated.    I spent 20 minutes for the appointment reviewing test results, discuss management and coordination of care.  Heath Lark, MD 03/22/2021 4:40 PM

## 2021-03-22 NOTE — Telephone Encounter (Signed)
Scheduled per 6/10 sch msg. Called and spoke with pt confirmed 12/7 and 12/16 appts

## 2021-03-22 NOTE — Assessment & Plan Note (Signed)
Her elevated liver enzymes are likely due to fatty liver change She is not symptomatic Observe only

## 2021-06-12 ENCOUNTER — Encounter (INDEPENDENT_AMBULATORY_CARE_PROVIDER_SITE_OTHER): Payer: Self-pay

## 2021-06-18 ENCOUNTER — Encounter (INDEPENDENT_AMBULATORY_CARE_PROVIDER_SITE_OTHER): Payer: Self-pay

## 2021-09-18 ENCOUNTER — Other Ambulatory Visit: Payer: Self-pay

## 2021-09-18 ENCOUNTER — Inpatient Hospital Stay: Payer: Commercial Managed Care - PPO | Attending: Hematology and Oncology

## 2021-09-18 DIAGNOSIS — D801 Nonfamilial hypogammaglobulinemia: Secondary | ICD-10-CM | POA: Diagnosis not present

## 2021-09-18 DIAGNOSIS — D591 Autoimmune hemolytic anemia, unspecified: Secondary | ICD-10-CM

## 2021-09-18 DIAGNOSIS — C8307 Small cell B-cell lymphoma, spleen: Secondary | ICD-10-CM

## 2021-09-18 LAB — COMPREHENSIVE METABOLIC PANEL
ALT: 39 U/L (ref 0–44)
AST: 18 U/L (ref 15–41)
Albumin: 3.9 g/dL (ref 3.5–5.0)
Alkaline Phosphatase: 79 U/L (ref 38–126)
Anion gap: 9 (ref 5–15)
BUN: 17 mg/dL (ref 6–20)
CO2: 26 mmol/L (ref 22–32)
Calcium: 8.4 mg/dL — ABNORMAL LOW (ref 8.9–10.3)
Chloride: 104 mmol/L (ref 98–111)
Creatinine, Ser: 0.86 mg/dL (ref 0.44–1.00)
GFR, Estimated: >60 mL/min (ref >60)
Glucose, Bld: 102 mg/dL — ABNORMAL HIGH (ref 70–99)
Potassium: 4.2 mmol/L (ref 3.5–5.1)
Sodium: 139 mmol/L (ref 135–145)
Total Bilirubin: 0.8 mg/dL (ref 0.3–1.2)
Total Protein: 6.1 g/dL — ABNORMAL LOW (ref 6.5–8.1)

## 2021-09-18 LAB — CBC WITH DIFFERENTIAL/PLATELET
Abs Immature Granulocytes: 0.01 10*3/uL (ref 0.00–0.07)
Basophils Absolute: 0 10*3/uL (ref 0.0–0.1)
Basophils Relative: 1 %
Eosinophils Absolute: 0.1 10*3/uL (ref 0.0–0.5)
Eosinophils Relative: 3 %
HCT: 35.8 % — ABNORMAL LOW (ref 36.0–46.0)
Hemoglobin: 12.2 g/dL (ref 12.0–15.0)
Immature Granulocytes: 0 %
Lymphocytes Relative: 20 %
Lymphs Abs: 0.8 10*3/uL (ref 0.7–4.0)
MCH: 30 pg (ref 26.0–34.0)
MCHC: 34.1 g/dL (ref 30.0–36.0)
MCV: 88 fL (ref 80.0–100.0)
Monocytes Absolute: 0.3 10*3/uL (ref 0.1–1.0)
Monocytes Relative: 7 %
Neutro Abs: 2.7 10*3/uL (ref 1.7–7.7)
Neutrophils Relative %: 69 %
Platelets: 151 10*3/uL (ref 150–400)
RBC: 4.07 MIL/uL (ref 3.87–5.11)
RDW: 13.2 % (ref 11.5–15.5)
WBC: 3.9 10*3/uL — ABNORMAL LOW (ref 4.0–10.5)
nRBC: 0 % (ref 0.0–0.2)

## 2021-09-19 LAB — IGG, IGA, IGM
IgA: 129 mg/dL (ref 87–352)
IgG (Immunoglobin G), Serum: 521 mg/dL — ABNORMAL LOW (ref 586–1602)
IgM (Immunoglobulin M), Srm: 29 mg/dL (ref 26–217)

## 2021-09-27 ENCOUNTER — Encounter: Payer: Self-pay | Admitting: Hematology and Oncology

## 2021-09-27 ENCOUNTER — Inpatient Hospital Stay (HOSPITAL_BASED_OUTPATIENT_CLINIC_OR_DEPARTMENT_OTHER): Payer: Commercial Managed Care - PPO | Admitting: Hematology and Oncology

## 2021-09-27 ENCOUNTER — Other Ambulatory Visit: Payer: Self-pay

## 2021-09-27 DIAGNOSIS — D801 Nonfamilial hypogammaglobulinemia: Secondary | ICD-10-CM | POA: Diagnosis not present

## 2021-09-27 DIAGNOSIS — C8307 Small cell B-cell lymphoma, spleen: Secondary | ICD-10-CM

## 2021-09-27 NOTE — Assessment & Plan Note (Signed)
She had history of acquired hypogammaglobulinemia secondary to treatment Recent repeat IgG is borderline low but she is not symptomatic We will continue to monitor closely in her next visit

## 2021-09-27 NOTE — Progress Notes (Signed)
HEMATOLOGY-ONCOLOGY ELECTRONIC VISIT PROGRESS NOTE  Patient Care Team: Sofie Hartigan, MD as PCP - General (Family Medicine) Heath Lark, MD as Consulting Physician (Hematology and Oncology)  I connected with the patient via telephone conference and verified that I am speaking with the correct person using two identifiers. The patient's location is at home and I am providing care from the [facility] I discussed the limitations, risks, security and privacy concerns of performing an evaluation and management service by e-visits and the availability of in person appointments.  I also discussed with the patient that there may be a patient responsible charge related to this service. The patient expressed understanding and agreed to proceed.   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[place]) She had history of acquired hypogammaglobulinemia secondary to treatment Recent repeat IgG is borderline low but she is not symptomatic We will continue to monitor closely in her next visit  Orders Placed This Encounter  Procedures   CBC with Differential ([place] Only)    Standing Status:   Future    Standing Expiration Date:   09/27/2022   CMP ([place] only)    Standing Status:   Future    Standing Expiration Date:   09/27/2022   IgG, IgA, IgM    Standing Status:   Future    Standing Expiration Date:   09/27/2022    INTERVAL HISTORY: Please see below for problem oriented charting. The purpose of today's discussion is to review test results The patient has history of recurrent lymphoma She is doing well She was not able to tolerate metformin but is able to watch her diet carefully She is going to proceed with carpal tunnel  surgery in the future No recent infection, fever or chills  SUMMARY OF ONCOLOGIC HISTORY: Oncology History  Lymphoma, marginal zone, spleen ([place])  03/23/2014 Bone Marrow Biopsy   Bone marrow is involved   04/19/2014 Imaging   PET CT scan showed lymphadenopathy and splenomegaly   04/27/2014 - 06/01/2014 Chemotherapy   She received weekly rituximab and cladribine X 6 cycles   05/21/2018 PET scan   1. 8 mm in short axis right level I a lymph node with Deauville 4 activity, increased from prior. 2. Small but mildly hypermetabolic left axillary lymph node with Deauville 4 activity. 3. Splenic activity only minimally greater than that of the liver, but with a moderate amount of splenomegaly (splenic volume 680 cubic cm). No focal splenic lesion observed. 4. Diffuse mildly increased activity in the skeleton without corresponding CT abnormality. Possibilities might include low-level marrow infiltration or granulocyte stimulation.   07/02/2018 - 11/26/2018 Chemotherapy   The patient had rituximab for chemotherapy treatment.     07/02/2018 - 11/26/2018 Chemotherapy   The patient had rituximab for chemotherapy treatment.     12/30/2018 Cancer Staging   Staging form: Hodgkin and Non-Hodgkin Lymphoma, AJCC 7th Edition - Clinical: S - Spleen, B - Symptoms - Signed by Heath Lark, MD on 12/30/2018    05/26/2019 Imaging   1. No findings in the chest, abdomen or pelvis to suggest recurrent disease. 2. No acute findings in the chest, abdomen or pelvis. 3. Mild atherosclerosis in the pelvic vasculature. 4. Additional incidental findings, as above.   01/19/2020 Imaging   1.  Focal consolidative opacity in the superior segment right lower lobe with patchy and nodular ground-glass and confluent airspace disease scattered throughout the right lower lobe and right middle lobe. Imaging features compatible with multifocal pneumonia. Follow-up CT chest may be warranted to ensure resolution. 2. Mild right hilar  lymphadenopathy, likely reactive.     04/20/2020 Imaging   1. Patchy ground-glass and airspace opacities medially in both lower lobes, improved on the right but new on the left. The consolidation in the superior segment right lower lobe has substantially reduced, although there is some mild continued airspace opacity in the superior segment right lower lobe. Appearance is nonspecific but could be from cryptogenic organizing pneumonia, aspiration pneumonitis, Wegener's granulomatosis, hypersensitivity pneumonitis, chronic eosinophilic pneumonia, or less likely a manifestation of pulmonary lymphoma. 2. Interval fracture of the lateral right seventh rib and anterolateral left seventh rib with early healing response. 3. Prior right hilar lymph node is difficult to measure due to the lack of IV contrast, but probably mildly reduced in size, and currently within normal limits.     REVIEW OF SYSTEMS:   Constitutional: Denies fevers, chills or abnormal weight loss Eyes: Denies blurriness of vision Ears, nose, mouth, throat, and face: Denies mucositis or sore throat Respiratory: Denies cough, dyspnea or wheezes Cardiovascular: Denies palpitation, chest discomfort Gastrointestinal:  Denies nausea, heartburn or change in bowel habits Skin: Denies abnormal skin rashes Lymphatics: Denies new lymphadenopathy or easy bruising Neurological:Denies numbness, tingling or new weaknesses Behavioral/Psych: Mood is stable, no new changes  Extremities: No lower extremity edema All other systems were reviewed with the patient and are negative.  I have reviewed the past medical history, past surgical history, social history and family history with the patient and they are unchanged from previous note.  ALLERGIES:  has No Known Allergies.  MEDICATIONS:  Current Outpatient Medications  Medication Sig Dispense Refill   acetaminophen (TYLENOL) 500 MG tablet Take 500-1,000 mg by mouth every 6 (six) hours as needed  (pain.).     ibuprofen (ADVIL,MOTRIN) 200 MG tablet Take 400-800 mg by mouth every 6 (six) hours as needed for headache, mild pain or moderate pain.      No current facility-administered medications for this visit.    PHYSICAL EXAMINATION: ECOG PERFORMANCE STATUS: 1 - Symptomatic but completely ambulatory  LABORATORY DATA:  I have reviewed the data as listed CMP Latest Ref Rng & Units 09/18/2021 03/13/2021 12/12/2020  Glucose 70 - 99 mg/dL 102(H) 114(H) 101(H)  BUN 6 - 20 mg/dL '17 11 18  ' Creatinine 0.44 - 1.00 mg/dL 0.86 0.84 0.94  Sodium 135 - 145 mmol/L 139 141 138  Potassium 3.5 - 5.1 mmol/L 4.2 4.0 4.2  Chloride 98 - 111 mmol/L 104 103 103  CO2 22 - 32 mmol/L '26 26 27  ' Calcium 8.9 - 10.3 mg/dL 8.4(L) 9.3 9.1  Total Protein 6.5 - 8.1 g/dL 6.1(L) 6.5 6.6  Total Bilirubin 0.3 - 1.2 mg/dL 0.8 1.1 0.9  Alkaline Phos 38 - 126 U/L 79 73 68  AST 15 - 41 U/L '18 24 21  ' ALT 0 - 44 U/L 39 57(H) 47(H)    Lab Results  Component Value Date   WBC 3.9 (L) 09/18/2021   HGB 12.2 09/18/2021   HCT 35.8 (L) 09/18/2021   MCV 88.0 09/18/2021   PLT 151 09/18/2021   NEUTROABS 2.7 09/18/2021     I discussed the assessment and treatment plan with the patient. The patient was provided an opportunity to ask questions and all were  answered. The patient agreed with the plan and demonstrated an understanding of the instructions. The patient was advised to call back or seek an in-person evaluation if the symptoms worsen or if the condition fails to improve as anticipated.    I spent 20 minutes for the appointment reviewing test results, discuss management and coordination of care.  Heath Lark, MD 09/27/2021 5:34 PM

## 2021-09-27 NOTE — Assessment & Plan Note (Signed)
She has no clinical signs or symptoms to suggest cancer recurrence I recommend close observation every 6 months to a year The patient is educated to watch out for signs and symptoms of cancer recurrence

## 2021-10-10 NOTE — Discharge Instructions (Addendum)
Instructions after Hand / Wrist Surgery   James P. Hooten, [name], M.D.  Dept. of Orthopaedics & Sports Medicine  [facility]  [address]  [place], [place]  [address]   Phone: [phone]   Fax: [phone]   DIET: Drink plenty of non-alcoholic fluids & begin a light diet. Resume your normal diet the day after surgery.  ACTIVITY:  Keep the hand elevated above the level of the elbow. Begin gently moving the fingers on a regular basis to avoid stiffness. Avoid any heavy lifting, pushing, or pulling with the operative hand. Do not drive or operate any equipment until instructed.  WOUND CARE:  Keep the splint/bandage clean and dry.  The splint and stitches will be removed in the office. Continue to use the ice packs periodically to reduce pain and swelling. You may bathe or shower after the stitches are removed at the first office visit following surgery.  MEDICATIONS: You may resume your regular medications. Please take the pain medication as prescribed. Do not take pain medication on an empty stomach. Do not drive or drink alcoholic beverages when taking pain medications.  CALL THE OFFICE FOR: Temperature above 101 degrees Excessive bleeding or drainage on the dressing. Excessive swelling, coldness, or paleness of the fingers. Persistent nausea and vomiting.  FOLLOW-UP:  You should have an appointment to return to the office in 7-10 days after surgery.   REMEMBER: R.I.C.E. = Rest, Ice, Compression, Elevation !     [facility] Department Directory         [email or web address]       [email or web address]          Cardiology  Appointments: [place] - [phone] [place] - [phone]  Endocrinology  Appointments: [place] - [phone] [place] - [phone]  Gastroenterology  Appointments: [place] - [phone] [place] - [phone]        General Surgery   Appointments: [place] -  [phone]  Internal Medicine/Family Medicine  Appointments: [place] - [phone] [place] - [phone] [place] - [phone]  Metabolic and Weigh Loss Surgery  Appointments: [place] - [phone]        Neurology  Appointments: [place] - [phone] [place] - [phone]  Neurosurgery  Appointments: [place] - [phone]  Obstetrics & Gynecology  Appointments: [place] - [phone] [place] - [phone]        Pediatrics  Appointments: [place] - [phone] [place] - [phone]  Physiatry  Appointments: [place] -[phone]  Physical Therapy  Appointments: [place] - [phone] [place] - [phone]        Podiatry  Appointments: [place] - [phone] [place] - [phone]  Pulmonology  Appointments: [place] - [phone]  Rheumatology  Appointments: [place] - [phone]        [place] Location: [facility]  [address] [place], [place]  [address]  [place] Location: [facility] [address] [place], [place]  [address]  [place] Location: [facility] [address] [place], [place]  [address]   AMBULATORY SURGERY  DISCHARGE INSTRUCTIONS   The drugs that you were given will stay in your system until tomorrow so for the next 24 hours you should not:  Drive an automobile Make any legal decisions Drink any alcoholic beverage   You may resume regular meals tomorrow.  Today it is better to start with liquids and gradually work up to solid foods.  You may eat anything you prefer, but it is better to start with liquids, then soup and crackers, and gradually work up to solid foods.   Please notify your doctor immediately if you have any   unusual bleeding, trouble breathing, redness and pain at the surgery site, drainage, fever, or pain not relieved by medication.    Your post-operative visit with Dr.                                       is: Date:                        Time:    Please call  to schedule your post-operative visit.  Additional Instructions:  

## 2021-10-16 ENCOUNTER — Other Ambulatory Visit: Payer: Self-pay

## 2021-10-16 ENCOUNTER — Encounter
Admission: RE | Admit: 2021-10-16 | Discharge: 2021-10-16 | Disposition: A | Discharge status: Home / Self Care | Payer: Commercial Managed Care - PPO | Source: Ambulatory Visit | Attending: Orthopedic Surgery | Admitting: Orthopedic Surgery

## 2021-10-16 NOTE — Patient Instructions (Signed)
Your procedure is scheduled on: 10/23/21 Report to [facility]. To find out your arrival time please call [phone] between 1PM - 3PM on 10/22/21.  Remember: Instructions that are not followed completely may result in serious medical risk, up to and including death, or upon the discretion of your surgeon and anesthesiologist your surgery may need to be rescheduled.     _X__ 1. Do not eat food after midnight the night before your procedure.                 No gum chewing or hard candies. You may drink clear liquids up to 2 hours                 before you are scheduled to arrive for your surgery- DO not drink clear                 liquids within 2 hours of the start of your surgery.                 Clear Liquids include:  water, apple juice without pulp, clear carbohydrate                 drink such as Clearfast or Gatorade, Black Coffee NO CREAM OF MILK or Tea (Do not add                 anything to coffee or tea). Diabetics water only  __X__2.  On the morning of surgery brush your teeth with toothpaste and water, you                 may rinse your mouth with mouthwash if you wish.  Do not swallow any              toothpaste of mouthwash.     _X__ 3.  No Alcohol for 24 hours before or after surgery.   _X__ 4.  Do Not Smoke or use e-cigarettes For 24 Hours Prior to Your Surgery.                 Do not use any chewable tobacco products for at least 6 hours prior to                 surgery.  ____  5.  Bring all medications with you on the day of surgery if instructed.   __X__  6.  Notify your doctor if there is any change in your medical condition      (cold, fever, infections).     Do not wear jewelry, make-up, hairpins, clips or nail polish. Do not wear lotions, powders, or perfumes.  Do not shave body hair 48 hours prior to surgery. Men may shave face and neck. Do not bring valuables to the hospital.    [facility] is not  responsible for any belongings or valuables.  Contacts, dentures/partials or body piercings may not be worn into surgery. Bring a case for your contacts, glasses or hearing aids, a denture cup will be supplied. Leave your suitcase in the car. After surgery it may be brought to your room. For patients admitted to the hospital, discharge time is determined by your treatment team.   Patients discharged the day of surgery will not be allowed to drive home.   Please read over the following fact sheets that you were given:     __X__ Take these medicines the morning of surgery with A  SIP OF WATER:    1. none  2.   3.   4.  5.  6.  ____ Fleet Enema (as directed)   __X__ Use CHG Soap/SAGE wipes as directed May come by office pick up soap or you may use Dial "Gold" bar. Use for few days prior to surgery and morning of surgery.  ____ Use inhalers on the day of surgery  ____ Stop metformin/Janumet/Farxiga 2 days prior to surgery    ____ Take 1/2 of usual insulin dose the night before surgery. No insulin the morning          of surgery.   ____ Stop Blood Thinners Coumadin/Plavix/Xarelto/Pleta/Pradaxa/Eliquis/Effient/Aspirin  on   Or contact your Surgeon, Cardiologist or Medical Doctor regarding  ability to stop your blood thinners  __X__ Stop Anti-inflammatories 7 days before surgery such as Advil, Ibuprofen, Motrin,  BC or Goodies Powder, Naprosyn, Naproxen, Aleve, Aspirin   May take Tylenol if needed.  __X__ Stop all herbal supplements, fish oil or vitamin E until after surgery.    ____ Bring C-Pap to the hospital.

## 2021-10-20 NOTE — H&P (Signed)
ORTHOPAEDIC HISTORY & PHYSICAL Kara Perkins, [place] - 10/16/2021 8:45 AM EST Formatting of this note is different from the original. Images from the original note were not included. Chief Complaint: Chief Complaint  Patient presents with   Right Wrist - Pre-op Exam   Reason for Visit: The patient is a 51 y.o. right-hand dominant female who presents today for reevaluation of both hands. The patient is being set up for a right open carpal tunnel release to be done by Dr. Marry Guan on October 23, 2021. She has similar symptoms on the left wrist and is planning on doing this after her right wrist is complete. She reports an approximately 4-year history of numbness and tingling to both hands with the left hand somewhat more symptomatic. The numbness and paresthesias primarily affect the thumb, index, and long fingers bilaterally. He denies any specific trauma or aggravating event. She does an extensive amount of keyboarding at work. She previously tried to investigate the possibility of a [employer]er[employer]ell[employer] claim with [employer], but the claim was denied. She has noticed some decrease in her grip strength. She denies any neck pain. She denies any radiation of pain down the arms. She has not appreciated any significant improvement despite use of ibuprofen and wrist splints. The patient is not a diabetic.  Medications: Current Outpatient Medications  Medication Sig Dispense Refill   ibuprofen (ADVIL,MOTRIN) 400 MG tablet Take 400-800 mg by mouth once daily   cholecalciferol (VITAMIN D3) 2,000 unit tablet Take 2,000 Units by mouth once daily (Patient not taking: Reported on 10/16/2021)   fluticasone propionate (FLONASE) 50 mcg/actuation nasal spray Place 2 sprays into both nostrils once daily as needed for Allergies (Patient not taking: Reported on 10/16/2021)   No current facility-administered medications for this visit.   Allergies: No Known Allergies  Past Medical History: Past Medical History:   Diagnosis Date   Lymphoma, marginal zone, spleen (CMS-HCC)  2015   Spleen enlarged   Past Surgical History: Past Surgical History:  Procedure Laterality Date   CHOLECYSTECTOMY   Social History: Social History   Socioeconomic History   Marital status: Married  Spouse name: Lawrence   Number of children: 2   Years of education: 29  Occupational History   Occupation: Multimedia programmer  Tobacco Use   Smoking status: Never   Smokeless tobacco: Never  Scientific laboratory technician Use: Never used  Substance and Sexual Activity   Alcohol use: No  Alcohol/week: 0.0 standard drinks   Drug use: No   Sexual activity: Yes  Partners: Male   Family History: History reviewed. No pertinent family history.  Review of Systems: A comprehensive 14 point ROS was performed, reviewed, and the pertinent orthopaedic findings are documented in the HPI.  Exam BP (!) 160/105   Pulse 71   Ht 167.6 cm (5\' 6" )   Wt 98 kg (216 lb)   LMP 09/02/2015 (Exact Date)   BMI 34.86 kg/m   General:  Well-developed, well-nourished female seen in no acute distress.   Neck: Good range of motion. No tenderness to palpation. Spurling`s test and Lhermitte sign are negative.  Extremities:  Normal shoulder contour.  Good range of motion and stability of the shoulders, elbows, and wrists. Tinel`s test at the elbow is negative.    Right hand:  Tenderness: Negative Erythema: negative Swelling: negative Capillary Refill: normal Thenar atrophy: negative Intrinsic wasting: negative Grip strength: fair to good grip strength Pincer strength: fair to good pincer strength Tinel`s test: negative Phalen`s test: positive  Triggering: No gross triggering or locking of the digits Finkelstein`s test: negative Range of motion: Good range of motion of the digits   Left hand:  Tenderness: Negative Erythema: Negative Swelling: Negative Capillary Refill: Normal Thenar atrophy: negative Intrinsic wasting:  negative Grip strength: fair to good grip strength Pincer strength: fair to good pincer strength Tinel`s test: negative Phalen`s test: positive Triggering: No gross triggering or locking of the digits Finkelstein`s test: Negative Range of motion: Good range of motion of the digits  Neurologic:  Awake, alert , and oriented.  Sensory function is intact except for decreased discrimination to pinprick and light touch in a median nerve distribution. Motor strength is judged to be 5/5 except as noted above. No clonus or tremor.  Motor coordination is within normal limits.  Nerve conduction studies: EMG/nerve conduction studies performed by Dr. Gurney Maxin on 02/20/2021 were reviewed. There is electrical evidence of median sensorimotor peripheral neuropathy characteristic of carpal tunnel syndrome. Findings are consistent with chronic severe right and moderate to severe left involvement. There is no evidence of focal peripheral neuropathy involving either ulnar nerve.   Impression: Right carpal tunnel syndrome. Left carpal tunnel syndrome.  Obesity  Plan:  The findings were discussed in detail with the patient. She was provided with a copy of the EMG/nerve conduction studies. The patient was given informational material on carpal tunnel release. Conservative treatment options were reviewed with the patient. We discussed the risks and benefits of surgical intervention. The usual perioperative course was also discussed in detail. The patient expressed understanding of the risks and benefits of surgical intervention and will take this information under advisement. We discussed the possibility that the nerve damage could become irreparable. She is planning to investigate filing another claim with [employer]. The patient is being set up for a right open carpal tunnel release to be done on October 23, 2020 by Dr. Marry Guan.  Dr. Marry Guan spent a total of 30 minutes in both face-to-face and  non-face-to-face activities for this visit on the date of this encounter.  ACTIVITIES: As tolerated. WORK STATUS: As tolerated THERAPY: None MEDICATIONS: Requested Prescriptions   No prescriptions requested or ordered in this encounter   FOLLOW-UP: Return for Postoperative care.   This note will serve as the history and physical for surgery scheduled on October 23, 2021 for a right open carpal tunnel release to be done by Dr. Marry Guan.   JONATHAN T MUNDY PA-C, M.S.   Electronically signed by Kara Presto, PA at 10/16/2021 9:17 AM EST

## 2021-10-23 ENCOUNTER — Ambulatory Visit
Admission: RE | Admit: 2021-10-23 | Discharge: 2021-10-23 | Disposition: A | Discharge status: Home / Self Care | Payer: Commercial Managed Care - PPO | Attending: Orthopedic Surgery | Admitting: Orthopedic Surgery

## 2021-10-23 ENCOUNTER — Ambulatory Visit: Payer: Commercial Managed Care - PPO | Admitting: Certified Registered"

## 2021-10-23 ENCOUNTER — Encounter
Admission: RE | Disposition: A | Discharge status: Home / Self Care | Payer: Self-pay | Source: Home / Self Care | Attending: Orthopedic Surgery

## 2021-10-23 ENCOUNTER — Other Ambulatory Visit: Payer: Self-pay

## 2021-10-23 ENCOUNTER — Encounter: Payer: Self-pay | Admitting: Orthopedic Surgery

## 2021-10-23 DIAGNOSIS — E669 Obesity, unspecified: Secondary | ICD-10-CM | POA: Diagnosis not present

## 2021-10-23 DIAGNOSIS — Z6834 Body mass index (BMI) 34.0-34.9, adult: Secondary | ICD-10-CM | POA: Diagnosis not present

## 2021-10-23 DIAGNOSIS — R161 Splenomegaly, not elsewhere classified: Secondary | ICD-10-CM | POA: Insufficient documentation

## 2021-10-23 DIAGNOSIS — G5603 Carpal tunnel syndrome, bilateral upper limbs: Secondary | ICD-10-CM | POA: Insufficient documentation

## 2021-10-23 DIAGNOSIS — G5601 Carpal tunnel syndrome, right upper limb: Secondary | ICD-10-CM | POA: Diagnosis present

## 2021-10-23 DIAGNOSIS — Z9889 Other specified postprocedural states: Secondary | ICD-10-CM

## 2021-10-23 HISTORY — PX: CARPAL TUNNEL RELEASE: SHX101

## 2021-10-23 LAB — POCT PREGNANCY, URINE: Preg Test, Ur: NEGATIVE

## 2021-10-23 SURGERY — CARPAL TUNNEL RELEASE
Anesthesia: General | Site: Wrist | Laterality: Right

## 2021-10-23 MED ORDER — ACETAMINOPHEN 10 MG/ML IV SOLN
INTRAVENOUS | Status: DC | PRN
Start: 2021-10-23 — End: 2021-10-23
  Administered 2021-10-23: 1000 mg via INTRAVENOUS

## 2021-10-23 MED ORDER — ONDANSETRON HCL 4 MG/2ML IJ SOLN: 4.0000 mg | Freq: Once | INTRAMUSCULAR | Status: DC | PRN

## 2021-10-23 MED ORDER — FENTANYL CITRATE (PF) 100 MCG/2ML IJ SOLN
INTRAMUSCULAR | Status: AC
  Filled 2021-10-23: qty 2

## 2021-10-23 MED ORDER — FENTANYL CITRATE (PF) 100 MCG/2ML IJ SOLN: 25.0000 ug | INTRAMUSCULAR | Status: DC | PRN

## 2021-10-23 MED ORDER — MORPHINE SULFATE (PF) 2 MG/ML IV SOLN: 0.5000 mg | INTRAVENOUS | Status: DC | PRN

## 2021-10-23 MED ORDER — SODIUM CHLORIDE 0.9 % IR SOLN
Status: DC | PRN
  Administered 2021-10-23: 120 mL

## 2021-10-23 MED ORDER — DEXAMETHASONE SODIUM PHOSPHATE 10 MG/ML IJ SOLN
INTRAMUSCULAR | Status: AC
  Filled 2021-10-23: qty 1

## 2021-10-23 MED ORDER — PROPOFOL 10 MG/ML IV BOLUS
INTRAVENOUS | Status: AC
  Filled 2021-10-23: qty 20

## 2021-10-23 MED ORDER — CELECOXIB 200 MG PO CAPS: 400.0000 mg | ORAL_CAPSULE | Freq: Once | ORAL | Status: AC

## 2021-10-23 MED ORDER — BUPIVACAINE HCL (PF) 0.25 % IJ SOLN
INTRAMUSCULAR | Status: AC
  Filled 2021-10-23: qty 30

## 2021-10-23 MED ORDER — OXYCODONE HCL 5 MG/5ML PO SOLN: 5.0000 mg | Freq: Once | ORAL | Status: DC | PRN

## 2021-10-23 MED ORDER — METOCLOPRAMIDE HCL 5 MG/ML IJ SOLN
INTRAMUSCULAR | Status: AC
  Filled 2021-10-23: qty 2

## 2021-10-23 MED ORDER — ONDANSETRON HCL 4 MG PO TABS: 4.0000 mg | ORAL_TABLET | Freq: Four times a day (QID) | ORAL | Status: DC | PRN

## 2021-10-23 MED ORDER — HYDROCODONE-ACETAMINOPHEN 5-325 MG PO TABS: 1.0000 | ORAL_TABLET | ORAL | Status: DC | PRN

## 2021-10-23 MED ORDER — ONDANSETRON HCL 4 MG/2ML IJ SOLN
INTRAMUSCULAR | Status: DC | PRN
  Administered 2021-10-23: 4 mg via INTRAVENOUS

## 2021-10-23 MED ORDER — ONDANSETRON HCL 4 MG/2ML IJ SOLN
INTRAMUSCULAR | Status: AC
  Filled 2021-10-23: qty 2

## 2021-10-23 MED ORDER — MIDAZOLAM HCL 2 MG/2ML IJ SOLN
INTRAMUSCULAR | Status: DC | PRN
Start: 2021-10-23 — End: 2021-10-23
  Administered 2021-10-23: 2 mg via INTRAVENOUS

## 2021-10-23 MED ORDER — FAMOTIDINE 20 MG PO TABS
ORAL_TABLET | ORAL | Status: AC
  Administered 2021-10-23: 20 mg via ORAL
  Filled 2021-10-23: qty 1

## 2021-10-23 MED ORDER — ORAL CARE MOUTH RINSE: 15.0000 mL | Freq: Once | OROMUCOSAL | Status: AC

## 2021-10-23 MED ORDER — LACTATED RINGERS IV SOLN: INTRAVENOUS | Status: DC

## 2021-10-23 MED ORDER — LIDOCAINE HCL (PF) 2 % IJ SOLN
INTRAMUSCULAR | Status: AC
  Filled 2021-10-23: qty 5

## 2021-10-23 MED ORDER — CELECOXIB 200 MG PO CAPS
ORAL_CAPSULE | ORAL | Status: AC
  Administered 2021-10-23: 400 mg via ORAL
  Filled 2021-10-23: qty 2

## 2021-10-23 MED ORDER — ONDANSETRON HCL 4 MG/2ML IJ SOLN: 4.0000 mg | Freq: Four times a day (QID) | INTRAMUSCULAR | Status: DC | PRN

## 2021-10-23 MED ORDER — MIDAZOLAM HCL 2 MG/2ML IJ SOLN
INTRAMUSCULAR | Status: AC
  Filled 2021-10-23: qty 2

## 2021-10-23 MED ORDER — FENTANYL CITRATE (PF) 100 MCG/2ML IJ SOLN
INTRAMUSCULAR | Status: DC | PRN
  Administered 2021-10-23 (×4): 25 ug via INTRAVENOUS

## 2021-10-23 MED ORDER — ACETAMINOPHEN 10 MG/ML IV SOLN: 1000.0000 mg | Freq: Once | INTRAVENOUS | Status: DC | PRN

## 2021-10-23 MED ORDER — NEOMYCIN-POLYMYXIN B GU 40-200000 IR SOLN
Status: AC
  Filled 2021-10-23: qty 2

## 2021-10-23 MED ORDER — CHLORHEXIDINE GLUCONATE 0.12 % MT SOLN: 15.0000 mL | Freq: Once | OROMUCOSAL | Status: AC

## 2021-10-23 MED ORDER — ACETAMINOPHEN 10 MG/ML IV SOLN
INTRAVENOUS | Status: AC
  Filled 2021-10-23: qty 100

## 2021-10-23 MED ORDER — METOCLOPRAMIDE HCL 10 MG PO TABS: 5.0000 mg | ORAL_TABLET | Freq: Three times a day (TID) | ORAL | Status: DC | PRN

## 2021-10-23 MED ORDER — LIDOCAINE HCL (CARDIAC) PF 100 MG/5ML IV SOSY
PREFILLED_SYRINGE | INTRAVENOUS | Status: DC | PRN
  Administered 2021-10-23: 80 mg via INTRAVENOUS

## 2021-10-23 MED ORDER — HYDROCODONE-ACETAMINOPHEN 7.5-325 MG PO TABS: 1.0000 | ORAL_TABLET | ORAL | Status: DC | PRN

## 2021-10-23 MED ORDER — PROPOFOL 10 MG/ML IV BOLUS
INTRAVENOUS | Status: DC | PRN
  Administered 2021-10-23: 170 mg via INTRAVENOUS

## 2021-10-23 MED ORDER — OXYCODONE HCL 5 MG PO TABS: 5.0000 mg | ORAL_TABLET | Freq: Once | ORAL | Status: DC | PRN

## 2021-10-23 MED ORDER — FAMOTIDINE 20 MG PO TABS: 20.0000 mg | ORAL_TABLET | Freq: Once | ORAL | Status: AC

## 2021-10-23 MED ORDER — LACTATED RINGERS IV SOLN: INTRAVENOUS | Status: DC | PRN

## 2021-10-23 MED ORDER — HYDROCODONE-ACETAMINOPHEN 5-325 MG PO TABS: 1.0000 | ORAL_TABLET | ORAL | 0 refills | Status: DC | PRN

## 2021-10-23 MED ORDER — BUPIVACAINE HCL (PF) 0.25 % IJ SOLN
INTRAMUSCULAR | Status: DC | PRN
  Administered 2021-10-23: 10 mL

## 2021-10-23 MED ORDER — DEXAMETHASONE SODIUM PHOSPHATE 10 MG/ML IJ SOLN
INTRAMUSCULAR | Status: DC | PRN
  Administered 2021-10-23: 5 mg via INTRAVENOUS

## 2021-10-23 MED ORDER — METOCLOPRAMIDE HCL 5 MG/ML IJ SOLN
5.0000 mg | Freq: Three times a day (TID) | INTRAMUSCULAR | Status: DC | PRN
  Administered 2021-10-23: 10 mg via INTRAVENOUS

## 2021-10-23 MED ORDER — ACETAMINOPHEN 325 MG PO TABS: 325.0000 mg | ORAL_TABLET | Freq: Four times a day (QID) | ORAL | Status: DC | PRN

## 2021-10-23 MED ORDER — CHLORHEXIDINE GLUCONATE 0.12 % MT SOLN
OROMUCOSAL | Status: AC
  Administered 2021-10-23: 15 mL via OROMUCOSAL
  Filled 2021-10-23: qty 15

## 2021-10-23 SURGICAL SUPPLY — 29 items
BNDG CMPR STD VLCR NS LF 5.8X3 (GAUZE/BANDAGES/DRESSINGS) ×1
BNDG ELASTIC 3X5.8 VLCR NS LF (GAUZE/BANDAGES/DRESSINGS) ×2 IMPLANT
BNDG ELASTIC 3X5.8 VLCR STR LF (GAUZE/BANDAGES/DRESSINGS) ×1 IMPLANT
BNDG ESMARK 4X12 TAN STRL LF (GAUZE/BANDAGES/DRESSINGS) ×2 IMPLANT
CAST PADDING 3X4FT ST 30246 (SOFTGOODS) ×1
CUFF TOURN SGL QUICK 18X4 (TOURNIQUET CUFF) ×2 IMPLANT
DRSG DERMACEA 8X12 NADH (GAUZE/BANDAGES/DRESSINGS) ×2 IMPLANT
DURAPREP 26ML APPLICATOR (WOUND CARE) ×2 IMPLANT
ELECT CAUTERY BLADE 6.4 (BLADE) ×2 IMPLANT
ELECT REM PT RETURN 9FT ADLT (ELECTROSURGICAL) ×2
ELECTRODE REM PT RTRN 9FT ADLT (ELECTROSURGICAL) ×1 IMPLANT
GAUZE 4X4 16PLY ~~LOC~~+RFID DBL (SPONGE) ×2 IMPLANT
GAUZE SPONGE 4X4 12PLY STRL (GAUZE/BANDAGES/DRESSINGS) ×2 IMPLANT
GLOVE SURG ENC TEXT LTX SZ7.5 (GLOVE) ×2 IMPLANT
GLOVE SURG UNDER LTX SZ8 (GLOVE) ×2 IMPLANT
GOWN STRL REUS W/ TWL LRG LVL3 (GOWN DISPOSABLE) ×2 IMPLANT
GOWN STRL REUS W/TWL LRG LVL3 (GOWN DISPOSABLE) ×4
KIT TURNOVER KIT A (KITS) ×2 IMPLANT
MANIFOLD NEPTUNE II (INSTRUMENTS) ×2 IMPLANT
NS IRRIG 500ML POUR BTL (IV SOLUTION) ×2 IMPLANT
PACK EXTREMITY ARMC (MISCELLANEOUS) ×2 IMPLANT
PAD CAST CTTN 3X4 STRL (SOFTGOODS) ×1 IMPLANT
PADDING CAST COTTON 3X4 STRL (SOFTGOODS) ×1
SOL PREP PVP 2OZ (MISCELLANEOUS) ×2
SOLUTION PREP PVP 2OZ (MISCELLANEOUS) ×1 IMPLANT
SPLINT CAST 1 STEP 3X12 (MISCELLANEOUS) ×2 IMPLANT
STOCKINETTE 48X4 2 PLY STRL (GAUZE/BANDAGES/DRESSINGS) ×1 IMPLANT
STOCKINETTE STRL 4IN 9604848 (GAUZE/BANDAGES/DRESSINGS) ×2 IMPLANT
SUT ETHILON 5-0 FS-2 18 BLK (SUTURE) ×2 IMPLANT

## 2021-10-23 NOTE — Anesthesia Preprocedure Evaluation (Signed)
Anesthesia Evaluation  Patient identified by MRN, date of birth, ID band Patient awake    Reviewed: Allergy & Precautions, NPO status , Patient's Chart, lab work & pertinent test results  History of Anesthesia Complications Negative for: history of anesthetic complications  Airway Mallampati: III  TM Distance: >3 FB Neck ROM: Full    Dental no notable dental hx. (+) Teeth Intact   Pulmonary neg pulmonary ROS, neg sleep apnea, neg COPD, Patient abstained from smoking.Not current smoker,    Pulmonary exam normal breath sounds clear to auscultation       Cardiovascular Exercise Tolerance: Good METS(-) hypertension(-) CAD and (-) Past MI negative cardio ROS  (-) dysrhythmias  Rhythm:Regular Rate:Normal - Systolic murmurs    Neuro/Psych negative neurological ROS  negative psych ROS   GI/Hepatic neg GERD  ,(+)     (-) substance abuse  ,   Endo/Other  neg diabetes  Renal/GU negative Renal ROS     Musculoskeletal   Abdominal   Peds  Hematology  (+) anemia ,   Anesthesia Other Findings Past Medical History: 05/11/2014: Constipation 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 11/05/2011: Iron deficiency anemia 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, marginal zone, spleen ([place]) 07/26/2013: Perimenopausal menorrhagia No date: Positive Coombs test 11/05/2011: Positive Coombs test  Reproductive/Obstetrics                             Anesthesia Physical Anesthesia Plan  ASA: 2  Anesthesia Plan: General   Post-op Pain Management: Celebrex PO (pre-op) and Ofirmev IV (intra-op)   Induction: Intravenous  PONV Risk Score and Plan: 3 and Propofol infusion, TIVA, Ondansetron, Midazolam and Dexamethasone  Airway Management Planned: LMA  Additional Equipment: None  Intra-op Plan:   Post-operative Plan: Extubation in OR  Informed Consent: I have reviewed the patients History and  Physical, chart, labs and discussed the procedure including the risks, benefits and alternatives for the proposed anesthesia with the patient or authorized representative who has indicated his/her understanding and acceptance.     Dental advisory given  Plan Discussed with: CRNA and Surgeon  Anesthesia Plan Comments: (Discussed risks of anesthesia with patient, including PONV, sore throat, lip/dental/eye damage. Rare risks discussed as well, such as cardiorespiratory and neurological sequelae, and allergic reactions. Discussed the role of CRNA in patient's perioperative care. Patient understands.)        Anesthesia Quick Evaluation

## 2021-10-23 NOTE — H&P (Signed)
The patient has been re-examined, and the chart reviewed, and there have been no interval changes to the documented history and physical.    The risks, benefits, and alternatives have been discussed at length. The patient expressed understanding of the risks benefits and agreed with plans for surgical intervention.  [name], [name] M.D.    

## 2021-10-23 NOTE — Op Note (Signed)
OPERATIVE NOTE  DATE OF SURGERY:  10/23/2021  PATIENT NAME:  Kara Perkins   DOB: 1971-10-08  MRN: [id number]  PRE-OPERATIVE DIAGNOSIS: Right carpal tunnel syndrome  POST-OPERATIVE DIAGNOSIS:  Same  PROCEDURE:  Right carpal tunnel release  SURGEON:  Marciano Sequin. M.D.  ANESTHESIA: general  ESTIMATED BLOOD LOSS: Minimal  FLUIDS REPLACED: 500 mL of crystalloid  TOURNIQUET TIME: 33 minutes  DRAINS: None  INDICATIONS FOR SURGERY: Kara Perkins is a 51 y.o. year old female with a long history of numbness and paresthesias to the right hand. EMG/nerve conduction studies demonstrated findings consistent with carpal tunnel syndrome.The patient had not seen any significant improvement despite conservative nonsurgical intervention. After discussion of the risks and benefits of surgical intervention, the patient expressed understanding of the risks benefits and agree with plans for carpal tunnel release.   PROCEDURE IN DETAIL: The patient was brought into the operating room and after adequate general anesthesia, a tourniquet was placed on the patient's right upper arm.The right hand and arm were prepped with alcohol and Duraprep and draped in the usual sterile fashion. A "time-out" was performed as per usual protocol. The hand and forearm were exsanguinated using an Esmarch and the tourniquet was inflated to 250 mmHg. Loupe magnification was used throughout the procedure. An incision was made just ulnar to the thenar palmar crease. Dissection was carried down through the palmar fascia to the transverse carpal ligament. The transverse carpal ligament was sharply incised, taking care to protect the underlying structures with the carpal tunnel. Complete release of the transverse carpal ligament was achieved. There was no evidence of ganglion cyst or lipoma within the carpal tunnel. The wound was irrigated with copious amounts of normal saline with antibiotic solution. The skin was then  re-approximated with interrupted sutures of #5-0 nylon. A sterile dressing was applied followed by application of a volar splint. The tourniquet was deflated with a total tourniquet time of 33 minutes.  The patient tolerated the procedure well and was transported to the PACU in stable condition.  Brandace Cargle P. Holley Bouche., M.D.

## 2021-10-23 NOTE — Transfer of Care (Signed)
Immediate Anesthesia Transfer of Care Note  Patient: Kara Perkins  Procedure(s) Performed: CARPAL TUNNEL RELEASE (Right: Wrist)  Patient Location: PACU  Anesthesia Type:General  Level of Consciousness: drowsy and patient cooperative  Airway & Oxygen Therapy: Patient Spontanous Breathing and Patient connected to face mask oxygen  Post-op Assessment: Report given to RN and Post -op Vital signs reviewed and stable  Post vital signs: Reviewed and stable  Last Vitals:  Vitals Value Taken Time  BP 126/79 10/23/21 1811  Temp    Pulse 64 10/23/21 1813  Resp 19 10/23/21 1813  SpO2 95 % 10/23/21 1813  Vitals shown include unvalidated device data.  Last Pain:  Vitals:   10/23/21 1452  TempSrc: Oral  PainSc: 2          Complications: No notable events documented.

## 2021-10-23 NOTE — Anesthesia Postprocedure Evaluation (Signed)
Anesthesia Post Note  Patient: Kara Perkins  Procedure(s) Performed: CARPAL TUNNEL RELEASE (Right: Wrist)  Patient location during evaluation: PACU Anesthesia Type: General Level of consciousness: awake and alert Pain management: pain level controlled Vital Signs Assessment: post-procedure vital signs reviewed and stable Respiratory status: spontaneous breathing, nonlabored ventilation, respiratory function stable and patient connected to nasal cannula oxygen Cardiovascular status: blood pressure returned to baseline and stable Postop Assessment: no apparent nausea or vomiting Anesthetic complications: no   No notable events documented.   Last Vitals:  Vitals:   10/23/21 1845 10/23/21 1903  BP: 126/63 (!) 147/65  Pulse: 69 72  Resp: (!) 22 18  Temp: 36.7 C   SpO2: 96% 99%    Last Pain:  Vitals:   10/23/21 1903  TempSrc:   PainSc: 0-No pain                 Arita Miss

## 2021-10-23 NOTE — Anesthesia Procedure Notes (Signed)
Procedure Name: LMA Insertion Date/Time: 10/23/2021 5:11 PM Performed by: Jerrye Noble, CRNA Pre-anesthesia Checklist: Patient identified, Emergency Drugs available, Suction available and Patient being monitored Patient Re-evaluated:Patient Re-evaluated prior to induction Oxygen Delivery Method: Circle system utilized Preoxygenation: Pre-oxygenation with 100% oxygen Induction Type: IV induction Number of attempts: 1 Placement Confirmation: positive ETCO2 and breath sounds checked- equal and bilateral Tube secured with: Tape Dental Injury: Teeth and Oropharynx as per pre-operative assessment

## 2021-10-24 ENCOUNTER — Encounter: Payer: Self-pay | Admitting: Orthopedic Surgery

## 2021-12-09 ENCOUNTER — Other Ambulatory Visit
Admission: RE | Admit: 2021-12-09 | Discharge: 2021-12-09 | Disposition: A | Discharge status: Home / Self Care | Payer: Commercial Managed Care - PPO | Source: Ambulatory Visit | Attending: Orthopedic Surgery | Admitting: Orthopedic Surgery

## 2021-12-09 ENCOUNTER — Other Ambulatory Visit: Payer: Self-pay

## 2021-12-09 NOTE — Anesthesia Preprocedure Evaluation (Addendum)
Anesthesia Evaluation  Patient identified by MRN, date of birth, ID band Patient awake    Reviewed: Allergy & Precautions, NPO status , Patient's Chart, lab work & pertinent test results  History of Anesthesia Complications (+) PONV and history of anesthetic complications  Airway Mallampati: III  TM Distance: >3 FB Neck ROM: Full    Dental no notable dental hx. (+) Teeth Intact   Pulmonary neg pulmonary ROS, neg sleep apnea, neg COPD, Patient abstained from smoking.Not current smoker,    Pulmonary exam normal breath sounds clear to auscultation       Cardiovascular Exercise Tolerance: Good METS(-) hypertension(-) CAD and (-) Past MI negative cardio ROS  (-) dysrhythmias  Rhythm:Regular Rate:Normal - Systolic murmurs    Neuro/Psych  Neuromuscular disease (Left carpal tunnel syndrome) negative psych ROS   GI/Hepatic neg GERD  ,(+)     (-) substance abuse  ,   Endo/Other  neg diabetes  Renal/GU negative Renal ROS     Musculoskeletal   Abdominal (+) + obese,   Peds  Hematology negative hematology ROS (+)   Anesthesia Other Findings Past Medical History: 05/11/2014: Constipation 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 11/05/2011: Iron deficiency anemia 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, marginal zone, 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[place]) 07/26/2013: Perimenopausal menorrhagia No date: Positive Coombs test 11/05/2011: Positive Coombs test  Reproductive/Obstetrics                           Anesthesia Physical  Anesthesia Plan  ASA: 2  Anesthesia Plan: General   Post-op Pain Management: Celebrex PO (pre-op) and Tylenol PO (pre-op)*   Induction: Intravenous  PONV Risk Score and Plan: 3 and Propofol infusion, TIVA, Ondansetron, Midazolam and Dexamethasone  Airway Management Planned: LMA  Additional Equipment: None  Intra-op Plan:   Post-operative Plan: Extubation in OR  Informed Consent: I  have reviewed the patients History and Physical, chart, labs and discussed the procedure including the risks, benefits and alternatives for the proposed anesthesia with the patient or authorized representative who has indicated his/her understanding and acceptance.     Dental advisory given  Plan Discussed with: CRNA and Surgeon  Anesthesia Plan Comments: (Discussed risks of anesthesia with patient, including PONV, sore throat, lip/dental/eye damage. Rare risks discussed as well, such as cardiorespiratory and neurological sequelae, and allergic reactions. Discussed the role of CRNA in patient's perioperative care. Patient understands.)       Anesthesia Quick Evaluation

## 2021-12-09 NOTE — Patient Instructions (Addendum)
Your procedure is scheduled on: Wednesday March 1, Report to Day Surgery inside [facility] 2nd floor, stop by admissions desk before getting on elevator. To find out your arrival time please call [phone] between 1PM - 3PM on Tuesday December 10, 2021.  Remember: Instructions that are not followed completely may result in serious medical risk,  up to and including death, or upon the discretion of your surgeon and anesthesiologist your  surgery may need to be rescheduled.     _X__ 1. Do not eat food after midnight the night before your procedure.                 No chewing gum or hard candies. You may drink clear liquids up to 2 hours                 before you are scheduled to arrive for your surgery- DO not drink clear                 liquids within 2 hours of the start of your surgery.                 Clear Liquids include:  water, apple juice without pulp, clear Gatorade, G2 or                  Gatorade Zero (avoid Red/Purple/Blue), Black Coffee or Tea (Do not add                 anything to coffee or tea).  __X__2.   Complete the "Ensure Clear Pre-surgery Clear Carbohydrate Drink" provided to you, 2 hours before arrival. **If you are diabetic you will be provided with an alternative drink, Gatorade Zero or G2.  __X__3.  On the morning of surgery brush your teeth with toothpaste and water, you                may rinse your mouth with mouthwash if you wish.  Do not swallow any toothpaste or mouthwash.     _X__ 3.  No Alcohol for 24 hours before or after surgery.   _X__ 4.  Do Not Smoke or use e-cigarettes For 24 Hours Prior to Your Surgery.                 Do not use any chewable tobacco products for at least 6 hours prior to                 Surgery.  _X__  5.  Do not use any recreational drugs (marijuana, cocaine, heroin, ecstasy, MDMA or other)                For at least one week prior to your surgery.  Combination of these drugs with anesthesia                 May have life threatening results.  ____  6.  Bring all medications with you on the day of surgery if instructed.   __X__  7.  Notify your doctor if there is any change in your medical condition      (cold, fever, infections).     Do not wear jewelry, make-up, hairpins, clips or nail polish. Do not wear lotions, powders, or perfumes. You may wear deodorant. Do not shave 48 hours prior to surgery.  Do not bring valuables to the hospital.    [facility] is not responsible for any belongings or valuables.  Contacts, dentures or bridgework may not  be worn into surgery. Leave your suitcase in the car. After surgery it may be brought to your room. For patients admitted to the hospital, discharge time is determined by your treatment team.   Patients discharged the day of surgery will not be allowed to drive home.   Make arrangements for someone to be with you for the first 24 hours of your Same Day Discharge.   __X__ Take these medicines the morning of surgery with A SIP OF WATER:    1. None   2.   3.   4.  5.  6.  ____ Fleet Enema (as directed)   __X__ Use Dial Soap  as directed  ____ Use Benzoyl Peroxide Gel as instructed  ____ Use inhalers on the day of surgery  ____ Stop metformin 2 days prior to surgery    ____ Take 1/2 of usual insulin dose the night before surgery. No insulin the morning          of surgery.   ____ Call your PCP, cardiologist, or Pulmonologist if taking Coumadin/Plavix/aspirin and ask when to stop before your surgery.   __X__ One Week prior to surgery- Stop Anti-inflammatories such as Ibuprofen, Aleve, Advil, Motrin, meloxicam (MOBIC), diclofenac, etodolac, ketorolac, Toradol, Daypro, piroxicam, Goody's or BC powders. OK TO USE TYLENOL IF NEEDED   __X__ Stop supplements until after surgery.    ____ Bring C-Pap to the hospital.    If you have any questions regarding your pre-procedure instructions,  Please call Pre-admit Testing at  [phone]

## 2021-12-11 ENCOUNTER — Ambulatory Visit
Admission: RE | Admit: 2021-12-11 | Discharge: 2021-12-11 | Disposition: A | Discharge status: Home / Self Care | Payer: Commercial Managed Care - PPO | Attending: Orthopedic Surgery | Admitting: Orthopedic Surgery

## 2021-12-11 ENCOUNTER — Encounter: Payer: Self-pay | Admitting: Orthopedic Surgery

## 2021-12-11 ENCOUNTER — Ambulatory Visit: Payer: Commercial Managed Care - PPO | Admitting: Urgent Care

## 2021-12-11 ENCOUNTER — Encounter
Admission: RE | Disposition: A | Discharge status: Home / Self Care | Payer: Self-pay | Source: Home / Self Care | Attending: Orthopedic Surgery

## 2021-12-11 ENCOUNTER — Other Ambulatory Visit: Payer: Self-pay

## 2021-12-11 DIAGNOSIS — G5602 Carpal tunnel syndrome, left upper limb: Secondary | ICD-10-CM | POA: Insufficient documentation

## 2021-12-11 DIAGNOSIS — E669 Obesity, unspecified: Secondary | ICD-10-CM | POA: Insufficient documentation

## 2021-12-11 DIAGNOSIS — Z6833 Body mass index (BMI) 33.0-33.9, adult: Secondary | ICD-10-CM | POA: Diagnosis not present

## 2021-12-11 DIAGNOSIS — R112 Nausea with vomiting, unspecified: Secondary | ICD-10-CM

## 2021-12-11 DIAGNOSIS — Z9889 Other specified postprocedural states: Secondary | ICD-10-CM

## 2021-12-11 HISTORY — PX: CARPAL TUNNEL RELEASE: SHX101

## 2021-12-11 HISTORY — DX: Nausea with vomiting, unspecified: R11.2

## 2021-12-11 HISTORY — DX: Other specified postprocedural states: Z98.890

## 2021-12-11 LAB — POCT PREGNANCY, URINE: Preg Test, Ur: NEGATIVE

## 2021-12-11 SURGERY — CARPAL TUNNEL RELEASE
Anesthesia: General | Site: Wrist | Laterality: Left

## 2021-12-11 MED ORDER — CHLORHEXIDINE GLUCONATE 0.12 % MT SOLN
15.0000 mL | Freq: Once | OROMUCOSAL | Status: AC
Start: 2021-12-11 — End: 2021-12-11

## 2021-12-11 MED ORDER — DEXAMETHASONE SODIUM PHOSPHATE 10 MG/ML IJ SOLN
INTRAMUSCULAR | Status: DC | PRN
  Administered 2021-12-11: 10 mg via INTRAVENOUS

## 2021-12-11 MED ORDER — ORAL CARE MOUTH RINSE: 15.0000 mL | Freq: Once | OROMUCOSAL | Status: AC

## 2021-12-11 MED ORDER — FENTANYL CITRATE (PF) 100 MCG/2ML IJ SOLN
INTRAMUSCULAR | Status: AC
  Filled 2021-12-11: qty 2

## 2021-12-11 MED ORDER — CELECOXIB 200 MG PO CAPS
ORAL_CAPSULE | ORAL | Status: AC
  Administered 2021-12-11: 400 mg via ORAL
  Filled 2021-12-11: qty 2

## 2021-12-11 MED ORDER — OXYCODONE HCL 5 MG/5ML PO SOLN: 5.0000 mg | Freq: Once | ORAL | Status: AC | PRN

## 2021-12-11 MED ORDER — FENTANYL CITRATE (PF) 100 MCG/2ML IJ SOLN
INTRAMUSCULAR | Status: AC
Start: 2021-12-11 — End: ?
  Filled 2021-12-11: qty 2

## 2021-12-11 MED ORDER — NEOMYCIN-POLYMYXIN B GU 40-200000 IR SOLN: Status: DC | PRN

## 2021-12-11 MED ORDER — ACETAMINOPHEN 10 MG/ML IV SOLN: 1000.0000 mg | Freq: Once | INTRAVENOUS | Status: DC | PRN

## 2021-12-11 MED ORDER — CELECOXIB 200 MG PO CAPS: 400.0000 mg | ORAL_CAPSULE | Freq: Once | ORAL | Status: AC

## 2021-12-11 MED ORDER — MIDAZOLAM HCL 2 MG/2ML IJ SOLN
INTRAMUSCULAR | Status: DC | PRN
  Administered 2021-12-11 (×2): 1 mg via INTRAVENOUS

## 2021-12-11 MED ORDER — BUPIVACAINE HCL (PF) 0.25 % IJ SOLN
INTRAMUSCULAR | Status: DC | PRN
  Administered 2021-12-11: 10 mL

## 2021-12-11 MED ORDER — FAMOTIDINE 20 MG PO TABS: 20.0000 mg | ORAL_TABLET | Freq: Once | ORAL | Status: AC

## 2021-12-11 MED ORDER — APREPITANT 40 MG PO CAPS
ORAL_CAPSULE | ORAL | Status: AC
  Filled 2021-12-11: qty 1

## 2021-12-11 MED ORDER — FENTANYL CITRATE (PF) 100 MCG/2ML IJ SOLN
INTRAMUSCULAR | Status: DC | PRN
  Administered 2021-12-11: 50 ug via INTRAVENOUS

## 2021-12-11 MED ORDER — LIDOCAINE HCL (CARDIAC) PF 100 MG/5ML IV SOSY
PREFILLED_SYRINGE | INTRAVENOUS | Status: DC | PRN
Start: 2021-12-11 — End: 2021-12-11
  Administered 2021-12-11: 100 mg via INTRAVENOUS

## 2021-12-11 MED ORDER — PROPOFOL 10 MG/ML IV BOLUS
INTRAVENOUS | Status: AC
  Filled 2021-12-11: qty 20

## 2021-12-11 MED ORDER — ONDANSETRON HCL 4 MG/2ML IJ SOLN
INTRAMUSCULAR | Status: DC | PRN
  Administered 2021-12-11: 4 mg via INTRAVENOUS

## 2021-12-11 MED ORDER — OXYCODONE HCL 5 MG PO TABS
ORAL_TABLET | ORAL | Status: AC
  Filled 2021-12-11: qty 1

## 2021-12-11 MED ORDER — ACETAMINOPHEN 500 MG PO TABS
ORAL_TABLET | ORAL | Status: AC
  Administered 2021-12-11: 1000 mg via ORAL
  Filled 2021-12-11: qty 2

## 2021-12-11 MED ORDER — FENTANYL CITRATE (PF) 100 MCG/2ML IJ SOLN
25.0000 ug | INTRAMUSCULAR | Status: DC | PRN
  Administered 2021-12-11 (×3): 50 ug via INTRAVENOUS

## 2021-12-11 MED ORDER — ONDANSETRON HCL 4 MG/2ML IJ SOLN
INTRAMUSCULAR | Status: AC
  Filled 2021-12-11: qty 2

## 2021-12-11 MED ORDER — FAMOTIDINE 20 MG PO TABS
ORAL_TABLET | ORAL | Status: AC
  Administered 2021-12-11: 20 mg via ORAL
  Filled 2021-12-11: qty 1

## 2021-12-11 MED ORDER — PROPOFOL 10 MG/ML IV BOLUS
INTRAVENOUS | Status: DC | PRN
  Administered 2021-12-11: 150 mg via INTRAVENOUS

## 2021-12-11 MED ORDER — NEOMYCIN-POLYMYXIN B GU 40-200000 IR SOLN
Status: AC
  Filled 2021-12-11: qty 4

## 2021-12-11 MED ORDER — CHLORHEXIDINE GLUCONATE 0.12 % MT SOLN
OROMUCOSAL | Status: AC
  Administered 2021-12-11: 15 mL via OROMUCOSAL
  Filled 2021-12-11: qty 15

## 2021-12-11 MED ORDER — APREPITANT 40 MG PO CAPS
40.0000 mg | ORAL_CAPSULE | Freq: Once | ORAL | Status: AC
  Administered 2021-12-11: 40 mg via ORAL

## 2021-12-11 MED ORDER — DEXAMETHASONE SODIUM PHOSPHATE 10 MG/ML IJ SOLN
INTRAMUSCULAR | Status: AC
  Filled 2021-12-11: qty 1

## 2021-12-11 MED ORDER — MIDAZOLAM HCL 2 MG/2ML IJ SOLN
INTRAMUSCULAR | Status: AC
  Filled 2021-12-11: qty 2

## 2021-12-11 MED ORDER — BUPIVACAINE HCL (PF) 0.25 % IJ SOLN
INTRAMUSCULAR | Status: AC
  Filled 2021-12-11: qty 60

## 2021-12-11 MED ORDER — ACETAMINOPHEN 500 MG PO TABS: 1000.0000 mg | ORAL_TABLET | Freq: Once | ORAL | Status: AC

## 2021-12-11 MED ORDER — PROPOFOL 500 MG/50ML IV EMUL
INTRAVENOUS | Status: DC | PRN
  Administered 2021-12-11: 175 ug/kg/min via INTRAVENOUS

## 2021-12-11 MED ORDER — LIDOCAINE HCL (PF) 2 % IJ SOLN
INTRAMUSCULAR | Status: AC
  Filled 2021-12-11: qty 5

## 2021-12-11 MED ORDER — PROPOFOL 1000 MG/100ML IV EMUL
INTRAVENOUS | Status: AC
  Filled 2021-12-11: qty 100

## 2021-12-11 MED ORDER — LACTATED RINGERS IV SOLN: INTRAVENOUS | Status: DC

## 2021-12-11 MED ORDER — DROPERIDOL 2.5 MG/ML IJ SOLN
0.6250 mg | Freq: Once | INTRAMUSCULAR | Status: DC | PRN
  Filled 2021-12-11: qty 2

## 2021-12-11 MED ORDER — HYDROCODONE-ACETAMINOPHEN 5-325 MG PO TABS: 1.0000 | ORAL_TABLET | ORAL | 0 refills | Status: DC | PRN

## 2021-12-11 MED ORDER — OXYCODONE HCL 5 MG PO TABS
5.0000 mg | ORAL_TABLET | Freq: Once | ORAL | Status: AC | PRN
  Administered 2021-12-11: 5 mg via ORAL

## 2021-12-11 SURGICAL SUPPLY — 28 items
BNDG ELASTIC 3X5.8 VLCR NS LF (GAUZE/BANDAGES/DRESSINGS) ×2 IMPLANT
BNDG ESMARK 4X12 TAN STRL LF (GAUZE/BANDAGES/DRESSINGS) ×2 IMPLANT
CAST PADDING 3X4FT ST 30246 (SOFTGOODS) ×1
CUFF TOURN SGL QUICK 18X4 (TOURNIQUET CUFF) ×1 IMPLANT
DRAPE U-SHAPE 47X51 STRL (DRAPES) ×2 IMPLANT
DRSG DERMACEA 8X12 NADH (GAUZE/BANDAGES/DRESSINGS) ×2 IMPLANT
DURAPREP 26ML APPLICATOR (WOUND CARE) ×2 IMPLANT
ELECT CAUTERY BLADE 6.4 (BLADE) ×2 IMPLANT
ELECT REM PT RETURN 9FT ADLT (ELECTROSURGICAL) ×2
ELECTRODE REM PT RTRN 9FT ADLT (ELECTROSURGICAL) ×1 IMPLANT
GAUZE SPONGE 4X4 12PLY STRL (GAUZE/BANDAGES/DRESSINGS) ×2 IMPLANT
GLOVE SURG ENC TEXT LTX SZ7.5 (GLOVE) ×2 IMPLANT
GLOVE SURG UNDER LTX SZ8 (GLOVE) ×2 IMPLANT
GOWN STRL REUS W/ TWL LRG LVL3 (GOWN DISPOSABLE) ×2 IMPLANT
GOWN STRL REUS W/TWL LRG LVL3 (GOWN DISPOSABLE) ×4
KIT TURNOVER KIT A (KITS) ×2 IMPLANT
MANIFOLD NEPTUNE II (INSTRUMENTS) ×2 IMPLANT
NS IRRIG 500ML POUR BTL (IV SOLUTION) ×2 IMPLANT
PACK EXTREMITY ARMC (MISCELLANEOUS) ×2 IMPLANT
PAD CAST CTTN 3X4 STRL (SOFTGOODS) ×1 IMPLANT
PADDING CAST COTTON 3X4 STRL (SOFTGOODS) ×1
SOL PREP PVP 2OZ (MISCELLANEOUS) ×2
SOLUTION PREP PVP 2OZ (MISCELLANEOUS) ×1 IMPLANT
SPLINT CAST 1 STEP 3X12 (MISCELLANEOUS) ×2 IMPLANT
STOCKINETTE 48X4 2 PLY STRL (GAUZE/BANDAGES/DRESSINGS) ×1 IMPLANT
STOCKINETTE STRL 4IN 9604848 (GAUZE/BANDAGES/DRESSINGS) ×2 IMPLANT
SUT ETHILON 5-0 FS-2 18 BLK (SUTURE) ×2 IMPLANT
WATER STERILE IRR 500ML POUR (IV SOLUTION) ×2 IMPLANT

## 2021-12-11 NOTE — Op Note (Signed)
OPERATIVE NOTE ? ?DATE OF SURGERY:  12/11/2021 ? ?PATIENT NAME:  Kara Perkins   ?DOB: 08/13/1971  ?MRN: [id number] ? ?PRE-OPERATIVE DIAGNOSIS: Left carpal tunnel syndrome ? ?POST-OPERATIVE DIAGNOSIS:  Same ? ?PROCEDURE:  Left carpal tunnel release ? ?SURGEON:  Marciano Sequin. M.D. ? ?ANESTHESIA: general ? ?ESTIMATED BLOOD LOSS: Minimal ? ?FLUIDS REPLACED: 500 mL of crystalloid ? ?TOURNIQUET TIME: 34 minutes ? ?DRAINS: None ? ?INDICATIONS FOR SURGERY: [name] is a 51 y.o. year old female with a long history of numbness and paresthesias to the left hand. EMG/nerve conduction studies demonstrated findings consistent with carpal tunnel syndrome.The patient had not seen any significant improvement despite conservative nonsurgical intervention. After discussion of the risks and benefits of surgical intervention, the patient expressed understanding of the risks benefits and agree with plans for carpal tunnel release.  ? ?PROCEDURE IN DETAIL: The patient was brought into the operating room and after adequate general anesthesia, a tourniquet was placed on the patient's left upper arm.The left hand and arm were prepped with alcohol and Duraprep and draped in the usual sterile fashion. A "time-out" was performed as per usual protocol. The hand and forearm were exsanguinated using an Esmarch and the tourniquet was inflated to 250 mmHg. Loupe magnification was used throughout the procedure. An incision was made just ulnar to the thenar palmar crease. Dissection was carried down through the palmar fascia to the transverse carpal ligament. The transverse carpal ligament was sharply incised, taking care to protect the underlying structures with the carpal tunnel. Complete release of the transverse carpal ligament was achieved. There was no evidence of ganglion cyst or lipoma within the carpal tunnel. The wound was irrigated with copious amounts of normal saline with antibiotic solution. The skin was then  re-approximated with interrupted sutures of #5-0 nylon.  10 mL of 0.25% Marcaine was injected along the incision line.  A sterile dressing was applied followed by application of a volar splint. The tourniquet was deflated with a total tourniquet time of 34 minutes. ? ?The patient tolerated the procedure well and was transported to the PACU in stable condition. ? ?Afra Tricarico P. Holley Bouche., M.D. ? ?

## 2021-12-11 NOTE — Anesthesia Postprocedure Evaluation (Signed)
Anesthesia Post Note ? ?Patient: Kara Perkins ? ?Procedure(s) Performed: Left carpal tunnel release (Left: Wrist) ? ?Patient location during evaluation: PACU ?Anesthesia Type: General ?Level of consciousness: awake and alert ?Pain management: pain level controlled ?Vital Signs Assessment: post-procedure vital signs reviewed and stable ?Respiratory status: spontaneous breathing, nonlabored ventilation and respiratory function stable ?Cardiovascular status: blood pressure returned to baseline and stable ?Postop Assessment: no apparent nausea or vomiting ?Anesthetic complications: no ? ? ?No notable events documented. ? ? ?Last Vitals:  ?Vitals:  ? 12/11/21 1515 12/11/21 1521  ?BP: 121/74 121/74  ?Pulse: (!) 55 (!) 54  ?Resp: 10 10  ?Temp:  (!) 36.1 ?C  ?SpO2: 95% 97%  ?  ?Last Pain:  ?Vitals:  ? 12/11/21 1521  ?TempSrc:   ?PainSc: 4   ? ? ?  ?  ?  ?  ?  ?  ? ?[place] Ouch ? ? ? ? ?

## 2021-12-11 NOTE — Discharge Instructions (Signed)
?  Instructions after Hand / Wrist Surgery ? ? Gaston Dase P. Shadrach Bartunek, [name], M.D. ? Dept. of Orthopaedics & Sports Medicine ? [facility] ? [address] ? [place], [place]  [address] ? ? Phone: [phone]   Fax: [phone] ? ? ?DIET: ?Drink plenty of non-alcoholic fluids & begin a light diet. ?Resume your normal diet the day after surgery. ? ?ACTIVITY:  ?Keep the hand elevated above the level of the elbow. ?Begin gently moving the fingers on a regular basis to avoid stiffness. ?Avoid any heavy lifting, pushing, or pulling with the operative hand. ?Do not drive or operate any equipment until instructed. ? ?WOUND CARE:  ?Keep the splint/bandage clean and dry.  ?The splint and stitches will be removed in the office. ?Continue to use the ice packs periodically to reduce pain and swelling. ?You may bathe or shower after the stitches are removed at the first office visit following surgery. ? ?MEDICATIONS: ?You may resume your regular medications. ?Please take the pain medication as prescribed. ?Do not take pain medication on an empty stomach. ?Do not drive or drink alcoholic beverages when taking pain medications. ? ?CALL THE OFFICE FOR: ?Temperature above 101 degrees ?Excessive bleeding or drainage on the dressing. ?Excessive swelling, coldness, or paleness of the fingers. ?Persistent nausea and vomiting. ? ?FOLLOW-UP:  ?You should have an appointment to return to the office in 7-10 days after surgery.  ? ?REMEMBER: R.I.C.E. = Rest, Ice, Compression, Elevation !  ? ? ? ?[facility] ?Department Directory ?     ? ? ? ?[email or web address] ? ? ?  ? ? ?[email or web address] ?  ? ?      ?Cardiology ? ?Appointments: ?[place] - [phone] ?[place] - [phone]  Endocrinology ? ?Appointments: ?[place] - [phone] ?[place] - [phone]  Gastroenterology ? ?Appointments: ?[place] - [phone] ?[place] - [phone]  ?      ?General Surgery ? ? ?Appointments: ?[place] -  [phone]  Internal Medicine/Family Medicine ? ?Appointments: ?[place] - [phone] ?[place] - [phone] ?[place] - [phone]  Metabolic and Weigh Loss Surgery ? ?Appointments: ?[place] - [phone]  ?      ?Neurology ? ?Appointments: ?[place] - [phone] ?[place] - [phone]  Neurosurgery ? ?Appointments: ?[place] - [phone]  Obstetrics & Gynecology ? ?Appointments: ?[place] - [phone] ?[place] - [phone]  ?      ?Pediatrics ? ?Appointments: ?[place] - [phone] ?[place] - [phone]  Physiatry ? ?Appointments: ?[place] -[phone]  Physical Therapy ? ?Appointments: ?[place] - [phone] ?[place] - [phone]  ?      ?Podiatry ? ?Appointments: ?[place] - [phone] ?[place] - [phone]  Pulmonology ? ?Appointments: ?[place] - [phone]  Rheumatology ? ?Appointments: ?[place] - [phone]  ?      ? Location: ?[facility]  ?[address] ?, [place]  [address]  [place] Location: ?[facility] ?[address] ?[place], [place]  [address]  [place] Location: ?[facility] ?[address] ?[place], [place]  [address]  ?  ?

## 2021-12-11 NOTE — H&P (Signed)
The patient has been re-examined, and the chart reviewed, and there have been no interval changes to the documented history and physical.    The risks, benefits, and alternatives have been discussed at length. The patient expressed understanding of the risks benefits and agreed with plans for surgical intervention.  Jeanee Fabre P. Shepherd Finnan, [name] M.D.    

## 2021-12-11 NOTE — H&P (Signed)
ORTHOPAEDIC HISTORY & PHYSICAL ?Markos Theil, Florinda Marker., MD - 12/05/2021 9:45 AM EST ?Formatting of this note is different from the original. ?Images from the original note were not included. ?Chief Complaint: ?Chief Complaint  ?Patient presents with  ? Left Hand - Follow-up  ?H&P- Left CTR- 12/11/21  ? Post Operative Visit  ?Right CTR- 10/23/21  ? ?Reason for Visit: ?The patient is a 51 y.o. right-hand dominant female who presents today for reevaluation of both hands. She reports an approximately 4-year history of numbness and tingling to both hands with the left hand somewhat more symptomatic. The numbness and paresthesias primarily affect the thumb, index, and long fingers bilaterally. He denies any specific trauma or aggravating event. Previous EMG/nerve conduction studies (02/20/2021) performed by Dr. Melrose Nakayama demonstrated electrical evidence of median sensorimotor peripheral neuropathy characteristic of carpal tunnel syndrome. Findings are consistent with chronic moderate to severe left involvement. she has not appreciated any significant improvement despite use of ibuprofen and wrist splints. ? ?She is now 6 weeks status post right carpal tunnel release. She denies any problems with the surgical incision other than some mild localized numbness proximally. She reports complete resolution of the numbness and paresthesias to the right hand. She reports good grip strength and range of motion. ? ?Medications: ?Current Outpatient Medications  ?Medication Sig Dispense Refill  ? cholecalciferol (VITAMIN D3) 2,000 unit tablet Take 2,000 Units by mouth once daily  ? fluticasone propionate (FLONASE) 50 mcg/actuation nasal spray Place 2 sprays into both nostrils once daily as needed for Allergies  ? HYDROcodone-acetaminophen (NORCO) 5-325 mg tablet TAKE 1-2 TABLETS BY MOUTH EVERY 4 HOURS AS NEEDED FOR MODERATE PAIN  ? ibuprofen (ADVIL,MOTRIN) 400 MG tablet Take 400-800 mg by mouth once daily  ? ?No current  facility-administered medications for this visit.  ? ?Allergies: ?No Known Allergies ? ?Past Medical History: ?Past Medical History:  ?Diagnosis Date  ? 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 Spleen enlarged  ? ?Past Surgical History: ?Past Surgical History:  ?Procedure Laterality Date  ? Right carpal tunnel release 10/23/2021  ?Dr Marry Guan  ? CHOLECYSTECTOMY  ? ?Social History: ?Social History  ? ?Socioeconomic History  ? Marital status: Married  ?Spouse name: Purcell Nails  ? Number of children: 2  ? Years of education: 59  ?Occupational History  ? Occupation: Full-time-Customer Service  ?Tobacco Use  ? Smoking status: Never  ? Smokeless tobacco: Never  ?Vaping Use  ? Vaping Use: Never used  ?Substance and Sexual Activity  ? Alcohol use: No  ?Alcohol/week: 0.0 standard drinks  ? Drug use: No  ? Sexual activity: Yes  ?Partners: Male  ? ?Family History: ?History reviewed. No pertinent family history. ? ?Review of Systems: ?A comprehensive 14 point ROS was performed, reviewed, and the pertinent orthopaedic findings are documented in the HPI. ? ?Exam ?BP (!) 132/90  Ht 167.6 cm (5\' 6" )  Wt 96 kg (211 lb 9.6 oz)  LMP 09/02/2015 (Exact Date)  BMI 34.15 kg/m?  ? ?General:  ?Well-developed, well-nourished female seen in no acute distress.  ? ?Neck: ?Good range of motion. No tenderness to palpation. ?Spurling`s test and Lhermitte sign are negative. ? ?Lungs:  ?Clear to auscultation bilaterally. ? ?Cardiovascular:  ?Regular rate and rhythm. Normal S1, S2. No murmur . No appreciable gallops or rubs. Peripheral pulses are palpable.  ? ?Extremities:  ?Normal shoulder contour.  ?Good range of motion and stability of the shoulders, elbows, and wrists. ?Tinel`s test at the elbow is negative. ? ? Right hand:  ?  Tenderness: Minimal tenderness to the proximal surgical incision  ?Erythema: negative ?Swelling: negative ?Capillary Refill: normal ?Thenar atrophy: negative ?Intrinsic wasting: negative ?Grip strength: fair to  good grip strength ?Pincer strength: fair to good pincer strength ?Tinel`s test: Negative ?Phalen`s test: Negative ?Triggering: No gross triggering or locking of the digits ?Finkelstein`s test: negative ?Range of motion: Good range of motion of the digits ?  ?Left hand:  ?Tenderness: Negative ?Erythema: Negative ?Swelling: Negative ?Capillary Refill: Normal ?Thenar atrophy: negative ?Intrinsic wasting: negative ?Grip strength: fair to good grip strength ?Pincer strength: fair to good pincer strength ?Tinel`s test: Positive ?Phalen`s test: Positive ?Triggering: No gross triggering or locking of the digits ?Finkelstein`s test: Negative ?Range of motion: Good range of motion of the digits ? ?Neurologic:  ?Awake, alert , and oriented.  ?Sensory function is intact except for decreased discrimination to pinprick and light touch in a median nerve distribution. ?Motor strength is judged to be 5/5 except as noted above. ?No clonus or tremor.  ?Motor coordination is within normal limits. ? ?Impression: ?Left carpal tunnel syndrome ?Right carpal tunnel release ? ?Plan:  ?The findings were discussed in detail with the patient.  ?She is doing well with regards to the right wrist. She was instructed on wound care and desensitization techniques. ?Conservative treatment options for the left carpal tunnel syndrome were reviewed with the patient. We discussed the risks and benefits of surgical intervention. The usual perioperative course was also discussed in detail. The patient expressed understanding of the risks and benefits of surgical intervention and would like to proceed with plans for left carpal tunnel release. ? ?I spent a total of 40 minutes in both face-to-face and non-face-to-face activities for this visit on the date of this encounter. ? ?MEDICAL CLEARANCE: ?Per anesthesiology ?ACTIVITIES: ?As tolerated. ?WORK STATUS: ?As tolerated ?THERAPY: ?None ?MEDICATIONS: ?Requested Prescriptions  ? ?No prescriptions requested or  ordered in this encounter  ? ?FOLLOW-UP: ?Return for postoperative follow-up. ? ?Mashell Sieben P. Holley Bouche., M.D.  ?Electronically signed by Lamar Benes., MD at 12/07/2021 4:39 PM EST ? ?

## 2021-12-11 NOTE — Anesthesia Procedure Notes (Signed)
Procedure Name: LMA Insertion ?Date/Time: 12/18/2021 1:12 PM ?Performed by: Loletha Grayer, CRNA ?Pre-anesthesia Checklist: Patient identified, Patient being monitored, Timeout performed, Emergency Drugs available and Suction available ?Patient Re-evaluated:Patient Re-evaluated prior to induction ?Oxygen Delivery Method: Circle system utilized ?Preoxygenation: Pre-oxygenation with 100% oxygen ?Induction Type: IV induction ?Ventilation: Mask ventilation without difficulty ?LMA: LMA inserted ?LMA Size: 4.0 ?Number of attempts: 1 ?Placement Confirmation: positive ETCO2 and breath sounds checked- equal and bilateral ?Tube secured with: Tape ?Dental Injury: Teeth and Oropharynx as per pre-operative assessment  ? ? ? ? ?

## 2021-12-11 NOTE — Transfer of Care (Signed)
Immediate Anesthesia Transfer of Care Note ? ?Patient: Kara Perkins ? ?Procedure(s) Performed: Left carpal tunnel release (Left: Wrist) ? ?Patient Location: PACU ? ?Anesthesia Type:General ? ?Level of Consciousness: drowsy ? ?Airway & Oxygen Therapy: Patient Spontanous Breathing and Patient connected to face mask oxygen ? ?Post-op Assessment: Report given to RN and Post -op Vital signs reviewed and stable ? ?Post vital signs: Reviewed and stable ? ?Last Vitals:  ?Vitals Value Taken Time  ?BP 104/58 12/11/21 1416  ?Temp 36.3 ?C 12/11/21 1413  ?Pulse 69 12/11/21 1416  ?Resp 11 12/11/21 1416  ?SpO2 100 % 12/11/21 1416  ? ? ?Last Pain:  ?Vitals:  ? 12/11/21 1413  ?TempSrc:   ?PainSc: Asleep  ?   ? ?  ? ?Complications: No notable events documented. ?

## 2021-12-12 ENCOUNTER — Encounter: Payer: Self-pay | Admitting: Orthopedic Surgery

## 2022-01-27 ENCOUNTER — Telehealth: Payer: Self-pay

## 2022-01-27 ENCOUNTER — Other Ambulatory Visit: Payer: Self-pay | Admitting: Hematology and Oncology

## 2022-01-27 NOTE — Telephone Encounter (Signed)
Called back and rescheduled appts. She is aware of appt dates and times. ?

## 2022-01-27 NOTE — Telephone Encounter (Signed)
She called and left a message asking if June appts could be rescheduled. She had carpal tunnel surgery and is out on leave until 5/1. She is asking if she can come for labs this Friday and have a virtual visit next week? ?

## 2022-01-27 NOTE — Telephone Encounter (Signed)
I can do virtual visit next Thursday at 1245 ?Labs must be done at least 7 days prior to appt ?

## 2022-01-29 ENCOUNTER — Inpatient Hospital Stay: Payer: Commercial Managed Care - PPO | Attending: Hematology and Oncology

## 2022-01-29 ENCOUNTER — Other Ambulatory Visit: Payer: Self-pay

## 2022-01-29 DIAGNOSIS — D801 Nonfamilial hypogammaglobulinemia: Secondary | ICD-10-CM | POA: Diagnosis present

## 2022-01-29 DIAGNOSIS — C8307 Small cell B-cell lymphoma, spleen: Secondary | ICD-10-CM

## 2022-01-29 LAB — CBC WITH DIFFERENTIAL (CANCER CENTER ONLY)
Abs Immature Granulocytes: 0.01 10*3/uL (ref 0.00–0.07)
Basophils Absolute: 0 10*3/uL (ref 0.0–0.1)
Basophils Relative: 0 %
Eosinophils Absolute: 0.1 10*3/uL (ref 0.0–0.5)
Eosinophils Relative: 3 %
HCT: 38.6 % (ref 36.0–46.0)
Hemoglobin: 13.6 g/dL (ref 12.0–15.0)
Immature Granulocytes: 0 %
Lymphocytes Relative: 24 %
Lymphs Abs: 1.1 10*3/uL (ref 0.7–4.0)
MCH: 30.2 pg (ref 26.0–34.0)
MCHC: 35.2 g/dL (ref 30.0–36.0)
MCV: 85.6 fL (ref 80.0–100.0)
Monocytes Absolute: 0.3 10*3/uL (ref 0.1–1.0)
Monocytes Relative: 6 %
Neutro Abs: 3.1 10*3/uL (ref 1.7–7.7)
Neutrophils Relative %: 67 %
Platelet Count: 162 10*3/uL (ref 150–400)
RBC: 4.51 MIL/uL (ref 3.87–5.11)
RDW: 13.1 % (ref 11.5–15.5)
WBC Count: 4.7 10*3/uL (ref 4.0–10.5)
nRBC: 0 % (ref 0.0–0.2)

## 2022-01-29 LAB — CMP (CANCER CENTER ONLY)
ALT: 39 U/L (ref 0–44)
AST: 22 U/L (ref 15–41)
Albumin: 4.4 g/dL (ref 3.5–5.0)
Alkaline Phosphatase: 78 U/L (ref 38–126)
Anion gap: 6 (ref 5–15)
BUN: 16 mg/dL (ref 6–20)
CO2: 29 mmol/L (ref 22–32)
Calcium: 9.3 mg/dL (ref 8.9–10.3)
Chloride: 104 mmol/L (ref 98–111)
Creatinine: 0.98 mg/dL (ref 0.44–1.00)
GFR, Estimated: >60 mL/min (ref >60)
Glucose, Bld: 97 mg/dL (ref 70–99)
Potassium: 4.3 mmol/L (ref 3.5–5.1)
Sodium: 139 mmol/L (ref 135–145)
Total Bilirubin: 0.8 mg/dL (ref 0.3–1.2)
Total Protein: 6.7 g/dL (ref 6.5–8.1)

## 2022-01-30 LAB — IGG, IGA, IGM
IgA: 142 mg/dL (ref 87–352)
IgG (Immunoglobin G), Serum: 578 mg/dL — ABNORMAL LOW (ref 586–1602)
IgM (Immunoglobulin M), Srm: 40 mg/dL (ref 26–217)

## 2022-01-31 ENCOUNTER — Other Ambulatory Visit: Payer: Self-pay | Admitting: Obstetrics and Gynecology

## 2022-01-31 DIAGNOSIS — M79621 Pain in right upper arm: Secondary | ICD-10-CM

## 2022-01-31 DIAGNOSIS — M79622 Pain in left upper arm: Secondary | ICD-10-CM

## 2022-02-06 ENCOUNTER — Encounter: Payer: Self-pay | Admitting: Hematology and Oncology

## 2022-02-06 ENCOUNTER — Inpatient Hospital Stay (HOSPITAL_BASED_OUTPATIENT_CLINIC_OR_DEPARTMENT_OTHER): Payer: Commercial Managed Care - PPO | Admitting: Hematology and Oncology

## 2022-02-06 DIAGNOSIS — B029 Zoster without complications: Secondary | ICD-10-CM

## 2022-02-06 DIAGNOSIS — C8307 Small cell B-cell lymphoma, spleen: Secondary | ICD-10-CM | POA: Diagnosis not present

## 2022-02-06 DIAGNOSIS — D801 Nonfamilial hypogammaglobulinemia: Secondary | ICD-10-CM

## 2022-02-06 NOTE — Progress Notes (Signed)
? ?HEMATOLOGY-ONCOLOGY ELECTRONIC VISIT PROGRESS NOTE ? ?Patient Care Team: ?Sofie Hartigan, MD as PCP - General (Family Medicine) ?Heath Lark, MD as Consulting Physician (Hematology and Oncology) ? ?I connected with the patient via telephone conference and verified that I am speaking with the correct person using two identifiers. The patient's location is at home and I am providing care from the [facility] ?I discussed the limitations, risks, security and privacy concerns of performing an evaluation and management service by e-visits and the availability of in person appointments.  ?I also discussed with the patient that there may be a patient responsible charge related to this service. The patient expressed understanding and agreed to proceed.  ? ?ASSESSMENT & PLAN:  ?Lymphoma, marginal zone, spleen ([place]) ?Overall, she has no clinical signs of cancer recurrence ?I recommend repeat blood work and follow-up in 6 months ?She is in agreement ? 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 ?Zoster without complications ?She was diagnosed with shingles ?She is responding to Valtrex ?We discussed social distancing/precaution for the next few days to reduce risk of transmission to her pregnant daughter ? ?Orders Placed This Encounter  ?Procedures  ? CBC with Differential ([facility] Only)  ?  Standing Status:   Future  ?  Standing Expiration Date:   02/07/2023  ? CMP ([facility] only)  ?  Standing Status:   Future  ?  Standing Expiration Date:   02/07/2023  ? IgG, IgA, IgM  ?  Standing Status:   Future  ?  Standing Expiration Date:   02/07/2023  ? ? ?INTERVAL HISTORY: ?Please see below for problem oriented charting. ?The purpose of today's discussion is to review test results ?Since last time I saw her, she has undergone a lot of  stress ?She developed shingles near the right forehead/eyelid area but denies ocular involvement ?She was prescribed Valtrex and appears to be responding ?No other forms of infection ?No new lymphadenopathy ? ?SUMMARY OF ONCOLOGIC HISTORY: ?Oncology History  ?Lymphoma, marginal zone, spleen ([place])  ?03/23/2014 Bone Marrow Biopsy  ? Bone marrow is involved ? ?  ?04/19/2014 Imaging  ? PET CT scan showed lymphadenopathy and splenomegaly ? ?  ?04/27/2014 - 06/01/2014 Chemotherapy  ? She received weekly rituximab and cladribine X 6 cycles ? ?  ?05/21/2018 PET scan  ? 1. 8 mm in short axis right level I a lymph node with Deauville 4 activity, increased from prior. ?2. Small but mildly hypermetabolic left axillary lymph node with Deauville 4 activity. ?3. Splenic activity only minimally greater than that of the liver, but with a moderate amount of splenomegaly (splenic volume 680 cubic cm). No focal splenic lesion observed. ?4. Diffuse mildly increased activity in the skeleton without corresponding CT abnormality. Possibilities might include low-level marrow infiltration or granulocyte stimulation. ? ?  ?07/02/2018 - 11/26/2018 Chemotherapy  ? The patient had rituximab for chemotherapy treatment.  ? ? ?  ?07/02/2018 - 11/26/2018 Chemotherapy  ? The patient had rituximab for chemotherapy treatment.  ? ? ?  ?12/30/2018 Cancer Staging  ? Staging form: Hodgkin and Non-Hodgkin Lymphoma, AJCC 7th Edition ?- Clinical: S - Spleen, B - Symptoms - Signed by Heath Lark, MD on 12/30/2018 ? ?  ?05/26/2019 Imaging  ? 1. No findings in the chest, abdomen or pelvis to suggest recurrent disease. ?2. No acute findings in the chest, abdomen  or pelvis. ?3. Mild atherosclerosis in the pelvic vasculature. ?4. Additional incidental findings, as above. ?  ?01/19/2020 Imaging  ? 1. Focal consolidative opacity in the superior segment right lower lobe with patchy and nodular ground-glass and confluent airspace disease scattered throughout the right lower lobe and  right middle lobe. Imaging features compatible with multifocal pneumonia. Follow-up CT chest may be warranted to ensure resolution. ?2. Mild right hilar lymphadenopathy, likely reactive. ?  ?  ?04/20/2020 Imaging  ? 1. Patchy ground-glass and airspace opacities medially in both lower lobes, improved on the right but new on the left. The consolidation in the superior segment right lower lobe has substantially reduced, ?although there is some mild continued airspace opacity in the superior segment right lower lobe. Appearance is nonspecific but could be from cryptogenic organizing pneumonia, aspiration pneumonitis, Wegener's granulomatosis, hypersensitivity pneumonitis, chronic eosinophilic pneumonia, or less likely a manifestation of pulmonary lymphoma. ?2. Interval fracture of the lateral right seventh rib and anterolateral left seventh rib with early healing response. ?3. Prior right hilar lymph node is difficult to measure due to the lack of IV contrast, but probably mildly reduced in size, and currently within normal limits. ?  ? ? ?REVIEW OF SYSTEMS:   ?Constitutional: Denies fevers, chills or abnormal weight loss ?Eyes: Denies blurriness of vision ?Ears, nose, mouth, throat, and face: Denies mucositis or sore throat ?Respiratory: Denies cough, dyspnea or wheezes ?Cardiovascular: Denies palpitation, chest discomfort ?Gastrointestinal:  Denies nausea, heartburn or change in bowel habits ?Lymphatics: Denies new lymphadenopathy or easy bruising ?Neurological:Denies numbness, tingling or new weaknesses ?Behavioral/Psych: Mood is stable, no new changes  ?Extremities: No lower extremity edema ?All other systems were reviewed with the patient and are negative. ? ?I have reviewed the past medical history, past surgical history, social history and family history with the patient and they are unchanged from previous note. ? ?ALLERGIES:  is allergic to other. ? ?MEDICATIONS:  ?Current Outpatient Medications  ?Medication Sig  Dispense Refill  ? valACYclovir (VALTREX) 1000 MG tablet Take by mouth.    ? acetaminophen (TYLENOL) 500 MG tablet Take 1,000 mg by mouth every 6 (six) hours as needed (pain.).    ? ?No current facility-administered medications for this visit.  ? ? ?PHYSICAL EXAMINATION: ?ECOG PERFORMANCE STATUS: 1 - Symptomatic but completely ambulatory ? ?LABORATORY DATA:  ?I have reviewed the data as listed ? ?  Latest Ref Rng & Units 01/29/2022  ? 11:42 AM 09/18/2021  ?  8:50 AM 03/13/2021  ?  8:35 AM  ?CMP  ?Glucose 70 - 99 mg/dL 97   102   114    ?BUN 6 - 20 mg/dL '16   17   11    ' ?Creatinine 0.44 - 1.00 mg/dL 0.98   0.86   0.84    ?Sodium 135 - 145 mmol/L 139   139   141    ?Potassium 3.5 - 5.1 mmol/L 4.3   4.2   4.0    ?Chloride 98 - 111 mmol/L 104   104   103    ?CO2 22 - 32 mmol/L '29   26   26    ' ?Calcium 8.9 - 10.3 mg/dL 9.3   8.4   9.3    ?Total Protein 6.5 - 8.1 g/dL 6.7   6.1   6.5    ?Total Bilirubin 0.3 - 1.2 mg/dL 0.8   0.8   1.1    ?Alkaline Phos 38 - 126 U/L 78   79   73    ?  AST 15 - 41 U/L '22   18   24    ' ?ALT 0 - 44 U/L 39   39   57    ? ? ?Lab Results  ?Component Value Date  ? WBC 4.7 01/29/2022  ? HGB 13.6 01/29/2022  ? HCT 38.6 01/29/2022  ? MCV 85.6 01/29/2022  ? PLT 162 01/29/2022  ? NEUTROABS 3.1 01/29/2022  ? ? ?I discussed the assessment and treatment plan with the patient. The patient was provided an opportunity to ask questions and all were answered. The patient agreed with the plan and demonstrated an understanding of the instructions. The patient was advised to call back or seek an in-person evaluation if the symptoms worsen or if the condition fails to improve as anticipated.  ?  ?I spent 20 minutes for the appointment reviewing test results, discuss management and coordination of care. ? ?Heath Lark, MD ?02/06/2022 3:19 PM ? ?

## 2022-02-06 NOTE — Assessment & Plan Note (Signed)
Overall, she has no clinical signs of cancer recurrence I recommend repeat blood work and follow-up in 6 months She is in agreement 

## 2022-02-06 NOTE — Assessment & Plan Note (Signed)
She was diagnosed with shingles ?She is responding to Valtrex ?We discussed social distancing/precaution for the next few days to reduce risk of transmission to her pregnant daughter ?

## 2022-02-06 NOTE — Assessment & Plan Note (Signed)
She had history of acquired hypogammaglobulinemia secondary to treatment ?Recent repeat IgG is borderline low but overall improved ?We will continue to monitor closely in her next visit ?

## 2022-02-18 ENCOUNTER — Other Ambulatory Visit: Payer: Commercial Managed Care - PPO

## 2022-02-26 ENCOUNTER — Other Ambulatory Visit: Payer: Commercial Managed Care - PPO

## 2022-03-10 ENCOUNTER — Encounter: Payer: Self-pay | Admitting: Hematology and Oncology

## 2022-03-12 ENCOUNTER — Other Ambulatory Visit: Payer: Commercial Managed Care - PPO

## 2022-03-19 ENCOUNTER — Other Ambulatory Visit: Payer: Commercial Managed Care - PPO

## 2022-03-28 ENCOUNTER — Telehealth: Payer: Commercial Managed Care - PPO | Admitting: Hematology and Oncology

## 2022-04-02 ENCOUNTER — Ambulatory Visit: Payer: Commercial Managed Care - PPO

## 2022-04-02 ENCOUNTER — Ambulatory Visit
Admission: RE | Admit: 2022-04-02 | Discharge: 2022-04-02 | Disposition: A | Discharge status: Home / Self Care | Payer: Commercial Managed Care - PPO | Source: Ambulatory Visit | Attending: Obstetrics and Gynecology | Admitting: Obstetrics and Gynecology

## 2022-04-02 DIAGNOSIS — M79621 Pain in right upper arm: Secondary | ICD-10-CM

## 2022-04-02 DIAGNOSIS — M79622 Pain in left upper arm: Secondary | ICD-10-CM

## 2022-04-03 ENCOUNTER — Telehealth: Payer: Commercial Managed Care - PPO | Admitting: Hematology and Oncology

## 2022-05-21 ENCOUNTER — Encounter (INDEPENDENT_AMBULATORY_CARE_PROVIDER_SITE_OTHER): Payer: Self-pay

## 2022-08-06 ENCOUNTER — Other Ambulatory Visit: Payer: Commercial Managed Care - PPO

## 2022-08-08 ENCOUNTER — Inpatient Hospital Stay: Payer: Commercial Managed Care - PPO | Attending: Hematology and Oncology

## 2022-08-08 ENCOUNTER — Other Ambulatory Visit: Payer: Self-pay | Admitting: Hematology and Oncology

## 2022-08-08 ENCOUNTER — Other Ambulatory Visit: Payer: Self-pay

## 2022-08-08 DIAGNOSIS — D801 Nonfamilial hypogammaglobulinemia: Secondary | ICD-10-CM | POA: Insufficient documentation

## 2022-08-08 DIAGNOSIS — Z8572 Personal history of non-Hodgkin lymphomas: Secondary | ICD-10-CM | POA: Diagnosis not present

## 2022-08-08 DIAGNOSIS — C8307 Small cell B-cell lymphoma, spleen: Secondary | ICD-10-CM

## 2022-08-08 LAB — CMP (CANCER CENTER ONLY)
ALT: 27 U/L (ref 0–44)
AST: 15 U/L (ref 15–41)
Albumin: 4.3 g/dL (ref 3.5–5.0)
Alkaline Phosphatase: 76 U/L (ref 38–126)
Anion gap: 5 (ref 5–15)
BUN: 13 mg/dL (ref 6–20)
CO2: 31 mmol/L (ref 22–32)
Calcium: 9.1 mg/dL (ref 8.9–10.3)
Chloride: 104 mmol/L (ref 98–111)
Creatinine: 0.97 mg/dL (ref 0.44–1.00)
GFR, Estimated: >60 mL/min (ref >60)
Glucose, Bld: 97 mg/dL (ref 70–99)
Potassium: 4.4 mmol/L (ref 3.5–5.1)
Sodium: 140 mmol/L (ref 135–145)
Total Bilirubin: 0.9 mg/dL (ref 0.3–1.2)
Total Protein: 6.7 g/dL (ref 6.5–8.1)

## 2022-08-08 LAB — CBC WITH DIFFERENTIAL (CANCER CENTER ONLY)
Abs Immature Granulocytes: 0.01 10*3/uL (ref 0.00–0.07)
Basophils Absolute: 0 10*3/uL (ref 0.0–0.1)
Basophils Relative: 0 %
Eosinophils Absolute: 0.1 10*3/uL (ref 0.0–0.5)
Eosinophils Relative: 2 %
HCT: 38 % (ref 36.0–46.0)
Hemoglobin: 13.2 g/dL (ref 12.0–15.0)
Immature Granulocytes: 0 %
Lymphocytes Relative: 22 %
Lymphs Abs: 1.1 10*3/uL (ref 0.7–4.0)
MCH: 30.6 pg (ref 26.0–34.0)
MCHC: 34.7 g/dL (ref 30.0–36.0)
MCV: 88 fL (ref 80.0–100.0)
Monocytes Absolute: 0.3 10*3/uL (ref 0.1–1.0)
Monocytes Relative: 6 %
Neutro Abs: 3.5 10*3/uL (ref 1.7–7.7)
Neutrophils Relative %: 70 %
Platelet Count: 176 10*3/uL (ref 150–400)
RBC: 4.32 MIL/uL (ref 3.87–5.11)
RDW: 12.9 % (ref 11.5–15.5)
WBC Count: 5.1 10*3/uL (ref 4.0–10.5)
nRBC: 0 % (ref 0.0–0.2)

## 2022-08-10 LAB — IGG, IGA, IGM
IgA: 151 mg/dL (ref 87–352)
IgG (Immunoglobin G), Serum: 584 mg/dL — ABNORMAL LOW (ref 586–1602)
IgM (Immunoglobulin M), Srm: 32 mg/dL (ref 26–217)

## 2022-08-14 ENCOUNTER — Inpatient Hospital Stay (HOSPITAL_BASED_OUTPATIENT_CLINIC_OR_DEPARTMENT_OTHER): Payer: Commercial Managed Care - PPO | Admitting: Hematology and Oncology

## 2022-08-14 ENCOUNTER — Encounter: Payer: Self-pay | Admitting: Hematology and Oncology

## 2022-08-14 DIAGNOSIS — D591 Autoimmune hemolytic anemia, unspecified: Secondary | ICD-10-CM | POA: Diagnosis not present

## 2022-08-14 DIAGNOSIS — C8307 Small cell B-cell lymphoma, spleen: Secondary | ICD-10-CM | POA: Diagnosis not present

## 2022-08-14 NOTE — Assessment & Plan Note (Signed)
Overall, she has no clinical signs of cancer recurrence I recommend repeat blood work and follow-up in 6 months She is in agreement

## 2022-08-14 NOTE — Progress Notes (Signed)
HEMATOLOGY-ONCOLOGY ELECTRONIC VISIT PROGRESS NOTE  Patient Care Team: Sofie Hartigan, MD as PCP - General (Family Medicine) Heath Lark, MD as Consulting Physician (Hematology and Oncology)  I connected with the patient via telephone conference and verified that I am speaking with the correct person using two identifiers. The patient's location is at home and I am providing care from the [facility] I discussed the limitations, risks, security and privacy concerns of performing an evaluation and management service by e-visits and the availability of in person appointments.  I also discussed with the patient that there may be a patient responsible charge related to this service. The patient expressed understanding and agreed to proceed.   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 The purpose of today's discussion is to review test results due to history of splenic marginal zone lymphoma and history of hemolytic anemia She is feeling well No recent lymphadenopathy or infection She is still battling with weight issues  SUMMARY OF ONCOLOGIC HISTORY: Oncology History  Lymphoma, marginal zone, spleen ([place])  03/23/2014 Bone Marrow Biopsy   Bone marrow is involved   04/19/2014 Imaging   PET CT scan  showed lymphadenopathy and splenomegaly   04/27/2014 - 06/01/2014 Chemotherapy   She received weekly rituximab and cladribine X 6 cycles   05/21/2018 PET scan   1. 8 mm in short axis right level I a lymph node with Deauville 4 activity, increased from prior. 2. Small but mildly hypermetabolic left axillary lymph node with Deauville 4 activity. 3. Splenic activity only minimally greater than that of the liver, but with a moderate amount of splenomegaly (splenic volume 680 cubic cm). No focal splenic lesion observed. 4. Diffuse mildly increased activity in the skeleton without corresponding CT abnormality. Possibilities might include low-level marrow infiltration or granulocyte stimulation.   07/02/2018 - 11/26/2018 Chemotherapy   The patient had rituximab for chemotherapy treatment.     07/02/2018 - 11/26/2018 Chemotherapy   The patient had rituximab for chemotherapy treatment.     12/30/2018 Cancer Staging   Staging form: Hodgkin and Non-Hodgkin Lymphoma, AJCC 7th Edition - Clinical: S - Spleen, B - Symptoms - Signed by Heath Lark, MD on 12/30/2018   05/26/2019 Imaging   1. No findings in the chest, abdomen or pelvis to suggest recurrent disease. 2. No acute findings in the chest, abdomen or pelvis. 3. Mild atherosclerosis in the pelvic vasculature. 4. Additional incidental findings, as above.   01/19/2020 Imaging   1. Focal consolidative opacity in the superior segment right lower lobe with patchy and nodular ground-glass and confluent airspace disease scattered throughout the right lower lobe and right middle lobe. Imaging features compatible with multifocal pneumonia. Follow-up CT chest may be warranted  to ensure resolution. 2. Mild right hilar lymphadenopathy, likely reactive.     04/20/2020 Imaging   1. Patchy ground-glass and airspace opacities medially in both lower lobes, improved on the right but new on the left. The consolidation in the superior segment right lower lobe has  substantially reduced, although there is some mild continued airspace opacity in the superior segment right lower lobe. Appearance is nonspecific but could be from cryptogenic organizing pneumonia, aspiration pneumonitis, Wegener's granulomatosis, hypersensitivity pneumonitis, chronic eosinophilic pneumonia, or less likely a manifestation of pulmonary lymphoma. 2. Interval fracture of the lateral right seventh rib and anterolateral left seventh rib with early healing response. 3. Prior right hilar lymph node is difficult to measure due to the lack of IV contrast, but probably mildly reduced in size, and currently within normal limits.     REVIEW OF SYSTEMS:   Constitutional: Denies fevers, chills or abnormal weight loss Eyes: Denies blurriness of vision Ears, nose, mouth, throat, and face: Denies mucositis or sore throat Respiratory: Denies cough, dyspnea or wheezes Cardiovascular: Denies palpitation, chest discomfort Gastrointestinal:  Denies nausea, heartburn or change in bowel habits Skin: Denies abnormal skin rashes Lymphatics: Denies new lymphadenopathy or easy bruising Neurological:Denies numbness, tingling or new weaknesses Behavioral/Psych: Mood is stable, no new changes  Extremities: No lower extremity edema All other systems were reviewed with the patient and are negative.  I have reviewed the past medical history, past surgical history, social history and family history with the patient and they are unchanged from previous note.  ALLERGIES:  is allergic to other.  MEDICATIONS:  Current Outpatient Medications  Medication Sig Dispense Refill   acetaminophen (TYLENOL) 500 MG tablet Take 1,000 mg by mouth every 6 (six) hours as needed (pain.).     No current facility-administered medications for this visit.    PHYSICAL EXAMINATION: ECOG PERFORMANCE STATUS: 0 - Asymptomatic  LABORATORY DATA:  I have reviewed the data as listed    Latest Ref Rng & Units 08/08/2022    9:19  AM 01/29/2022   11:42 AM 09/18/2021    8:50 AM  CMP  Glucose 70 - 99 mg/dL 97  97  102   BUN 6 - 20 mg/dL _0 Creatinine 0.44 - 1.00 mg/dL 0.97  0.98  0.86   Sodium 135 - 145 mmol/L 140  139  139   Potassium 3.5 - 5.1 mmol/L 4.4  4.3  4.2   Chloride 98 - 111 mmol/L 104  104  104   CO2 22 - 32 mmol/L _1 Calcium 8.9 - 10.3 mg/dL 9.1  9.3  8.4   Total Protein 6.5 - 8.1 g/dL 6.7  6.7  6.1   Total Bilirubin 0.3 - 1.2 mg/dL 0.9  0.8  0.8   Alkaline Phos 38 - 126 U/L 76  78  79   AST 15 - 41 U/L _2 ALT 0 - 44 U/L 27  39  39     Lab Results  Component Value Date   WBC 5.1 08/08/2022   HGB 13.2 08/08/2022   HCT 38.0 08/08/2022   MCV 88.0 08/08/2022   PLT 176 08/08/2022   NEUTROABS 3.5 08/08/2022    I discussed the assessment and treatment plan with the patient. The patient was provided an opportunity to ask questions and all were answered. The patient agreed with the plan and demonstrated an understanding of the instructions. The patient was advised  to call back or seek an in-person evaluation if the symptoms worsen or if the condition fails to improve as anticipated.    I spent 20 minutes for the appointment reviewing test results, discuss management and coordination of care.  Heath Lark, MD 08/14/2022 12:42 PM

## 2022-08-14 NOTE — Assessment & Plan Note (Signed)
She has history of hemolytic anemia Her blood work is now normal She is reassured

## 2023-02-12 ENCOUNTER — Other Ambulatory Visit: Payer: Commercial Managed Care - PPO

## 2023-02-13 ENCOUNTER — Inpatient Hospital Stay: Payer: Commercial Managed Care - PPO | Attending: Nurse Practitioner

## 2023-02-13 ENCOUNTER — Other Ambulatory Visit: Payer: Self-pay

## 2023-02-13 DIAGNOSIS — C8307 Small cell B-cell lymphoma, spleen: Secondary | ICD-10-CM

## 2023-02-13 DIAGNOSIS — E559 Vitamin D deficiency, unspecified: Secondary | ICD-10-CM | POA: Insufficient documentation

## 2023-02-13 DIAGNOSIS — Z8572 Personal history of non-Hodgkin lymphomas: Secondary | ICD-10-CM | POA: Insufficient documentation

## 2023-02-13 LAB — CBC WITH DIFFERENTIAL (CANCER CENTER ONLY)
Abs Immature Granulocytes: 0.03 10*3/uL (ref 0.00–0.07)
Basophils Absolute: 0 10*3/uL (ref 0.0–0.1)
Basophils Relative: 1 %
Eosinophils Absolute: 0.2 10*3/uL (ref 0.0–0.5)
Eosinophils Relative: 3 %
HCT: 37.5 % (ref 36.0–46.0)
Hemoglobin: 13.6 g/dL (ref 12.0–15.0)
Immature Granulocytes: 1 %
Lymphocytes Relative: 23 %
Lymphs Abs: 1.4 10*3/uL (ref 0.7–4.0)
MCH: 31 pg (ref 26.0–34.0)
MCHC: 36.3 g/dL — ABNORMAL HIGH (ref 30.0–36.0)
MCV: 85.4 fL (ref 80.0–100.0)
Monocytes Absolute: 0.3 10*3/uL (ref 0.1–1.0)
Monocytes Relative: 5 %
Neutro Abs: 4.3 10*3/uL (ref 1.7–7.7)
Neutrophils Relative %: 67 %
Platelet Count: 147 10*3/uL — ABNORMAL LOW (ref 150–400)
RBC: 4.39 MIL/uL (ref 3.87–5.11)
RDW: 13 % (ref 11.5–15.5)
WBC Count: 6.1 10*3/uL (ref 4.0–10.5)
nRBC: 0 % (ref 0.0–0.2)

## 2023-02-13 LAB — CMP (CANCER CENTER ONLY)
ALT: 32 U/L (ref 0–44)
AST: 18 U/L (ref 15–41)
Albumin: 4.5 g/dL (ref 3.5–5.0)
Alkaline Phosphatase: 64 U/L (ref 38–126)
Anion gap: 5 (ref 5–15)
BUN: 18 mg/dL (ref 6–20)
CO2: 30 mmol/L (ref 22–32)
Calcium: 9.1 mg/dL (ref 8.9–10.3)
Chloride: 104 mmol/L (ref 98–111)
Creatinine: 0.95 mg/dL (ref 0.44–1.00)
GFR, Estimated: >60 mL/min (ref >60)
Glucose, Bld: 98 mg/dL (ref 70–99)
Potassium: 4.1 mmol/L (ref 3.5–5.1)
Sodium: 139 mmol/L (ref 135–145)
Total Bilirubin: 0.7 mg/dL (ref 0.3–1.2)
Total Protein: 6.7 g/dL (ref 6.5–8.1)

## 2023-02-15 LAB — IGG, IGA, IGM
IgA: 250 mg/dL (ref 87–352)
IgG (Immunoglobin G), Serum: 651 mg/dL (ref 586–1602)
IgM (Immunoglobulin M), Srm: 39 mg/dL (ref 26–217)

## 2023-02-19 ENCOUNTER — Telehealth: Payer: Self-pay | Admitting: Hematology and Oncology

## 2023-02-19 ENCOUNTER — Encounter: Payer: Self-pay | Admitting: Hematology and Oncology

## 2023-02-19 ENCOUNTER — Inpatient Hospital Stay (HOSPITAL_BASED_OUTPATIENT_CLINIC_OR_DEPARTMENT_OTHER): Payer: Commercial Managed Care - PPO | Admitting: Hematology and Oncology

## 2023-02-19 DIAGNOSIS — C8307 Small cell B-cell lymphoma, spleen: Secondary | ICD-10-CM

## 2023-02-19 DIAGNOSIS — Z8579 Personal history of other malignant neoplasms of lymphoid, hematopoietic and related tissues: Secondary | ICD-10-CM

## 2023-02-19 DIAGNOSIS — E559 Vitamin D deficiency, unspecified: Secondary | ICD-10-CM

## 2023-02-19 NOTE — Progress Notes (Signed)
HEMATOLOGY-ONCOLOGY ELECTRONIC VISIT PROGRESS NOTE  Patient Care Team: Marina Goodell, MD as PCP - General (Family Medicine) Artis Delay, MD as Consulting Physician (Hematology and Oncology)  I connected with the patient via telephone conference and verified that I am speaking with the correct person using two identifiers. The patient's location is at home and I am providing care from the [facility] I discussed the limitations, risks, security and privacy concerns of performing an evaluation and management service by e-visits and the availability of in person appointments.  I also discussed with the patient that there may be a patient responsible charge related to this service. The patient expressed understanding and agreed to proceed.   ASSESSMENT & PLAN:  Lymphoma, marginal zone, spleen (HCC) Overall, she has no clinical signs of cancer recurrence I recommend repeat blood work and follow-up in 6 months She is in agreement  Vitamin D deficiency I wonder if she has vitamin D deficiency causing recent hip pain I recommend the patient to take vitamin D supplement daily  Orders Placed This Encounter  Procedures   Lactate dehydrogenase    Standing Status:   Future    Standing Expiration Date:   02/19/2024   CMP (Cancer Center only)    Standing Status:   Future    Standing Expiration Date:   02/19/2024   CBC with Differential (Cancer Center Only)    Standing Status:   Future    Standing Expiration Date:   02/19/2024   IgG, IgA, IgM    Standing Status:   Future    Standing Expiration Date:   02/19/2024    INTERVAL HISTORY: Please see below for problem oriented charting. 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 8 mm in short axis right level I a lymph node with Deauville 4 activity, increased from prior. 2. Small but mildly hypermetabolic left axillary lymph node with Deauville 4 activity. 3. Splenic activity only minimally greater than that of the liver, but with a moderate amount of splenomegaly (splenic volume 680 cubic cm). No focal splenic lesion observed. 4. Diffuse mildly increased activity in the skeleton without corresponding CT abnormality. Possibilities might include low-level marrow infiltration or granulocyte stimulation.   07/02/2018 - 11/26/2018 Chemotherapy   The patient had rituximab for chemotherapy treatment.     07/02/2018 - 11/26/2018 Chemotherapy   The patient had rituximab for chemotherapy treatment.     12/30/2018 Cancer Staging   Staging form: Hodgkin and Non-Hodgkin Lymphoma, AJCC 7th Edition - Clinical: S - Spleen, B - Symptoms - Signed by Artis Delay, MD on 12/30/2018   05/26/2019 Imaging   1. No findings in the chest, abdomen or pelvis to suggest recurrent disease. 2. No acute findings in the chest, abdomen or pelvis. 3. Mild atherosclerosis in the pelvic vasculature. 4. Additional incidental findings, as above.   01/19/2020 Imaging  1. Focal consolidative opacity in the superior segment right lower lobe with patchy and nodular ground-glass and confluent airspace disease scattered throughout the right lower lobe and right middle lobe. Imaging features compatible with multifocal pneumonia. Follow-up CT chest may be warranted to ensure resolution. 2. Mild right hilar lymphadenopathy, likely reactive.      04/20/2020 Imaging   1. Patchy ground-glass and airspace opacities medially in both lower lobes, improved on the right but new on the left. The consolidation in the superior segment right lower lobe has substantially reduced, although there is some mild continued airspace opacity in the superior segment right lower lobe. Appearance is nonspecific but could be from cryptogenic organizing pneumonia, aspiration pneumonitis, Wegener's granulomatosis, hypersensitivity pneumonitis, chronic eosinophilic pneumonia, or less likely a manifestation of pulmonary lymphoma. 2. Interval fracture of the lateral right seventh rib and anterolateral left seventh rib with early healing response. 3. Prior right hilar lymph node is difficult to measure due to the lack of IV contrast, but probably mildly reduced in size, and currently within normal limits.     REVIEW OF SYSTEMS:   Constitutional: Denies fevers, chills or abnormal weight loss Eyes: Denies blurriness of vision Ears, nose, mouth, throat, and face: Denies mucositis or sore throat Respiratory: Denies cough, dyspnea or wheezes Cardiovascular: Denies palpitation, chest discomfort Gastrointestinal:  Denies nausea, heartburn or change in bowel habits Skin: Denies abnormal skin rashes Lymphatics: Denies new lymphadenopathy or easy bruising Neurological:Denies numbness, tingling or new weaknesses Behavioral/Psych: Mood is stable, no new changes  Extremities: No lower extremity edema All other systems were reviewed with the patient and are negative.  I have reviewed the past medical history, past surgical history, social history and family history with the patient and they are unchanged from previous note.  ALLERGIES:  is allergic to other.  MEDICATIONS:  Current Outpatient Medications  Medication Sig Dispense Refill   cholecalciferol (VITAMIN D3) 25 MCG (1000 UNIT) tablet Take 2,000 Units by mouth daily.     ibuprofen (ADVIL) 400 MG tablet Take 400 mg  by mouth every 6 (six) hours as needed for moderate pain.     acetaminophen (TYLENOL) 500 MG tablet Take 1,000 mg by mouth every 6 (six) hours as needed (pain.).     No current facility-administered medications for this visit.    PHYSICAL EXAMINATION: ECOG PERFORMANCE STATUS: 0 - Asymptomatic  LABORATORY DATA:  I have reviewed the data as listed    Latest Ref Rng & Units 02/13/2023    9:08 AM 08/08/2022    9:19 AM 01/29/2022   11:42 AM  CMP  Glucose 70 - 99 mg/dL 98  97  97   BUN 6 - 20 mg/dL 18  13  16    Creatinine 0.44 - 1.00 mg/dL 5.18  8.41  6.60   Sodium 135 - 145 mmol/L 139  140  139   Potassium 3.5 - 5.1 mmol/L 4.1  4.4  4.3   Chloride 98 - 111 mmol/L 104  104  104   CO2 22 - 32 mmol/L 30  31  29    Calcium 8.9 - 10.3 mg/dL 9.1  9.1  9.3   Total Protein 6.5 - 8.1 g/dL 6.7  6.7  6.7   Total Bilirubin 0.3 - 1.2 mg/dL 0.7  0.9  0.8   Alkaline Phos 38 - 126 U/L 64  76  78   AST 15 - 41 U/L 18  15  22    ALT 0 - 44 U/L 32  27  39  Lab Results  Component Value Date   WBC 6.1 02/13/2023   HGB 13.6 02/13/2023   HCT 37.5 02/13/2023   MCV 85.4 02/13/2023   PLT 147 (L) 02/13/2023   NEUTROABS 4.3 02/13/2023     I discussed the assessment and treatment plan with the patient. The patient was provided an opportunity to ask questions and all were answered. The patient agreed with the plan and demonstrated an understanding of the instructions. The patient was advised to call back or seek an in-person evaluation if the symptoms worsen or if the condition fails to improve as anticipated.    I spent 20 minutes for the appointment reviewing test results, discuss management and coordination of care.  Artis Delay, MD 02/19/2023 1:09 PM

## 2023-02-19 NOTE — Telephone Encounter (Signed)
Called patient recived no answe. Unable to leave vm mailbox too full

## 2023-02-19 NOTE — Assessment & Plan Note (Signed)
Overall, she has no clinical signs of cancer recurrence ?I recommend repeat blood work and follow-up in 6 months ?She is in agreement ?

## 2023-02-19 NOTE — Assessment & Plan Note (Signed)
I wonder if she has vitamin D deficiency causing recent hip pain I recommend the patient to take vitamin D supplement daily

## 2023-08-20 ENCOUNTER — Telehealth: Payer: Self-pay

## 2023-08-20 NOTE — Telephone Encounter (Signed)
Attempted to call patient in regards to changes needed to be made to upcoming appointments. No answer and unable to leave voicemail, due to full mailbox. Message sent via myChart requesting patient to call the office.

## 2023-08-20 NOTE — Telephone Encounter (Signed)
Patient's appointments rescheduled.  Patient confirmed new appointment dates and times.

## 2023-08-25 ENCOUNTER — Telehealth: Payer: Commercial Managed Care - PPO | Admitting: Hematology and Oncology

## 2023-08-28 ENCOUNTER — Inpatient Hospital Stay: Payer: Commercial Managed Care - PPO | Attending: Hematology and Oncology

## 2023-08-28 DIAGNOSIS — Z9221 Personal history of antineoplastic chemotherapy: Secondary | ICD-10-CM | POA: Diagnosis not present

## 2023-08-28 DIAGNOSIS — R7303 Prediabetes: Secondary | ICD-10-CM | POA: Diagnosis not present

## 2023-08-28 DIAGNOSIS — Z8572 Personal history of non-Hodgkin lymphomas: Secondary | ICD-10-CM | POA: Insufficient documentation

## 2023-08-28 DIAGNOSIS — C8307 Small cell B-cell lymphoma, spleen: Secondary | ICD-10-CM

## 2023-08-28 LAB — CBC WITH DIFFERENTIAL (CANCER CENTER ONLY)
Abs Immature Granulocytes: 0.01 10*3/uL (ref 0.00–0.07)
Basophils Absolute: 0 10*3/uL (ref 0.0–0.1)
Basophils Relative: 0 %
Eosinophils Absolute: 0.1 10*3/uL (ref 0.0–0.5)
Eosinophils Relative: 2 %
HCT: 39.9 % (ref 36.0–46.0)
Hemoglobin: 13.5 g/dL (ref 12.0–15.0)
Immature Granulocytes: 0 %
Lymphocytes Relative: 21 %
Lymphs Abs: 1.1 10*3/uL (ref 0.7–4.0)
MCH: 29.7 pg (ref 26.0–34.0)
MCHC: 33.8 g/dL (ref 30.0–36.0)
MCV: 87.9 fL (ref 80.0–100.0)
Monocytes Absolute: 0.3 10*3/uL (ref 0.1–1.0)
Monocytes Relative: 5 %
Neutro Abs: 3.6 10*3/uL (ref 1.7–7.7)
Neutrophils Relative %: 72 %
Platelet Count: 168 10*3/uL (ref 150–400)
RBC: 4.54 MIL/uL (ref 3.87–5.11)
RDW: 13.1 % (ref 11.5–15.5)
WBC Count: 5.1 10*3/uL (ref 4.0–10.5)
nRBC: 0 % (ref 0.0–0.2)

## 2023-08-28 LAB — CMP (CANCER CENTER ONLY)
ALT: 32 U/L (ref 0–44)
AST: 19 U/L (ref 15–41)
Albumin: 4.4 g/dL (ref 3.5–5.0)
Alkaline Phosphatase: 83 U/L (ref 38–126)
Anion gap: 6 (ref 5–15)
BUN: 11 mg/dL (ref 6–20)
CO2: 30 mmol/L (ref 22–32)
Calcium: 9.3 mg/dL (ref 8.9–10.3)
Chloride: 103 mmol/L (ref 98–111)
Creatinine: 0.92 mg/dL (ref 0.44–1.00)
GFR, Estimated: >60 mL/min (ref >60)
Glucose, Bld: 122 mg/dL — ABNORMAL HIGH (ref 70–99)
Potassium: 4.1 mmol/L (ref 3.5–5.1)
Sodium: 139 mmol/L (ref 135–145)
Total Bilirubin: 0.8 mg/dL (ref <1.2)
Total Protein: 6.9 g/dL (ref 6.5–8.1)

## 2023-08-28 LAB — LACTATE DEHYDROGENASE: LDH: 143 U/L (ref 98–192)

## 2023-08-30 LAB — IGG, IGA, IGM
IgA: 248 mg/dL (ref 87–352)
IgG (Immunoglobin G), Serum: 628 mg/dL (ref 586–1602)
IgM (Immunoglobulin M), Srm: 48 mg/dL (ref 26–217)

## 2023-09-02 ENCOUNTER — Other Ambulatory Visit: Payer: Commercial Managed Care - PPO

## 2023-09-04 ENCOUNTER — Encounter: Payer: Self-pay | Admitting: Hematology and Oncology

## 2023-09-04 ENCOUNTER — Telehealth: Payer: Self-pay | Admitting: Hematology and Oncology

## 2023-09-04 ENCOUNTER — Inpatient Hospital Stay (HOSPITAL_BASED_OUTPATIENT_CLINIC_OR_DEPARTMENT_OTHER): Payer: Commercial Managed Care - PPO | Admitting: Hematology and Oncology

## 2023-09-04 DIAGNOSIS — R7303 Prediabetes: Secondary | ICD-10-CM | POA: Diagnosis not present

## 2023-09-04 DIAGNOSIS — C8307 Small cell B-cell lymphoma, spleen: Secondary | ICD-10-CM | POA: Diagnosis not present

## 2023-09-04 DIAGNOSIS — D801 Nonfamilial hypogammaglobulinemia: Secondary | ICD-10-CM

## 2023-09-04 NOTE — Progress Notes (Signed)
HEMATOLOGY-ONCOLOGY ELECTRONIC VISIT PROGRESS NOTE  Patient Care Team: Marina Goodell, MD as PCP - General (Family Medicine) Artis Delay, MD as Consulting Physician (Hematology and Oncology)  I connected with the patient via telephone conference and verified that I am speaking with the correct person using two identifiers. The patient's location is at home and I am providing care from the [facility] I discussed the limitations, risks, security and privacy concerns of performing an evaluation and management service by e-visits and the availability of in person appointments.  I also discussed with the patient that there may be a patient responsible charge related to this service. The patient expressed understanding and agreed to proceed.   ASSESSMENT & PLAN:  Lymphoma, marginal zone, spleen (HCC) Overall, she has no clinical signs of cancer recurrence I recommend repeat blood work and follow-up in 6 months She is in agreement  Prediabetes The patient has diagnosis of intermittent hypoglycemia in the past but based on her current labs, she is probably prediabetic We discussed importance of dietary modification and exercise  Orders Placed This Encounter  Procedures   CMP (Cancer Center only)    Standing Status:   Future    Standing Expiration Date:   09/03/2024   CBC with Differential (Cancer Center Only)    Standing Status:   Future    Standing Expiration Date:   09/03/2024   Lactate dehydrogenase    Standing Status:   Future    Standing Expiration Date:   09/03/2024   IgG, IgA, IgM    Standing Status:   Future    Standing Expiration Date:   09/03/2024    INTERVAL HISTORY: Please see below for problem oriented charting. The purpose of today's discussion is to review test results, given history of recurrent marginal zone lymphoma She is doing well No recent infection We discussed recent test results The patient is concerned about elevated blood sugar She has not have  any meals prior to the blood draw since the night before  SUMMARY OF ONCOLOGIC HISTORY: Oncology History  Lymphoma, marginal zone, spleen (HCC)  03/23/2014 Bone Marrow Biopsy   Bone marrow is involved   04/19/2014 Imaging   PET CT scan showed lymphadenopathy and splenomegaly   04/27/2014 - 06/01/2014 Chemotherapy   She received weekly rituximab and cladribine X 6 cycles   05/21/2018 PET scan   1. 8 mm in short axis right level I a lymph node with Deauville 4 activity, increased from prior. 2. Small but mildly hypermetabolic left axillary lymph node with Deauville 4 activity. 3. Splenic activity only minimally greater than that of the liver, but with a moderate amount of splenomegaly (splenic volume 680 cubic cm). No focal splenic lesion observed. 4. Diffuse mildly increased activity in the skeleton without corresponding CT abnormality. Possibilities might include low-level marrow infiltration or granulocyte stimulation.   07/02/2018 - 11/26/2018 Chemotherapy   The patient had rituximab for chemotherapy treatment.     07/02/2018 - 11/26/2018 Chemotherapy   The patient had rituximab for chemotherapy treatment.     12/30/2018 Cancer Staging   Staging form: Hodgkin and Non-Hodgkin Lymphoma, AJCC 7th Edition - Clinical: S - Spleen, B - Symptoms - Signed by Artis Delay, MD on 12/30/2018   05/26/2019 Imaging   1. No findings in the chest, abdomen or pelvis to suggest recurrent disease. 2. No acute findings in the chest, abdomen or pelvis. 3. Mild atherosclerosis in the pelvic vasculature. 4. Additional incidental findings, as above.   01/19/2020 Imaging  1. Focal consolidative opacity in the superior segment right lower lobe with patchy and nodular ground-glass and confluent airspace disease scattered throughout the right lower lobe and right middle lobe. Imaging features compatible with multifocal pneumonia. Follow-up CT chest may be warranted to ensure resolution. 2. Mild right hilar  lymphadenopathy, likely reactive.     04/20/2020 Imaging   1. Patchy ground-glass and airspace opacities medially in both lower lobes, improved on the right but new on the left. The consolidation in the superior segment right lower lobe has substantially reduced, although there is some mild continued airspace opacity in the superior segment right lower lobe. Appearance is nonspecific but could be from cryptogenic organizing pneumonia, aspiration pneumonitis, Wegener's granulomatosis, hypersensitivity pneumonitis, chronic eosinophilic pneumonia, or less likely a manifestation of pulmonary lymphoma. 2. Interval fracture of the lateral right seventh rib and anterolateral left seventh rib with early healing response. 3. Prior right hilar lymph node is difficult to measure due to the lack of IV contrast, but probably mildly reduced in size, and currently within normal limits.     REVIEW OF SYSTEMS:   Constitutional: Denies fevers, chills or abnormal weight loss Eyes: Denies blurriness of vision Ears, nose, mouth, throat, and face: Denies mucositis or sore throat Respiratory: Denies cough, dyspnea or wheezes Cardiovascular: Denies palpitation, chest discomfort Gastrointestinal:  Denies nausea, heartburn or change in bowel habits Skin: Denies abnormal skin rashes Lymphatics: Denies new lymphadenopathy or easy bruising Neurological:Denies numbness, tingling or new weaknesses Behavioral/Psych: Mood is stable, no new changes  Extremities: No lower extremity edema All other systems were reviewed with the patient and are negative.  I have reviewed the past medical history, past surgical history, social history and family history with the patient and they are unchanged from previous note.  ALLERGIES:  is allergic to other.  MEDICATIONS:  Current Outpatient Medications  Medication Sig Dispense Refill   acetaminophen (TYLENOL) 500 MG tablet Take 1,000 mg by mouth every 6 (six) hours as needed (pain.).      cholecalciferol (VITAMIN D3) 25 MCG (1000 UNIT) tablet Take 2,000 Units by mouth daily.     ibuprofen (ADVIL) 400 MG tablet Take 400 mg by mouth every 6 (six) hours as needed for moderate pain.     No current facility-administered medications for this visit.    PHYSICAL EXAMINATION: ECOG PERFORMANCE STATUS: 0 - Asymptomatic  LABORATORY DATA:  I have reviewed the data as listed    Latest Ref Rng & Units 08/28/2023    8:24 AM 02/13/2023    9:08 AM 08/08/2022    9:19 AM  CMP  Glucose 70 - 99 mg/dL 295  98  97   BUN 6 - 20 mg/dL 11  18  13    Creatinine 0.44 - 1.00 mg/dL 2.84  1.32  4.40   Sodium 135 - 145 mmol/L 139  139  140   Potassium 3.5 - 5.1 mmol/L 4.1  4.1  4.4   Chloride 98 - 111 mmol/L 103  104  104   CO2 22 - 32 mmol/L 30  30  31    Calcium 8.9 - 10.3 mg/dL 9.3  9.1  9.1   Total Protein 6.5 - 8.1 g/dL 6.9  6.7  6.7   Total Bilirubin <1.2 mg/dL 0.8  0.7  0.9   Alkaline Phos 38 - 126 U/L 83  64  76   AST 15 - 41 U/L 19  18  15    ALT 0 - 44 U/L 32  32  27  Lab Results  Component Value Date   WBC 5.1 08/28/2023   HGB 13.5 08/28/2023   HCT 39.9 08/28/2023   MCV 87.9 08/28/2023   PLT 168 08/28/2023   NEUTROABS 3.6 08/28/2023   I discussed the assessment and treatment plan with the patient. The patient was provided an opportunity to ask questions and all were answered. The patient agreed with the plan and demonstrated an understanding of the instructions. The patient was advised to call back or seek an in-person evaluation if the symptoms worsen or if the condition fails to improve as anticipated.    I spent 20 minutes for the appointment reviewing test results, discuss management and coordination of care.  Artis Delay, MD 09/04/2023 2:37 PM

## 2023-09-04 NOTE — Telephone Encounter (Signed)
Spoke with patient confirming upcoming appointment  

## 2023-09-04 NOTE — Assessment & Plan Note (Signed)
The patient has diagnosis of intermittent hypoglycemia in the past but based on her current labs, she is probably prediabetic We discussed importance of dietary modification and exercise

## 2023-09-04 NOTE — Assessment & Plan Note (Signed)
Overall, she has no clinical signs of cancer recurrence ?I recommend repeat blood work and follow-up in 6 months ?She is in agreement ?

## 2024-02-12 ENCOUNTER — Telehealth: Payer: Self-pay | Admitting: Hematology and Oncology

## 2024-02-12 NOTE — Telephone Encounter (Signed)
 Kara Perkins rescheduled her appointments.

## 2024-02-15 ENCOUNTER — Other Ambulatory Visit: Payer: Commercial Managed Care - PPO

## 2024-02-25 ENCOUNTER — Telehealth: Payer: Commercial Managed Care - PPO | Admitting: Hematology and Oncology

## 2024-02-26 ENCOUNTER — Telehealth: Payer: Self-pay | Admitting: Hematology and Oncology

## 2024-02-29 ENCOUNTER — Other Ambulatory Visit

## 2024-03-04 ENCOUNTER — Inpatient Hospital Stay: Attending: Hematology and Oncology

## 2024-03-04 DIAGNOSIS — Z8571 Personal history of Hodgkin lymphoma: Secondary | ICD-10-CM | POA: Diagnosis present

## 2024-03-04 DIAGNOSIS — Z9221 Personal history of antineoplastic chemotherapy: Secondary | ICD-10-CM | POA: Diagnosis not present

## 2024-03-04 DIAGNOSIS — D801 Nonfamilial hypogammaglobulinemia: Secondary | ICD-10-CM

## 2024-03-04 DIAGNOSIS — C8307 Small cell B-cell lymphoma, spleen: Secondary | ICD-10-CM

## 2024-03-04 LAB — CBC WITH DIFFERENTIAL (CANCER CENTER ONLY)
Abs Immature Granulocytes: 0.01 10*3/uL (ref 0.00–0.07)
Basophils Absolute: 0 10*3/uL (ref 0.0–0.1)
Basophils Relative: 1 %
Eosinophils Absolute: 0.1 10*3/uL (ref 0.0–0.5)
Eosinophils Relative: 3 %
HCT: 38.9 % (ref 36.0–46.0)
Hemoglobin: 13.7 g/dL (ref 12.0–15.0)
Immature Granulocytes: 0 %
Lymphocytes Relative: 24 %
Lymphs Abs: 1 10*3/uL (ref 0.7–4.0)
MCH: 29.8 pg (ref 26.0–34.0)
MCHC: 35.2 g/dL (ref 30.0–36.0)
MCV: 84.6 fL (ref 80.0–100.0)
Monocytes Absolute: 0.3 10*3/uL (ref 0.1–1.0)
Monocytes Relative: 6 %
Neutro Abs: 2.9 10*3/uL (ref 1.7–7.7)
Neutrophils Relative %: 66 %
Platelet Count: 171 10*3/uL (ref 150–400)
RBC: 4.6 MIL/uL (ref 3.87–5.11)
RDW: 13.4 % (ref 11.5–15.5)
WBC Count: 4.4 10*3/uL (ref 4.0–10.5)
nRBC: 0 % (ref 0.0–0.2)

## 2024-03-04 LAB — CMP (CANCER CENTER ONLY)
ALT: 28 U/L (ref 0–44)
AST: 18 U/L (ref 15–41)
Albumin: 4.8 g/dL (ref 3.5–5.0)
Alkaline Phosphatase: 89 U/L (ref 38–126)
Anion gap: 6 (ref 5–15)
BUN: 14 mg/dL (ref 6–20)
CO2: 31 mmol/L (ref 22–32)
Calcium: 9.4 mg/dL (ref 8.9–10.3)
Chloride: 103 mmol/L (ref 98–111)
Creatinine: 1 mg/dL (ref 0.44–1.00)
GFR, Estimated: >60 mL/min (ref >60)
Glucose, Bld: 105 mg/dL — ABNORMAL HIGH (ref 70–99)
Potassium: 4.1 mmol/L (ref 3.5–5.1)
Sodium: 140 mmol/L (ref 135–145)
Total Bilirubin: 0.8 mg/dL (ref 0.0–1.2)
Total Protein: 7.3 g/dL (ref 6.5–8.1)

## 2024-03-04 LAB — LACTATE DEHYDROGENASE: LDH: 163 U/L (ref 98–192)

## 2024-03-05 LAB — IGG, IGA, IGM
IgA: 272 mg/dL (ref 87–352)
IgG (Immunoglobin G), Serum: 625 mg/dL (ref 586–1602)
IgM (Immunoglobulin M), Srm: 57 mg/dL (ref 26–217)

## 2024-03-08 ENCOUNTER — Telehealth: Admitting: Hematology and Oncology

## 2024-03-10 ENCOUNTER — Inpatient Hospital Stay (HOSPITAL_BASED_OUTPATIENT_CLINIC_OR_DEPARTMENT_OTHER): Admitting: Hematology and Oncology

## 2024-03-10 ENCOUNTER — Encounter: Payer: Self-pay | Admitting: Hematology and Oncology

## 2024-03-10 DIAGNOSIS — C8307 Small cell B-cell lymphoma, spleen: Secondary | ICD-10-CM

## 2024-03-10 NOTE — Assessment & Plan Note (Signed)
 Overall, she has no clinical signs of cancer recurrence ?I recommend repeat blood work and follow-up in 6 months ?She is in agreement ?

## 2024-03-10 NOTE — Progress Notes (Signed)
 HEMATOLOGY-ONCOLOGY ELECTRONIC VISIT PROGRESS NOTE  Patient Care Team: Lorrie Rothman, MD as PCP - General (Family Medicine) Almeda Jacobs, MD as Consulting Physician (Hematology and Oncology)  I connected with the patient via telephone conference and verified that I am speaking with the correct person using two identifiers. The patient's location is at home and I am providing care from the [facility] I discussed the limitations, risks, security and privacy concerns of performing an evaluation and management service by e-visits and the availability of in person appointments.  I also discussed with the patient that there may be a patient responsible charge related to this service. The patient expressed understanding and agreed to proceed.   ASSESSMENT & PLAN:  Lymphoma, marginal zone, spleen (HCC) Overall, she has no clinical signs of cancer recurrence I recommend repeat blood work and follow-up in 6 months She is in agreement  Orders Placed This Encounter  Procedures   CMP (Cancer Center only)    Standing Status:   Future    Expiration Date:   03/10/2025   CBC with Differential (Cancer Center Only)    Standing Status:   Future    Expiration Date:   03/10/2025   IgG, IgA, IgM    Standing Status:   Future    Expiration Date:   03/10/2025    INTERVAL HISTORY: Please see below for problem oriented charting. 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 8 mm in short axis right level I a lymph node with Deauville 4 activity, increased from prior. 2. Small but mildly hypermetabolic left axillary lymph node with Deauville 4 activity. 3. Splenic activity only minimally greater than that of the liver, but with a moderate amount of splenomegaly (splenic volume 680 cubic cm). No focal splenic lesion observed. 4. Diffuse mildly increased activity in the skeleton without corresponding CT abnormality. Possibilities might include low-level marrow infiltration or granulocyte stimulation.   07/02/2018 - 11/26/2018 Chemotherapy   The patient had rituximab  for chemotherapy treatment.     07/02/2018 - 11/26/2018 Chemotherapy   The patient had rituximab  for chemotherapy treatment.     12/30/2018 Cancer Staging   Staging form: Hodgkin and Non-Hodgkin Lymphoma, AJCC 7th Edition - Clinical: S - Spleen, B - Symptoms - Signed by Almeda Jacobs, MD on 12/30/2018   05/26/2019 Imaging   1. No findings in the chest, abdomen or pelvis to suggest recurrent disease. 2. No acute findings in the chest, abdomen or pelvis. 3. Mild atherosclerosis in the pelvic vasculature. 4. Additional incidental findings, as above.   01/19/2020 Imaging   1. Focal consolidative opacity in the superior segment right lower lobe with patchy and nodular ground-glass and confluent airspace disease scattered throughout the right lower lobe and right middle lobe. Imaging features compatible with multifocal pneumonia. Follow-up CT chest may be warranted to ensure resolution. 2. Mild right hilar lymphadenopathy, likely reactive.  04/20/2020 Imaging   1. Patchy ground-glass and airspace opacities medially in both lower lobes, improved on the right but new on the left. The consolidation in the superior segment right lower lobe has substantially reduced, although there is some mild continued airspace opacity in the superior  segment right lower lobe. Appearance is nonspecific but could be from cryptogenic organizing pneumonia, aspiration pneumonitis, Wegener's granulomatosis, hypersensitivity pneumonitis, chronic eosinophilic pneumonia, or less likely a manifestation of pulmonary lymphoma. 2. Interval fracture of the lateral right seventh rib and anterolateral left seventh rib with early healing response. 3. Prior right hilar lymph node is difficult to measure due to the lack of IV contrast, but probably mildly reduced in size, and currently within normal limits.    I discussed the assessment and treatment plan with the patient. The patient was provided an opportunity to ask questions and all were answered. The patient agreed with the plan and demonstrated an understanding of the instructions. The patient was advised to call back or seek an in-person evaluation if the symptoms worsen or if the condition fails to improve as anticipated.    I spent 20 minutes for the appointment reviewing test results, discuss management and coordination of care.  Almeda Jacobs, MD 03/10/2024 1:36 PM

## 2024-07-22 ENCOUNTER — Telehealth: Payer: Self-pay

## 2024-07-22 NOTE — Telephone Encounter (Signed)
 Attempted to call to schedule appt with Dr. Lonn 6 months from last appt, with labs 7 days prior to appt. Mychart message sent asking her to contact the office regarding appt.

## 2024-08-24 ENCOUNTER — Telehealth: Payer: Self-pay

## 2024-08-24 NOTE — Telephone Encounter (Signed)
 F/u requested w/ Dr Lonn. Lab set @ 1000 11/28 and provider at 1140.

## 2024-09-09 ENCOUNTER — Inpatient Hospital Stay: Attending: Hematology and Oncology

## 2024-09-09 ENCOUNTER — Encounter: Payer: Self-pay | Admitting: Hematology and Oncology

## 2024-09-09 ENCOUNTER — Inpatient Hospital Stay: Admitting: Hematology and Oncology

## 2024-09-09 ENCOUNTER — Inpatient Hospital Stay

## 2024-09-09 VITALS — BP 129/64 | HR 82 | Temp 97.0°F | Resp 16 | Wt 206.4 lb

## 2024-09-09 DIAGNOSIS — D539 Nutritional anemia, unspecified: Secondary | ICD-10-CM | POA: Diagnosis not present

## 2024-09-09 DIAGNOSIS — D61818 Other pancytopenia: Secondary | ICD-10-CM | POA: Insufficient documentation

## 2024-09-09 DIAGNOSIS — C8307 Small cell B-cell lymphoma, spleen: Secondary | ICD-10-CM | POA: Diagnosis not present

## 2024-09-09 DIAGNOSIS — Z8572 Personal history of non-Hodgkin lymphomas: Secondary | ICD-10-CM | POA: Insufficient documentation

## 2024-09-09 DIAGNOSIS — Z9221 Personal history of antineoplastic chemotherapy: Secondary | ICD-10-CM | POA: Insufficient documentation

## 2024-09-09 LAB — CMP (CANCER CENTER ONLY)
ALT: 25 U/L (ref 0–44)
AST: 21 U/L (ref 15–41)
Albumin: 4.3 g/dL (ref 3.5–5.0)
Alkaline Phosphatase: 94 U/L (ref 38–126)
Anion gap: 10 (ref 5–15)
BUN: 16 mg/dL (ref 6–20)
CO2: 27 mmol/L (ref 22–32)
Calcium: 9 mg/dL (ref 8.9–10.3)
Chloride: 102 mmol/L (ref 98–111)
Creatinine: 0.94 mg/dL (ref 0.44–1.00)
GFR, Estimated: >60 mL/min (ref >60)
Glucose, Bld: 103 mg/dL — ABNORMAL HIGH (ref 70–99)
Potassium: 4 mmol/L (ref 3.5–5.1)
Sodium: 138 mmol/L (ref 135–145)
Total Bilirubin: 0.8 mg/dL (ref 0.0–1.2)
Total Protein: 6.9 g/dL (ref 6.5–8.1)

## 2024-09-09 LAB — CBC WITH DIFFERENTIAL (CANCER CENTER ONLY)
Abs Immature Granulocytes: 0.02 K/uL (ref 0.00–0.07)
Basophils Absolute: 0 K/uL (ref 0.0–0.1)
Basophils Relative: 0 %
Eosinophils Absolute: 0.1 K/uL (ref 0.0–0.5)
Eosinophils Relative: 2 %
HCT: 33.8 % — ABNORMAL LOW (ref 36.0–46.0)
Hemoglobin: 11.7 g/dL — ABNORMAL LOW (ref 12.0–15.0)
Immature Granulocytes: 0 %
Lymphocytes Relative: 27 %
Lymphs Abs: 1.4 K/uL (ref 0.7–4.0)
MCH: 29.7 pg (ref 26.0–34.0)
MCHC: 34.6 g/dL (ref 30.0–36.0)
MCV: 85.8 fL (ref 80.0–100.0)
Monocytes Absolute: 0.3 K/uL (ref 0.1–1.0)
Monocytes Relative: 6 %
Neutro Abs: 3.2 K/uL (ref 1.7–7.7)
Neutrophils Relative %: 65 %
Platelet Count: 145 K/uL — ABNORMAL LOW (ref 150–400)
RBC: 3.94 MIL/uL (ref 3.87–5.11)
RDW: 14.2 % (ref 11.5–15.5)
WBC Count: 5 K/uL (ref 4.0–10.5)
nRBC: 0 % (ref 0.0–0.2)

## 2024-09-09 NOTE — Assessment & Plan Note (Addendum)
 The patient was originally diagnosed with marginal zone lymphoma in 2015, status post 6 cycles of rituximab  and cladribine  Which she received maintenance rituximab  from 2019-2020  Overall, she has no clinical signs of cancer recurrence Has slight palpable lymphadenopathy in the left axilla is consistent with reactive lymphadenopathy from skin infection that has since resolved I recommend repeat blood work and follow-up in 12 months She is in agreement

## 2024-09-09 NOTE — Assessment & Plan Note (Addendum)
 Labs today show mild pancytopenia I recommend vitamin B12 and iron  studies but unfortunately the lab cannot add it She is not keen to get stuck again and can be observed

## 2024-09-09 NOTE — Progress Notes (Signed)
 [facility] OFFICE PROGRESS NOTE  Patient Care Team: Kara Cheryl BRAVO, MD as PCP - General (Family Medicine) Kara Hicks, MD as Consulting Physician (Hematology and Oncology)  Assessment & Plan Lymphoma, marginal zone, spleen [facility]) The patient was originally diagnosed with marginal zone lymphoma in 2015, status post 6 cycles of rituximab  and cladribine  Which she received maintenance rituximab  from 2019-2020  Overall, she has no clinical signs of cancer recurrence Has slight palpable lymphadenopathy in the left axilla is consistent with reactive lymphadenopathy from skin infection that has since resolved I recommend repeat blood work and follow-up in 12 months She is in agreement Other pancytopenia (HCC) Labs today show mild pancytopenia I recommend vitamin B12 and iron  studies but unfortunately the lab cannot add it She is not keen to get stuck again and can be observed  Orders Placed This Encounter  Procedures   Vitamin B12    Standing Status:   Future    Expiration Date:   09/09/2025   Iron  and Iron  Binding Capacity ([facility] only)    Standing Status:   Future    Expiration Date:   09/09/2025   Ferritin    Standing Status:   Future    Expiration Date:   09/09/2025   CMP (Cancer Center only)    Standing Status:   Future    Expiration Date:   09/09/2025   CBC with Differential (Cancer Center Only)    Standing Status:   Future    Expiration Date:   09/09/2025   IgG, IgA, IgM    Standing Status:   Future    Expiration Date:   09/09/2025     Perkins Lonn, MD  INTERVAL HISTORY: she returns for surveillance follow-up for history of recurrent lymphoma She noticed some discomfort on the left axilla approximately 2 weeks ago Started with a slight skin infection that has since resolved She has no other lymphadenopathy  PHYSICAL EXAMINATION: ECOG PERFORMANCE STATUS: 1 - Symptomatic but completely ambulatory  Vitals:   09/09/24 1125  BP: 129/64  Pulse: 82   Resp: 16  Temp: (!) 97 F (36.1 C)  SpO2: 97%   Filed Weights   09/09/24 1125  Weight: 206 lb 6.4 oz (93.6 kg)   GENERAL:alert, no distress and comfortable SKIN: skin color, texture, turgor are normal, no rashes or significant lesions EYES: normal, conjunctiva are pink and non-injected, sclera clear OROPHARYNX:no exudate, no erythema and lips, buccal mucosa, and tongue normal  NECK: supple, thyroid normal size, non-tender, without nodularity LYMPH: She has palpable tender lymph node in the left axilla with associated skin infection consistent with reactive lymphadenopathy.  No other lymphadenopathy elsewhere LUNGS: clear to auscultation and percussion with normal breathing effort HEART: regular rate & rhythm and no murmurs and no lower extremity edema ABDOMEN:abdomen soft, non-tender and normal bowel sounds.  Spleen is not palpable Musculoskeletal:no cyanosis of digits and no clubbing  PSYCH: alert & oriented x 3 with fluent speech NEURO: no focal motor/sensory deficits  Relevant data reviewed during this visit included CBC and CMP

## 2024-09-10 LAB — IGG, IGA, IGM
IgA: 242 mg/dL (ref 87–352)
IgG (Immunoglobin G), Serum: 707 mg/dL (ref 586–1602)
IgM (Immunoglobulin M), Srm: 136 mg/dL (ref 26–217)

## 2024-09-12 ENCOUNTER — Telehealth: Payer: Self-pay

## 2024-09-12 ENCOUNTER — Other Ambulatory Visit: Payer: Self-pay | Admitting: Hematology and Oncology

## 2024-09-12 MED ORDER — SULFAMETHOXAZOLE-TRIMETHOPRIM 800-160 MG PO TABS: 1.0000 | ORAL_TABLET | Freq: Two times a day (BID) | ORAL | 0 refills | Status: DC

## 2024-09-12 NOTE — Telephone Encounter (Signed)
 done

## 2024-09-12 NOTE — Telephone Encounter (Signed)
-----   Message from Kara Perkins sent at 09/12/2024  8:22 AM EST ----- Her immunoglobulin levels are normal We could not order iron /B12 from labs on Friday, she can follow with PCP

## 2024-09-12 NOTE — Telephone Encounter (Signed)
 Attempted to call and unable to leave a message due to mailbox being full.

## 2024-09-12 NOTE — Telephone Encounter (Signed)
 Called and given below message. She verbalized understanding and will call PCP for labs.  She is still complaining of left axilla swelling but now can feel a lump on the left side of her shoulder area close to her neck. Both areas are a little sore but not painful. Denies fever.

## 2024-09-12 NOTE — Telephone Encounter (Signed)
 We can send in a course of antibiotics Let me know which pharmacy

## 2024-09-12 NOTE — Telephone Encounter (Signed)
 Called her back and given below message. She appreciates the antibiotic and ask that it be sent to [facility] in [place].

## 2024-09-23 ENCOUNTER — Other Ambulatory Visit: Payer: Self-pay | Admitting: Hematology and Oncology

## 2024-09-23 ENCOUNTER — Telehealth: Payer: Self-pay

## 2024-09-23 DIAGNOSIS — C8307 Small cell B-cell lymphoma, spleen: Secondary | ICD-10-CM

## 2024-09-23 NOTE — Telephone Encounter (Signed)
 Called back and given below message. Given appt on 12/30 at 4 pm to see Dr. Lonn and given radiology scheduling #. She verbalized understanding.

## 2024-09-23 NOTE — Telephone Encounter (Signed)
 Returned her call and mailbox full unable to leave a message.

## 2024-09-23 NOTE — Telephone Encounter (Signed)
 I will order CT scan to be done in 1 week, schedule appt to see me on 12/30

## 2024-09-23 NOTE — Telephone Encounter (Signed)
 She called back. She completed antibiotic on Monday 12/8. She is still having swelling to left axilla/ armpit area and left side of shoulder going up to her neck to jaw line. The areas feel sore and have a dull ache constantly. She said the areas have a full sensation. She denies fever and states that she feels fine.  She is asking what Dr. Lonn recommends.

## 2024-09-27 ENCOUNTER — Other Ambulatory Visit: Payer: Self-pay | Admitting: Obstetrics and Gynecology

## 2024-09-27 DIAGNOSIS — R2232 Localized swelling, mass and lump, left upper limb: Secondary | ICD-10-CM

## 2024-09-28 ENCOUNTER — Other Ambulatory Visit: Payer: Self-pay | Admitting: Obstetrics and Gynecology

## 2024-09-28 ENCOUNTER — Inpatient Hospital Stay
Admission: RE | Admit: 2024-09-28 | Discharge: 2024-09-28 | Attending: Obstetrics and Gynecology | Admitting: Obstetrics and Gynecology

## 2024-09-28 DIAGNOSIS — R599 Enlarged lymph nodes, unspecified: Secondary | ICD-10-CM

## 2024-09-28 DIAGNOSIS — R2232 Localized swelling, mass and lump, left upper limb: Secondary | ICD-10-CM

## 2024-09-29 ENCOUNTER — Ambulatory Visit (HOSPITAL_COMMUNITY): Admission: RE | Admit: 2024-09-29

## 2024-09-29 DIAGNOSIS — C8307 Small cell B-cell lymphoma, spleen: Secondary | ICD-10-CM | POA: Diagnosis present

## 2024-09-29 MED ORDER — IOHEXOL 300 MG/ML  SOLN
75.0000 mL | Freq: Once | INTRAMUSCULAR | Status: AC | PRN
  Administered 2024-09-29: 08:00:00 75 mL via INTRAVENOUS

## 2024-09-30 ENCOUNTER — Other Ambulatory Visit (HOSPITAL_COMMUNITY)

## 2024-10-07 ENCOUNTER — Ambulatory Visit
Admission: RE | Admit: 2024-10-07 | Discharge: 2024-10-07 | Disposition: A | Discharge status: Home / Self Care | Source: Ambulatory Visit | Attending: Obstetrics and Gynecology | Admitting: Obstetrics and Gynecology

## 2024-10-07 ENCOUNTER — Other Ambulatory Visit (HOSPITAL_COMMUNITY)
Admission: RE | Admit: 2024-10-07 | Discharge: 2024-10-07 | Disposition: A | Source: Ambulatory Visit | Attending: Obstetrics and Gynecology | Admitting: Obstetrics and Gynecology

## 2024-10-07 DIAGNOSIS — R2232 Localized swelling, mass and lump, left upper limb: Secondary | ICD-10-CM

## 2024-10-07 DIAGNOSIS — R599 Enlarged lymph nodes, unspecified: Secondary | ICD-10-CM | POA: Insufficient documentation

## 2024-10-11 ENCOUNTER — Telehealth: Payer: Self-pay | Admitting: *Deleted

## 2024-10-11 ENCOUNTER — Inpatient Hospital Stay: Admitting: Hematology and Oncology

## 2024-10-11 ENCOUNTER — Other Ambulatory Visit

## 2024-10-11 LAB — SURGICAL PATHOLOGY

## 2024-10-11 NOTE — Telephone Encounter (Signed)
 Contacted patient per Dr. Lonn with following information: Pathology results from 10/07/24 biopsy not available yet so today's appt to discuss results needs to be rescheduled for next week. [name] verbalized understanding of information.  Appt now scheduled next week on Tuesday 10/18/24 @ 1000.

## 2024-10-12 ENCOUNTER — Telehealth: Payer: Self-pay | Admitting: Oncology

## 2024-10-12 NOTE — Telephone Encounter (Signed)
 Called Kara Perkins and scheduled an appointment on 10/14/24 at 8:00 to discuss biopsy results with Dr. Lonn.  Kara Perkins would like to go ahead and schedule for 1/2 but needs to ask her work.  She will call back if she needs to cancel it and keep the Tuesday appointment.

## 2024-10-14 ENCOUNTER — Other Ambulatory Visit: Payer: Self-pay | Admitting: Hematology and Oncology

## 2024-10-14 ENCOUNTER — Inpatient Hospital Stay: Attending: Hematology and Oncology | Admitting: Hematology and Oncology

## 2024-10-14 ENCOUNTER — Encounter: Payer: Self-pay | Admitting: Hematology and Oncology

## 2024-10-14 VITALS — BP 141/74 | HR 76 | Temp 99.6°F | Resp 18 | Ht 66.0 in | Wt 206.0 lb

## 2024-10-14 DIAGNOSIS — C8338 Diffuse large B-cell lymphoma, lymph nodes of multiple sites: Secondary | ICD-10-CM | POA: Diagnosis not present

## 2024-10-14 DIAGNOSIS — R12 Heartburn: Secondary | ICD-10-CM | POA: Insufficient documentation

## 2024-10-14 DIAGNOSIS — Z5112 Encounter for antineoplastic immunotherapy: Secondary | ICD-10-CM | POA: Insufficient documentation

## 2024-10-14 DIAGNOSIS — E860 Dehydration: Secondary | ICD-10-CM | POA: Insufficient documentation

## 2024-10-14 DIAGNOSIS — Z5111 Encounter for antineoplastic chemotherapy: Secondary | ICD-10-CM | POA: Insufficient documentation

## 2024-10-14 DIAGNOSIS — C8330 Diffuse large B-cell lymphoma, unspecified site: Secondary | ICD-10-CM | POA: Insufficient documentation

## 2024-10-14 DIAGNOSIS — Z5189 Encounter for other specified aftercare: Secondary | ICD-10-CM | POA: Insufficient documentation

## 2024-10-14 DIAGNOSIS — D61818 Other pancytopenia: Secondary | ICD-10-CM | POA: Insufficient documentation

## 2024-10-14 MED ORDER — LIDOCAINE-PRILOCAINE 2.5-2.5 % EX CREA: TOPICAL_CREAM | CUTANEOUS | 3 refills | Status: AC

## 2024-10-14 MED ORDER — PROCHLORPERAZINE MALEATE 10 MG PO TABS: 10.0000 mg | ORAL_TABLET | Freq: Four times a day (QID) | ORAL | 1 refills | Status: DC | PRN

## 2024-10-14 MED ORDER — ALLOPURINOL 300 MG PO TABS: 300.0000 mg | ORAL_TABLET | Freq: Every day | ORAL | 3 refills | Status: DC

## 2024-10-14 MED ORDER — SULFAMETHOXAZOLE-TRIMETHOPRIM 800-160 MG PO TABS: 1.0000 | ORAL_TABLET | ORAL | 5 refills | Status: DC

## 2024-10-14 MED ORDER — PREDNISONE 20 MG PO TABS: 60.0000 mg | ORAL_TABLET | Freq: Every day | ORAL | 5 refills | Status: AC

## 2024-10-14 MED ORDER — ACYCLOVIR 400 MG PO TABS: 400.0000 mg | ORAL_TABLET | Freq: Two times a day (BID) | ORAL | 5 refills | Status: AC

## 2024-10-14 MED ORDER — ONDANSETRON HCL 8 MG PO TABS: 8.0000 mg | ORAL_TABLET | Freq: Three times a day (TID) | ORAL | 1 refills | Status: DC | PRN

## 2024-10-14 NOTE — Progress Notes (Signed)
 [facility] OFFICE PROGRESS NOTE  Patient Care Team: Jeffie Cheryl BRAVO, MD as PCP - General (Family Medicine) Lonn Hicks, MD as Consulting Physician (Hematology and Oncology)  Assessment & Plan Diffuse large B-cell lymphoma of lymph nodes of multiple regions [facility]) 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 and received 6 cycles of rituximab  and cladribine  with complete response Subsequently, she had disease relapse and received single agent rituximab  with complete response and is under surveillance only Recently, she started to notice discomfort on her axilla and CT imaging confirmed multiple lymphadenopathy, biopsy came back diffuse large B-cell lymphoma  I gave the patient a copy of the pathology report I will get pathologist to order a FISH test to rule out double hit lymphoma I will order PET/CT imaging for staging I will order port placement, chemo education class and echocardiogram prior to starting her on treatment Depending on results of the PET/CT imaging, we might not have to do bone marrow biopsy If she has double hit lymphoma, treatment will be dose adjusted R-EPOCH If she does not have double hit lymphoma, treatment will be polatuzumab with R CHP We discussed risk, benefits, side effects of each option and she agreed to proceed I am hopeful we will have all test results before we start her on treatment in 2 weeks  Orders Placed This Encounter  Procedures   Consent Attestation for Oncology Treatment    The patient is informed of risks, benefits, side-effects of the prescribed oncology treatment. Potential short term and long term side effects and response rates discussed. After a long discussion, the patient made informed decision to proceed.:   Yes   IR IMAGING GUIDED PORT INSERTION    Standing Status:   Future    Expected Date:   10/21/2024    Expiration Date:   10/14/2025    Reason for  Exam (SYMPTOM  OR DIAGNOSIS REQUIRED):   need port for chemo on 1/16    Preferred Imaging Location?:   [facility]   CBC with Differential (Cancer Center Only)    Standing Status:   Future    Expected Date:   10/28/2024    Expiration Date:   10/28/2025   CMP (Cancer Center only)    Standing Status:   Future    Expected Date:   10/28/2024    Expiration Date:   10/28/2025   CBC with Differential (Cancer Center Only)    Standing Status:   Future    Expected Date:   11/18/2024    Expiration Date:   11/18/2025   CMP (Cancer Center only)    Standing Status:   Future    Expected Date:   11/18/2024    Expiration Date:   11/18/2025   CBC with Differential (Cancer Center Only)    Standing Status:   Future    Expected Date:   12/09/2024    Expiration Date:   12/09/2025   CMP (Cancer Center only)    Standing Status:   Future    Expected Date:   12/09/2024    Expiration Date:   12/09/2025   CBC with Differential (Cancer Center Only)    Standing Status:   Future    Expected Date:   12/30/2024    Expiration Date:   12/30/2025   CMP (Cancer Center only)    Standing Status:   Future    Expected Date:   12/30/2024    Expiration Date:  12/30/2025   CBC with Differential (Cancer Center Only)    Standing Status:   Future    Expected Date:   01/20/2025    Expiration Date:   01/20/2026   CMP (Cancer Center only)    Standing Status:   Future    Expected Date:   01/20/2025    Expiration Date:   01/20/2026   CBC with Differential (Cancer Center Only)    Standing Status:   Future    Expected Date:   02/10/2025    Expiration Date:   02/10/2026   CMP (Cancer Center only)    Standing Status:   Future    Expected Date:   02/10/2025    Expiration Date:   02/10/2026   CBC with Differential (Cancer Center Only)    Standing Status:   Future    Expected Date:   03/03/2025    Expiration Date:   03/03/2026   CBC with Differential (Cancer Center Only)    Standing Status:   Future    Expected Date:   03/24/2025     Expiration Date:   03/24/2026   Hepatitis B surface antigen    Standing Status:   Standing    Number of Occurrences:   1    Expiration Date:   10/14/2025   Hepatitis B core antibody, total    Standing Status:   Standing    Number of Occurrences:   1    Expiration Date:   10/14/2025   Sedimentation rate    Standing Status:   Future    Expiration Date:   10/14/2025   Lactate dehydrogenase    Standing Status:   Standing    Number of Occurrences:   5    Expiration Date:   10/14/2025   Uric acid    Standing Status:   Standing    Number of Occurrences:   5    Expiration Date:   10/14/2025   HIV antibody (with reflex)    Standing Status:   Future    Expiration Date:   10/14/2025   PHYSICIAN COMMUNICATION ORDER    Hepatitis B Virus screening with HBsAg and anti-HBc recommended prior to treatment with rituximab , ofatumumab, or obinutuzumab.   ONCBCN PHYSICIAN COMMUNICATION 1    A baseline Echo/ Muga should be obtained prior to initiation of Anthracycline Chemotherapy   ECHOCARDIOGRAM COMPLETE    Standing Status:   Future    Expected Date:   10/21/2024    Expiration Date:   10/14/2025    Perflutren DEFINITY (image enhancing agent) should be administered unless hypersensitivity or allergy exist:   Administer Perflutren    Reason for exam-Echo:   Chemo  Z09    Where [facility] this test [facility] performed[facility]    Does the patient weigh less than or greater than 250 lbs?:   Patient weighs less than 250 lbs   ABO/Rh    Standing Status:   Future    Expiration Date:   10/14/2025     Almarie Bedford, MD  INTERVAL HISTORY: she returns for surveillance follow-up and review of her recent biopsy result I reviewed everything with the patient and discussed treatment options and side effects related to treatment  PHYSICAL EXAMINATION: ECOG PERFORMANCE STATUS: 1 - Symptomatic but completely ambulatory  Vitals:   10/14/24 0813  BP: (!) 141/74  Pulse: 76  Resp: 18  Temp: 99.6 F (37.6 C)  SpO2: 100%    Filed Weights   10/14/24 0813  Weight: 206 lb (93.4 kg)    Relevant data reviewed during this visit included CT imaging, pathology report

## 2024-10-14 NOTE — Assessment & Plan Note (Addendum)
 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 and received 6 cycles of rituximab  and cladribine  with complete response Subsequently, she had disease relapse and received single agent rituximab  with complete response and is under surveillance only Recently, she started to notice discomfort on her axilla and CT imaging confirmed multiple lymphadenopathy, biopsy came back diffuse large B-cell lymphoma  I gave the patient a copy of the pathology report I will get pathologist to order a FISH test to rule out double hit lymphoma I will order PET/CT imaging for staging I will order port placement, chemo education class and echocardiogram prior to starting her on treatment Depending on results of the PET/CT imaging, we might not have to do bone marrow biopsy If she has double hit lymphoma, treatment will be dose adjusted R-EPOCH If she does not have double hit lymphoma, treatment will be polatuzumab with R CHP We discussed risk, benefits, side effects of each option and she agreed to proceed I am hopeful we will have all test results before we start her on treatment in 2 weeks

## 2024-10-16 ENCOUNTER — Other Ambulatory Visit: Payer: Self-pay

## 2024-10-17 ENCOUNTER — Encounter: Payer: Self-pay | Admitting: Hematology and Oncology

## 2024-10-18 ENCOUNTER — Telehealth: Payer: Self-pay

## 2024-10-18 ENCOUNTER — Inpatient Hospital Stay: Admitting: Hematology and Oncology

## 2024-10-18 NOTE — Telephone Encounter (Signed)
 Called and given appt for ECHO tomorrow at 3 pm, arrive at 2:45 pm, given address. She is aware of appt.

## 2024-10-19 ENCOUNTER — Ambulatory Visit (HOSPITAL_COMMUNITY)
Admission: RE | Admit: 2024-10-19 | Discharge: 2024-10-19 | Disposition: A | Discharge status: Home / Self Care | Source: Ambulatory Visit | Attending: Internal Medicine | Admitting: Internal Medicine

## 2024-10-19 DIAGNOSIS — C8338 Diffuse large B-cell lymphoma, lymph nodes of multiple sites: Secondary | ICD-10-CM | POA: Insufficient documentation

## 2024-10-19 LAB — ECHOCARDIOGRAM COMPLETE
Area-P 1/2: 3.28 cm2
S' Lateral: 3.1 cm

## 2024-10-20 ENCOUNTER — Encounter: Payer: Self-pay | Admitting: Hematology and Oncology

## 2024-10-20 NOTE — Progress Notes (Addendum)
 Pharmacist Chemotherapy Monitoring - Initial Assessment    Anticipated start date: 10/27/24   The following has been reviewed per standard work regarding the patient's treatment regimen: The patient's diagnosis, treatment plan and drug doses, and organ/hematologic function Lab orders and baseline tests specific to treatment regimen  The treatment plan start date, drug sequencing, and pre-medications Prior authorization status  Patient's documented medication list, including drug-drug interaction screen and prescriptions for anti-emetics and supportive care specific to the treatment regimen The drug concentrations, fluid compatibility, administration routes, and timing of the medications to be used The patient's access for treatment and lifetime cumulative dose history, if applicable  The patient's medication allergies and previous infusion related reactions, if applicable   Changes made to treatment plan:  treatment plan date  Follow up needed:  Hepatitis B surface antigen & core antibody to be collected Pending auth for treatment Port placement scheduled for 10/26/24 Ensure patient has completed previous Bactrim  script from 2025 & remove from med list Previously received Solumedrol 40 mg as an additional premed when getting brand Rituxan  in the past. SM to Dr. Lonn asking if would like to add as a premed FISH results to rule out double hit lymphoma - DA-R-EPOCH vs POLA-R-CHP  Kara Perkins, RPH, 10/20/2024  2:38 PM   ADDENDUM: No solumedrol premed per Dr. Lonn as patient is already receiving steroids with treatment.  Kara Perkins, PharmD Oncology Infusion Pharmacist 10/20/2024 3:08 PM

## 2024-10-24 ENCOUNTER — Inpatient Hospital Stay

## 2024-10-24 ENCOUNTER — Telehealth: Payer: Self-pay

## 2024-10-24 ENCOUNTER — Ambulatory Visit (HOSPITAL_COMMUNITY)
Admission: RE | Admit: 2024-10-24 | Discharge: 2024-10-24 | Disposition: A | Discharge status: Home / Self Care | Source: Ambulatory Visit | Attending: Hematology and Oncology | Admitting: Hematology and Oncology

## 2024-10-24 DIAGNOSIS — C8338 Diffuse large B-cell lymphoma, lymph nodes of multiple sites: Secondary | ICD-10-CM | POA: Diagnosis present

## 2024-10-24 LAB — CBC WITH DIFFERENTIAL (CANCER CENTER ONLY)
Abs Immature Granulocytes: 0.01 K/uL (ref 0.00–0.07)
Basophils Absolute: 0 K/uL (ref 0.0–0.1)
Basophils Relative: 0 %
Eosinophils Absolute: 0.2 K/uL (ref 0.0–0.5)
Eosinophils Relative: 3 %
HCT: 37.8 % (ref 36.0–46.0)
Hemoglobin: 12.9 g/dL (ref 12.0–15.0)
Immature Granulocytes: 0 %
Lymphocytes Relative: 24 %
Lymphs Abs: 1.5 K/uL (ref 0.7–4.0)
MCH: 28.7 pg (ref 26.0–34.0)
MCHC: 34.1 g/dL (ref 30.0–36.0)
MCV: 84.2 fL (ref 80.0–100.0)
Monocytes Absolute: 0.3 K/uL (ref 0.1–1.0)
Monocytes Relative: 4 %
Neutro Abs: 4.3 K/uL (ref 1.7–7.7)
Neutrophils Relative %: 69 %
Platelet Count: 203 K/uL (ref 150–400)
RBC: 4.49 MIL/uL (ref 3.87–5.11)
RDW: 14.2 % (ref 11.5–15.5)
WBC Count: 6.2 K/uL (ref 4.0–10.5)
nRBC: 0 % (ref 0.0–0.2)

## 2024-10-24 LAB — CMP (CANCER CENTER ONLY)
ALT: 27 U/L (ref 0–44)
AST: 21 U/L (ref 15–41)
Albumin: 4.5 g/dL (ref 3.5–5.0)
Alkaline Phosphatase: 95 U/L (ref 38–126)
Anion gap: 10 (ref 5–15)
BUN: 14 mg/dL (ref 6–20)
CO2: 28 mmol/L (ref 22–32)
Calcium: 9.4 mg/dL (ref 8.9–10.3)
Chloride: 101 mmol/L (ref 98–111)
Creatinine: 1.02 mg/dL — ABNORMAL HIGH (ref 0.44–1.00)
GFR, Estimated: >60 mL/min
Glucose, Bld: 88 mg/dL (ref 70–99)
Potassium: 4.2 mmol/L (ref 3.5–5.1)
Sodium: 139 mmol/L (ref 135–145)
Total Bilirubin: 0.8 mg/dL (ref 0.0–1.2)
Total Protein: 7.1 g/dL (ref 6.5–8.1)

## 2024-10-24 LAB — GLUCOSE, CAPILLARY: Glucose-Capillary: 85 mg/dL (ref 70–99)

## 2024-10-24 LAB — URIC ACID: Uric Acid, Serum: 6.9 mg/dL (ref 2.5–7.1)

## 2024-10-24 LAB — HIV ANTIBODY (ROUTINE TESTING W REFLEX): HIV Screen 4th Generation wRfx: NONREACTIVE

## 2024-10-24 LAB — LACTATE DEHYDROGENASE: LDH: 198 U/L (ref 105–235)

## 2024-10-24 LAB — HEPATITIS B SURFACE ANTIGEN: Hepatitis B Surface Ag: NONREACTIVE

## 2024-10-24 MED ORDER — FLUDEOXYGLUCOSE F - 18 (FDG) INJECTION
10.2900 | Freq: Once | INTRAVENOUS | Status: AC
  Administered 2024-10-24: 10.29 via INTRAVENOUS

## 2024-10-24 NOTE — Telephone Encounter (Signed)
 Called and no answer/ voicemail full. Per Dr. Lonn, FISH is negative for double HIT lymphoma so we are good with current chemo.  Take prednisone  for 5 days starting morning of chemo. Will attempt call later.

## 2024-10-24 NOTE — Telephone Encounter (Signed)
 Called her back and reviewed appt times.

## 2024-10-24 NOTE — Telephone Encounter (Signed)
 Spoke with in the lobby and given message from Dr. Lonn, FISH is negative for double HIT lymphoma so we are good with current chemo.  Take prednisone  for 5 days starting morning of chemo. She verbalized understanding.

## 2024-10-25 ENCOUNTER — Other Ambulatory Visit: Payer: Self-pay | Admitting: Radiology

## 2024-10-25 LAB — HEPATITIS B CORE ANTIBODY, TOTAL: HEP B CORE AB: NEGATIVE

## 2024-10-25 NOTE — H&P (Signed)
 "  Chief Complaint: Diffuse large B-cell lymphoma; referred for Port-A-Cath placement to assist with treatment.  Referring Provider(s): Gorsuch,N  Supervising Physician: Vanice Revel  Patient Status: [facility] - Out-pt  History of Present Illness: Kara Perkins is a 54 y.o. 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 Subsequently, she had disease relapse and received single agent rituximab  with complete response and was under surveillance only. 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  She presents today for Port-A-Cath placement to assist with treatment.  Patient known to IR team from bone marrow biopsy in 2019.  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/11/2014   Perimenopausal menorrhagia 07/26/2013   PONV (postoperative nausea and vomiting) 12/11/2021   Positive Coombs test    Positive Coombs test 11/05/2011    Past Surgical History:  Procedure Laterality Date   BIOPSY  07/02/2020   Procedure: BIOPSY;  Surgeon: Shelah Lamar RAMAN, MD;  Location: [facility];  Service: Cardiopulmonary;;   BRONCHIAL BRUSHINGS  07/02/2020   Procedure: BRONCHIAL BRUSHINGS;  Surgeon: Shelah Lamar RAMAN, MD;  Location: [facility];  Service: Cardiopulmonary;;   BRONCHIAL WASHINGS  07/02/2020   Procedure: BRONCHIAL WASHINGS;  Surgeon: Shelah Lamar RAMAN, MD;  Location: [facility];  Service: Cardiopulmonary;;   CARPAL TUNNEL RELEASE Right 10/23/2021   Procedure: CARPAL TUNNEL RELEASE;  Surgeon: Mardee Lynwood SQUIBB, MD;  Location: [facility];  Service:  Orthopedics;  Laterality: Right;   CARPAL TUNNEL RELEASE Left 12/11/2021   Procedure: Left carpal tunnel release;  Surgeon: Mardee Lynwood SQUIBB, MD;  Location: [facility];  Service: Orthopedics;  Laterality: Left;   CHOLECYSTECTOMY     DILATATION & CURETTAGE/HYSTEROSCOPY WITH MYOSURE N/A 12/06/2016   Procedure: DILATATION & CURETTAGE/HYSTEROSCOPY WITH MYOSURE;  Surgeon: Lynwood Clubs, MD;  Location: [facility];  Service: Gynecology;  Laterality: N/A;  HOLD MYSOSURE ITEMS   VIDEO BRONCHOSCOPY Bilateral 07/02/2020   Procedure: VIDEO BRONCHOSCOPY WITH FLUORO;  Surgeon: Shelah Lamar RAMAN, MD;  Location: [facility];  Service: Cardiopulmonary;  Laterality: Bilateral;    Allergies: Other  Medications: Prior to Admission medications  Medication Sig Start Date End Date Taking? Authorizing Provider  acetaminophen  (TYLENOL ) 500 MG tablet Take 1,000 mg by mouth every 6 (six) hours as needed (pain.).    [provider]  acyclovir  (ZOVIRAX ) 400 MG tablet Take 1 tablet (400 mg total) by mouth 2 (two) times daily. 10/14/24   Lonn Hicks, MD  allopurinol  (ZYLOPRIM ) 300 MG tablet Take 1 tablet (300 mg total) by mouth daily. 10/14/24   Lonn Hicks, MD  amLODipine (NORVASC) 10 MG tablet Take 10 mg by mouth daily. 03/04/24   [provider]  cholecalciferol (VITAMIN D3) 25 MCG (1000 UNIT) tablet Take 2,000 Units by mouth daily.    [provider]  ibuprofen (ADVIL) 400 MG tablet Take 400 mg by mouth every 6 (six) hours as needed for moderate pain.    [provider]  lidocaine -prilocaine  (EMLA ) cream Apply to affected area once 10/14/24   Lonn Hicks, MD  ondansetron  (ZOFRAN ) 8 MG tablet Take 1 tablet (8 mg total) by mouth every 8 (eight) hours  as needed for nausea or vomiting. Start on the third day after chemotherapy. 10/14/24   Lonn Hicks, MD  predniSONE  (DELTASONE ) 20 MG tablet Take 3 tablets (60 mg total) by mouth daily. Take with food for 5 days every 21 days 10/14/24   Lonn Hicks, MD   prochlorperazine  (COMPAZINE ) 10 MG tablet Take 1 tablet (10 mg total) by mouth every 6 (six) hours as needed for nausea or vomiting. 10/14/24   Lonn Hicks, MD  sulfamethoxazole -trimethoprim  (BACTRIM  DS) 800-160 MG tablet Take 1 tablet by mouth 2 (two) times daily. 09/12/24   Lonn Hicks, MD  sulfamethoxazole -trimethoprim  (BACTRIM  DS) 800-160 MG tablet Take 1 tablet by mouth 3 (three) times a week. 10/14/24   Lonn Hicks, MD     No family history on file.  Social History   Socioeconomic History   Marital status: Married    Spouse name: Not on file   Number of children: Not on file   Years of education: Not on file   Highest education level: Not on file  Occupational History   Not on file  Tobacco Use   Smoking status: Never   Smokeless tobacco: Never  Vaping Use   Vaping status: Never Used  Substance and Sexual Activity   Alcohol use: No    Alcohol/week: 0.0 standard drinks of alcohol   Drug use: No   Sexual activity: Yes  Other Topics Concern   Not on file  Social History Narrative   Not on file   Social Drivers of Health   Tobacco Use: Low Risk (09/09/2024)   Patient History    Smoking Tobacco Use: Never    Smokeless Tobacco Use: Never    Passive Exposure: Not on file  Financial Resource Strain: Not on file  Food Insecurity: Not on file  Transportation Needs: Not on file  Physical Activity: Not on file  Stress: Not on file  Social Connections: Not on file  Depression (EYV7-0): Not on file  Alcohol Screen: Not on file  Housing: Not on file  Utilities: Not on file  Health Literacy: Not on file       Review of Systems  Vital Signs: LMP 12/11/2016   Advance Care Plan: No documents on file  Physical Exam  Imaging: ECHOCARDIOGRAM COMPLETE Result Date: 10/19/2024    ECHOCARDIOGRAM REPORT   Patient Name:   Kara Perkins Date of Exam: 10/19/2024 Medical Rec #:  [id number]          Height:       66.0 in Accession #:    [id number]         Weight:       206.0 lb  Date of Birth:  08-18-1971           BSA:          2.025 m Patient Age:    54 years           BP:           141/74 mmHg Patient Gender: F                  HR:           70 bpm. Exam Location:  [address] Procedure: 2D Echo, 3D Echo, Cardiac Doppler, Color Doppler and Strain Analysis            (Both Spectral and Color Flow Doppler were utilized during            procedure). Indications:    C83.38  B-Cell Lymph  History:        Patient has no prior history of Echocardiogram examinations.  Sonographer:    Powell Saras Referring Phys: NI GORSUCH IMPRESSIONS  1. Left ventricular ejection fraction, by estimation, is 60 to 65%. Left ventricular ejection fraction by 3D volume is 58 %. The left ventricle has normal function. The left ventricle has no regional wall motion abnormalities. Left ventricular diastolic  parameters are consistent with Grade I diastolic dysfunction (impaired relaxation). The average left ventricular global longitudinal strain is -20.9 %. The global longitudinal strain is normal.  2. Right ventricular systolic function is normal. The right ventricular size is normal.  3. The mitral valve is normal in structure. No evidence of mitral valve regurgitation. No evidence of mitral stenosis.  4. The aortic valve is normal in structure. Aortic valve regurgitation is not visualized. No aortic stenosis is present.  5. The inferior vena cava is normal in size with greater than 50% respiratory variability, suggesting right atrial pressure of 3 mmHg. Comparison(s): No prior Echocardiogram. FINDINGS  Left Ventricle: Left ventricular ejection fraction, by estimation, is 60 to 65%. Left ventricular ejection fraction by 3D volume is 58 %. The left ventricle has normal function. The left ventricle has no regional wall motion abnormalities. The average left ventricular global longitudinal strain is -20.9 %. Strain was performed and the global longitudinal strain is normal. The left ventricular internal cavity size  was normal in size. There is no left ventricular hypertrophy. Left ventricular diastolic parameters are consistent with Grade I diastolic dysfunction (impaired relaxation). Right Ventricle: The right ventricular size is normal. No increase in right ventricular wall thickness. Right ventricular systolic function is normal. Left Atrium: Left atrial size was normal in size. Right Atrium: Right atrial size was normal in size. Pericardium: There is no evidence of pericardial effusion. Mitral Valve: The mitral valve is normal in structure. No evidence of mitral valve regurgitation. No evidence of mitral valve stenosis. Tricuspid Valve: The tricuspid valve is normal in structure. Tricuspid valve regurgitation is not demonstrated. No evidence of tricuspid stenosis. Aortic Valve: The aortic valve is normal in structure. Aortic valve regurgitation is not visualized. No aortic stenosis is present. Pulmonic Valve: The pulmonic valve was not well visualized. Pulmonic valve regurgitation is not visualized. No evidence of pulmonic stenosis. Aorta: The aortic root is normal in size and structure. Venous: The inferior vena cava is normal in size with greater than 50% respiratory variability, suggesting right atrial pressure of 3 mmHg. IAS/Shunts: No atrial level shunt detected by color flow Doppler.  LEFT VENTRICLE PLAX 2D LVIDd:         4.40 cm         Diastology LVIDs:         3.10 cm         LV e' medial:    5.29 cm/s LV PW:         1.00 cm         LV E/e' medial:  14.4 LV IVS:        1.10 cm         LV e' lateral:   9.07 cm/s LVOT diam:     2.10 cm         LV E/e' lateral: 8.4 LV SV:         64 LV SV Index:   32              2D Longitudinal LVOT Area:     3.46  cm        Strain                                2D Strain GLS   -19.6 %                                (A4C):                                2D Strain GLS   -20.4 %                                (A3C):                                2D Strain GLS   -22.6 %                                 (A2C):                                2D Strain GLS   -20.9 %                                Avg:                                 3D Volume EF                                LV 3D EF:    Left                                             ventricul                                             ar                                             ejection                                             fraction                                             by 3D  volume is                                             58 %.                                 3D Volume EF:                                3D EF:        58 %                                LV EDV:       120 ml                                LV ESV:       51 ml                                LV SV:        70 ml RIGHT VENTRICLE             IVC RV Basal diam:  2.60 cm     IVC diam: 1.20 cm RV S prime:     11.30 cm/s TAPSE (M-mode): 2.2 cm LEFT ATRIUM             Index        RIGHT ATRIUM           Index LA diam:        4.00 cm 1.98 cm/m   RA Area:     11.90 cm LA Vol (A2C):   40.8 ml 20.15 ml/m  RA Volume:   25.10 ml  12.39 ml/m LA Vol (A4C):   54.5 ml 26.91 ml/m LA Biplane Vol: 49.2 ml 24.30 ml/m  AORTIC VALVE LVOT Vmax:   99.70 cm/s LVOT Vmean:  57.700 cm/s LVOT VTI:    0.185 m  AORTA Ao Root diam: 2.90 cm Ao Asc diam:  3.00 cm MITRAL VALVE MV Area (PHT): 3.28 cm    SHUNTS MV Decel Time: 231 msec    Systemic VTI:  0.18 m MV E velocity: 76.20 cm/s  Systemic Diam: 2.10 cm MV A velocity: 73.60 cm/s MV E/A ratio:  1.04 Franck Azobou Tonleu Electronically signed by Joelle Cedars Tonleu Signature Date/Time: 10/19/2024/6:41:38 PM    Final    US  AXILLARY NODE CORE BIOPSY LEFT Addendum Date: 10/12/2024 ADDENDUM REPORT: 10/12/2024 06:59 ADDENDUM: PATHOLOGY revealed: Lymph node, needle/core biopsy, left axilla, hydromark spiral clip - DIFFUSE LARGE B-CELL LYMPHOMA Pathology results are CONCORDANT with imaging findings,  per Dr. Curtistine Noble. Pathology results and recommendations below were discussed with patient by telephone. Patient reported biopsy site doing well with no adverse symptoms, and only slight tenderness at the site. Post biopsy care instructions were reviewed, questions were answered and my direct phone number was provided. Patient was instructed to call [facility] for any additional questions or concerns related to biopsy site. RECOMMENDATION: Clinical follow up with lymphoma doctor/oncologist. Epic In Basket message was  sent to notify Dr. Almarie Bedford. Pathology results reported by Mliss CHARM Molt RN 10/11/2024. Electronically Signed   By: Curtistine Noble   On: 10/12/2024 06:59   Result Date: 10/12/2024 CLINICAL DATA:  54 year old female with prior history of lymphoma here for a left axilla ultrasound-guided biopsy for a suspicious lymph node. EXAM: US  AXILLARY NODE CORE BIOPSY LEFT COMPARISON:  Previous exam(s). PROCEDURE: The procedure was discussed with the patient including benefits and alternatives. We discussed the risks of the procedure, including infection, bleeding, tissue injury and inadequate sampling. Post biopsy clip placement and possible clip migration were also discussed. Informed written consent was given. The usual time-out protocol was performed immediately prior to the procedure. Left axilla: The patient was scanned and the area of interest was localized which correlates with the area of concern seen on prior imaging studies. This area was targeted for ultrasound guided core needle biopsy. After sterile skin prep and 1% lidocaine  with and without epinephrine  for local anesthesia, a 14 gauge spring-loaded biopsy needle was used under direct ultrasound visualization to obtain several cores of tissue from the mass using a lateral to medial approach. Next, under ultrasound guidance, a spiral HydroMARK clip was placed in the sampled mass. There were no immediate  post-procedure complications. A follow up mammogram was performed and dictated separately. IMPRESSION: Ultrasound guided biopsy of the left axilla as above. No apparent complications. Electronically Signed: By: Curtistine Noble On: 10/07/2024 08:34   MM CLIP PLACEMENT LEFT Result Date: 10/07/2024 CLINICAL DATA:  54 year old female status post left axillary ultrasound-guided biopsy. EXAM: 3D DIAGNOSTIC LEFT MAMMOGRAM POST ULTRASOUND BIOPSY COMPARISON:  Previous exam(s). ACR Breast Density Category b: There are scattered areas of fibroglandular density. FINDINGS: 3D Mammographic images were obtained following ultrasound guided biopsy of the left axilla. The biopsy marking clip is in expected position at the site of biopsy. IMPRESSION: Appropriate positioning of the HydroMARK spiral shaped biopsy marking clip at the site of biopsy in the left axilla. Final Assessment: Post Procedure Mammograms for Marker Placement Electronically Signed   By: Curtistine Noble   On: 10/07/2024 08:57   CT Chest W Contrast Result Date: 10/06/2024 CLINICAL DATA:  Lymphoma with left axillary tenderness and fullness. * Tracking Code: BO * EXAM: CT CHEST WITH CONTRAST TECHNIQUE: Multidetector CT imaging of the chest was performed during intravenous contrast administration. RADIATION DOSE REDUCTION: This exam was performed according to the departmental dose-optimization program which includes automated exposure control, adjustment of the mA and/or kV according to patient size and/or use of iterative reconstruction technique. CONTRAST:  75mL OMNIPAQUE  IOHEXOL  300 MG/ML  SOLN COMPARISON:  09/25/2020. FINDINGS: Cardiovascular: Heart is at the upper limits of normal in size to mildly enlarged. No pericardial effusion. Mediastinum/Nodes: Small incidentally imaged cervical nodes. Numerous thoracic inlet lymph nodes, left greater than right, measuring up to 9 mm (2/13). Somewhat ill-defined mediastinal adenopathy with index subcarinal lymph  node measuring 1.7 cm. Bulky left axillary and left subpectoral adenopathy with index left axillary lymph node measuring 2.1 cm (2/58). Esophagus is grossly unremarkable. Lungs/Pleura: New medial segment right middle lobe nodule measures 7 mm (5 x 8 mm, 5/74). New 7 mm nodule in the inferomedial left lower lobe (5/129). No pleural fluid. Airway is unremarkable. Upper Abdomen: Cholecystectomy. Spleen is likely mildly enlarged but is incompletely imaged. Visualized portions of the liver, adrenal glands, kidneys, spleen, pancreas, stomach and bowel are otherwise grossly unremarkable. No upper abdominal adenopathy. Musculoskeletal: Degenerative changes in the spine. IMPRESSION: 1. Thoracic inlet, mediastinal and left  subpectoral/left axillary adenopathy, compatible with lymphoma. 2. New right middle and left lower lobe nodules may be due to lymphoma. 3. Spleen is likely enlarged but is incompletely imaged. Electronically Signed   By: Newell Eke M.D.   On: 10/06/2024 14:08   MM 3D DIAGNOSTIC MAMMOGRAM BILATERAL BREAST Result Date: 09/28/2024 CLINICAL DATA:  54 year old female presents for evaluation a left mass since early November. Patient has a history lymphoma. EXAM: DIGITAL DIAGNOSTIC BILATERAL MAMMOGRAM WITH TOMOSYNTHESIS AND CAD; ULTRASOUND LEFT BREAST LIMITED TECHNIQUE: Bilateral digital diagnostic mammography and breast tomosynthesis was performed. The images were evaluated with computer-aided detection. ; Targeted ultrasound examination of the left breast was performed. COMPARISON:  Previous exam(s). ACR Breast Density Category b: There are scattered areas of fibroglandular density. FINDINGS: Right mammogram:  No findings suspicious for malignancy. Left mammogram: Several enlarged lymph nodes underlie the palpable marker about the left axilla. No findings suspicious for malignancy in the breast. Left axilla ultrasound: Targeted imaging of the patient's palpable area of concern demonstrates several  enlarged lymph nodes with diffuse cortical thickening. One lymph node measures to 3.4 cm with no visible hilum. Imaging of the contralateral axilla was unremarkable. IMPRESSION: Suspicious left axillary lymph nodes. RECOMMENDATION: Left axillary ultrasound-guided biopsy. I have discussed the findings and recommendations with the patient. The biopsy procedure was discussed with the patient and questions were answered. Patient expressed their understanding of the biopsy recommendation. Patient will be scheduled for biopsy at her earliest convenience by the schedulers. Ordering provider will be notified. If applicable, a reminder letter will be sent to the patient regarding the next appointment. BI-RADS CATEGORY  4: Suspicious. Electronically Signed   By: Curtistine Noble   On: 09/28/2024 15:46   US  LIMITED ULTRASOUND INCLUDING AXILLA LEFT BREAST  Result Date: 09/28/2024 CLINICAL DATA:  54 year old female presents for evaluation a left mass since early November. Patient has a history lymphoma. EXAM: DIGITAL DIAGNOSTIC BILATERAL MAMMOGRAM WITH TOMOSYNTHESIS AND CAD; ULTRASOUND LEFT BREAST LIMITED TECHNIQUE: Bilateral digital diagnostic mammography and breast tomosynthesis was performed. The images were evaluated with computer-aided detection. ; Targeted ultrasound examination of the left breast was performed. COMPARISON:  Previous exam(s). ACR Breast Density Category b: There are scattered areas of fibroglandular density. FINDINGS: Right mammogram:  No findings suspicious for malignancy. Left mammogram: Several enlarged lymph nodes underlie the palpable marker about the left axilla. No findings suspicious for malignancy in the breast. Left axilla ultrasound: Targeted imaging of the patient's palpable area of concern demonstrates several enlarged lymph nodes with diffuse cortical thickening. One lymph node measures to 3.4 cm with no visible hilum. Imaging of the contralateral axilla was unremarkable. IMPRESSION:  Suspicious left axillary lymph nodes. RECOMMENDATION: Left axillary ultrasound-guided biopsy. I have discussed the findings and recommendations with the patient. The biopsy procedure was discussed with the patient and questions were answered. Patient expressed their understanding of the biopsy recommendation. Patient will be scheduled for biopsy at her earliest convenience by the schedulers. Ordering provider will be notified. If applicable, a reminder letter will be sent to the patient regarding the next appointment. BI-RADS CATEGORY  4: Suspicious. Electronically Signed   By: Curtistine Noble   On: 09/28/2024 15:46    Labs:  CBC: Recent Labs    03/04/24 0807 09/09/24 0953 10/24/24 1519  WBC 4.4 5.0 6.2  HGB 13.7 11.7* 12.9  HCT 38.9 33.8* 37.8  PLT 171 145* 203    COAGS: No results for input(s): INR, APTT in the last 8760 hours.  BMP: Recent Labs  03/04/24 0807 09/09/24 0953 10/24/24 1519  NA 140 138 139  K 4.1 4.0 4.2  CL 103 102 101  CO2 31 27 28   GLUCOSE 105* 103* 88  BUN 14 16 14   CALCIUM 9.4 9.0 9.4  CREATININE 1.00 0.94 1.02*  GFRNONAA >60 >60 >60    LIVER FUNCTION TESTS: Recent Labs    03/04/24 0807 09/09/24 0953 10/24/24 1519  BILITOT 0.8 0.8 0.8  AST 18 21 21   ALT 28 25 27   ALKPHOS 89 94 95  PROT 7.3 6.9 7.1  ALBUMIN 4.8 4.3 4.5    TUMOR MARKERS: No results for input(s): AFPTM, CEA, CA199, CHROMGRNA in the last 8760 hours.  Assessment and Plan: 54 y.o. 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 Subsequently, she had disease relapse and received single agent rituximab  with complete response and was under surveillance only. Recently, she started to notice discomfort in her axilla and CT imaging confirmed multiple lymphadenopathy, biopsy came back diffuse large B-cell  lymphoma.  She presents today for Port-A-Cath placement to assist with treatment.  Patient known to IR team from bone marrow biopsy in 2019.Risks and benefits of image guided port-a-catheter placement was discussed with the patient including, but not limited to bleeding, infection, pneumothorax, or fibrin sheath development and need for additional procedures.  All of the patient's questions were answered, patient is agreeable to proceed. Consent signed and in chart.    Thank you for allowing our service to participate in ODA PLACKE 's care.  Electronically Signed: D. Franky Rakers, PA-C   10/25/2024, 2:31 PM      I spent a total of  20 minutes   in face to face in clinical consultation, greater than 50% of which was counseling/coordinating care for port a cath placement   "

## 2024-10-26 ENCOUNTER — Ambulatory Visit (HOSPITAL_COMMUNITY)
Admission: RE | Admit: 2024-10-26 | Discharge: 2024-10-26 | Disposition: A | Discharge status: Home / Self Care | Source: Ambulatory Visit | Attending: Hematology and Oncology | Admitting: Hematology and Oncology

## 2024-10-26 ENCOUNTER — Encounter (HOSPITAL_COMMUNITY): Payer: Self-pay

## 2024-10-26 DIAGNOSIS — C8338 Diffuse large B-cell lymphoma, lymph nodes of multiple sites: Secondary | ICD-10-CM

## 2024-10-26 DIAGNOSIS — C8334 Diffuse large B-cell lymphoma, lymph nodes of axilla and upper limb: Secondary | ICD-10-CM | POA: Diagnosis present

## 2024-10-26 HISTORY — PX: IR IMAGING GUIDED PORT INSERTION: IMG5740

## 2024-10-26 MED ORDER — HEPARIN SOD (PORK) LOCK FLUSH 100 UNIT/ML IV SOLN
INTRAVENOUS | Status: AC
  Filled 2024-10-26: qty 5

## 2024-10-26 MED ORDER — LIDOCAINE-EPINEPHRINE 1 %-1:100000 IJ SOLN
20.0000 mL | Freq: Once | INTRAMUSCULAR | Status: AC
  Administered 2024-10-26: 15 mL via INTRADERMAL

## 2024-10-26 MED ORDER — FENTANYL CITRATE (PF) 100 MCG/2ML IJ SOLN
INTRAMUSCULAR | Status: AC
  Filled 2024-10-26: qty 2

## 2024-10-26 MED ORDER — LIDOCAINE-EPINEPHRINE 1 %-1:100000 IJ SOLN
INTRAMUSCULAR | Status: AC
  Filled 2024-10-26: qty 20

## 2024-10-26 MED ORDER — SODIUM CHLORIDE 0.9 % IV SOLN: INTRAVENOUS | Status: DC

## 2024-10-26 MED ORDER — HEPARIN SOD (PORK) LOCK FLUSH 100 UNIT/ML IV SOLN
500.0000 [IU] | Freq: Once | INTRAVENOUS | Status: AC
  Administered 2024-10-26: 500 [IU] via INTRAVENOUS

## 2024-10-26 MED ORDER — MIDAZOLAM HCL (PF) 2 MG/2ML IJ SOLN
INTRAMUSCULAR | Status: AC | PRN
  Administered 2024-10-26 (×3): 1 mg via INTRAVENOUS

## 2024-10-26 MED ORDER — MIDAZOLAM HCL 2 MG/2ML IJ SOLN
INTRAMUSCULAR | Status: AC
  Filled 2024-10-26: qty 2

## 2024-10-26 MED ORDER — FENTANYL CITRATE (PF) 100 MCG/2ML IJ SOLN
INTRAMUSCULAR | Status: AC | PRN
  Administered 2024-10-26 (×2): 50 ug via INTRAVENOUS

## 2024-10-26 NOTE — Procedures (Signed)
Interventional Radiology Procedure Note  Procedure: RT internal jugular POWER PORT    Complications: None  Estimated Blood Loss:  MIN  Findings: TIP SVCRA    M. TREVOR Levaeh Vice, MD    

## 2024-10-26 NOTE — Discharge Instructions (Signed)
 Implanted Port Insertion, Care After  The following information offers guidance on how to care for yourself after your procedure. Your health care provider may also give you more specific instructions. If you have problems or questions, contact your health care provider.  What can I expect after the procedure? After the procedure, it is common to have: Discomfort at the port insertion site. Bruising on the skin over the port. This should improve over 3-4 days.   Urgent needs - Interventional Radiology, clinic [phone] (mon-fri 8-5).   Wound - May remove dressing and shower in 24 to 48 hours.  Keep site clean and dry.  Replace with bandaid as needed.  Do not submerge in tub or water until site healing well. If closed with glue, glue will flake off on its own.   If ordered by your provider, may start Emla cream (or any other creams ointments or lotions) in 2 weeks or after incision is healed. Port is ready for use immediately.   After completion of treatment, your provider should have you set up for monthly port flushes.   Follow these instructions at home: [facility] care After your port is placed, you will get a manufacturer's information card. The card has information about your port. Keep this card with you at all times. Take care of the port as told by your health care provider. Ask your health care provider if you or a family member can get training for taking care of the port at home. A home health care nurse will be be available to help care for the port. Make sure to remember what type of port you have. Incision care     Follow instructions from your health care provider about how to take care of your port insertion site. Make sure you: Wash your hands with soap and water for at least 20 seconds before and after you change your bandage (dressing). If soap and water are not available, use hand sanitizer. Change your dressing as told by your health care provider. Leave stitches  (sutures), skin glue, or adhesive strips in place. These skin closures may need to stay in place for 2 weeks or longer. If adhesive strip edges start to loosen and curl up, you may trim the loose edges. Do not remove adhesive strips completely unless your health care provider tells you to do that. Check your port insertion site every day for signs of infection. Check for: Redness, swelling, or pain. Fluid or blood. Warmth. Pus or a bad smell. Activity Return to your normal activities as told by your health care provider. Ask your health care provider what activities are safe for you. You may have to avoid lifting. Ask your health care provider how much you can safely lift. General instructions Take over-the-counter and prescription medicines only as told by your health care provider. Do not take baths, swim, or use a hot tub until your health care provider approves. Ask your health care provider if you may take showers. You may only be allowed to take sponge baths. If you were given a sedative during the procedure, it can affect you for several hours. Do not drive or operate machinery until your health care provider says that it is safe. Wear a medical alert bracelet in case of an emergency. This will tell any health care providers that you have a port. Keep all follow-up visits. This is important. Contact a health care provider if: You cannot flush your port with saline as directed, or you cannot  draw blood from the port. You have a fever or chills. You have redness, swelling, or pain around your port insertion site. You have fluid or blood coming from your port insertion site. Your port insertion site feels warm to the touch. You have pus or a bad smell coming from the port insertion site. Get help right away if: You have chest pain or shortness of breath. You have bleeding from your port that you cannot control. These symptoms may be an emergency. Get help right away. Call 911. Do not  wait to see if the symptoms will go away. Do not drive yourself to the hospital. Summary Take care of the port as told by your health care provider. Keep the manufacturer's information card with you at all times. Change your dressing as told by your health care provider. Contact a health care provider if you have a fever or chills or if you have redness, swelling, or pain around your port insertion site. Keep all follow-up visits. This information is not intended to replace advice given to you by your health care provider. Make sure you discuss any questions you have with your health care provider. Document Revised: 04/02/2021 Document Reviewed: 04/02/2021 [employer]vier Patient Education  2023 [employer]    Moderate Conscious Sedation  Adult  Care After (English)  After the procedure, it is common to have: Sleepiness for a few hours. Impaired judgment for a few hours. Trouble with balance. Nausea or vomiting if you eat too soon. Follow these instructions at home: For the time period you were told by your health care provider:  Rest. Do not participate in activities where you could fall or become injured. Do not drive or use machinery. Do not drink alcohol. Do not take sleeping pills or medicines that cause drowsiness. Do not make important decisions or sign legal documents. Do not take care of children on your own. Eating and drinking Follow instructions from your health care provider about what you may eat and drink. Drink enough fluid to keep your urine pale yellow. If you vomit: Drink clear fluids slowly and in small amounts as you are able. Clear fluids include water, ice chips, low-calorie sports drinks, and fruit juice that has water added to it (diluted fruit juice). Eat light and bland foods in small amounts as you are able. These foods include bananas, applesauce, rice, lean meats, toast, and crackers. General instructions Take over-the-counter and prescription medicines  only as told by your health care provider. Have a responsible adult stay with you for the time you are told. Do not use any products that contain nicotine or tobacco. These products include cigarettes, chewing tobacco, and vaping devices, such as e-cigarettes. If you need help quitting, ask your health care provider. Return to your normal activities as told by your health care provider. Ask your health care provider what activities are safe for you. Your health care provider may give you more instructions. Make sure you know what you can and cannot do. Contact a health care provider if: You are still sleepy or having trouble with balance after 24 hours. You feel light-headed. You vomit every time you eat or drink. You get a rash. You have a fever. You have redness or swelling around the IV site. Get help right away if: You have trouble breathing. You start to feel confused at home. These symptoms may be an emergency. Get help right away. Call 911. Do not wait to see if the symptoms will go away. Do not  drive yourself to the hospital. This information is not intended to replace advice given to you by your health care provider. Make sure you discuss any questions you have with your health care provider.

## 2024-10-27 ENCOUNTER — Inpatient Hospital Stay (HOSPITAL_BASED_OUTPATIENT_CLINIC_OR_DEPARTMENT_OTHER): Admitting: Hematology and Oncology

## 2024-10-27 ENCOUNTER — Inpatient Hospital Stay: Admitting: Hematology and Oncology

## 2024-10-27 ENCOUNTER — Encounter: Payer: Self-pay | Admitting: Hematology and Oncology

## 2024-10-27 VITALS — BP 152/75 | HR 86 | Temp 98.0°F | Resp 16 | Wt 202.5 lb

## 2024-10-27 DIAGNOSIS — C8338 Diffuse large B-cell lymphoma, lymph nodes of multiple sites: Secondary | ICD-10-CM

## 2024-10-27 MED ORDER — DOXORUBICIN HCL CHEMO IV INJECTION 2 MG/ML
50.0000 mg/m2 | Freq: Once | INTRAVENOUS | Status: AC
  Administered 2024-10-27: 104 mg via INTRAVENOUS
  Filled 2024-10-27: qty 52

## 2024-10-27 MED ORDER — SODIUM CHLORIDE 0.9 % IV SOLN
750.0000 mg/m2 | Freq: Once | INTRAVENOUS | Status: AC
  Administered 2024-10-27: 1500 mg via INTRAVENOUS
  Filled 2024-10-27: qty 75

## 2024-10-27 MED ORDER — SODIUM CHLORIDE 0.9 % IV SOLN
150.0000 mg | Freq: Once | INTRAVENOUS | Status: AC
  Administered 2024-10-27: 150 mg via INTRAVENOUS
  Filled 2024-10-27: qty 150

## 2024-10-27 MED ORDER — SODIUM CHLORIDE 0.9 % IV SOLN: INTRAVENOUS | Status: DC

## 2024-10-27 MED ORDER — SODIUM CHLORIDE 0.9 % IV SOLN
375.0000 mg/m2 | Freq: Once | INTRAVENOUS | Status: AC
  Administered 2024-10-27: 800 mg via INTRAVENOUS
  Filled 2024-10-27: qty 50

## 2024-10-27 MED ORDER — PALONOSETRON HCL INJECTION 0.25 MG/5ML
0.2500 mg | Freq: Once | INTRAVENOUS | Status: AC
  Administered 2024-10-27: 0.25 mg via INTRAVENOUS
  Filled 2024-10-27: qty 5

## 2024-10-27 MED ORDER — ACETAMINOPHEN 325 MG PO TABS
650.0000 mg | ORAL_TABLET | Freq: Once | ORAL | Status: AC
  Administered 2024-10-27: 650 mg via ORAL
  Filled 2024-10-27: qty 2

## 2024-10-27 MED ORDER — DIPHENHYDRAMINE HCL 25 MG PO CAPS
25.0000 mg | ORAL_CAPSULE | Freq: Once | ORAL | Status: AC
  Administered 2024-10-27: 25 mg via ORAL
  Filled 2024-10-27: qty 1

## 2024-10-27 MED ORDER — SODIUM CHLORIDE 0.9 % IV SOLN
1.8000 mg/kg | Freq: Once | INTRAVENOUS | Status: AC
  Administered 2024-10-27: 170 mg via INTRAVENOUS
  Filled 2024-10-27: qty 7

## 2024-10-27 NOTE — Patient Instructions (Addendum)
 [facility] - A DEPT OF [facility]  Discharge Instructions: Thank you for choosing [facility] to provide your oncology and hematology care.   If you have a lab appointment with the Cancer Center, please go directly to the Cancer Center and check in at the registration area.   Wear comfortable clothing and clothing appropriate for easy access to any Portacath or PICC line.   We strive to give you quality time with your provider. You may need to reschedule your appointment if you arrive late (15 or more minutes).  Arriving late affects you and other patients whose appointments are after yours.  Also, if you miss three or more appointments without notifying the office, you may be dismissed from the clinic at the providers discretion.      For prescription refill requests, have your pharmacy contact our office and allow 72 hours for refills to be completed.    Today you received the following chemotherapy and/or immunotherapy agents Rituximab , Polivy , Doxorubicin , Cytoxan       To help prevent nausea and vomiting after your treatment, we encourage you to take your nausea medication as directed.  BELOW ARE SYMPTOMS THAT SHOULD BE REPORTED IMMEDIATELY: *FEVER GREATER THAN 100.4 F (38 C) OR HIGHER *CHILLS OR SWEATING *NAUSEA AND VOMITING THAT IS NOT CONTROLLED WITH YOUR NAUSEA MEDICATION *UNUSUAL SHORTNESS OF BREATH *UNUSUAL BRUISING OR BLEEDING *URINARY PROBLEMS (pain or burning when urinating, or frequent urination) *BOWEL PROBLEMS (unusual diarrhea, constipation, pain near the anus) TENDERNESS IN MOUTH AND THROAT WITH OR WITHOUT PRESENCE OF ULCERS (sore throat, sores in mouth, or a toothache) UNUSUAL RASH, SWELLING OR PAIN  UNUSUAL VAGINAL DISCHARGE OR ITCHING   Items with * indicate a potential emergency and should be followed up as soon as possible or go to the Emergency Department if any problems should occur.  Please show the CHEMOTHERAPY  ALERT CARD or IMMUNOTHERAPY ALERT CARD at check-in to the Emergency Department and triage nurse.  Should you have questions after your visit or need to cancel or reschedule your appointment, please contact [facility] - A DEPT OF [facility]  Dept: [phone]  and follow the prompts.  Office hours are 8:00 a.m. to 4:30 p.m. Monday - Friday. Please note that voicemails left after 4:00 p.m. may not be returned until the following business day.  We are closed weekends and major holidays. You have access to a nurse at all times for urgent questions. Please call the main number to the clinic Dept: [phone] and follow the prompts.   For any non-urgent questions, you may also contact your provider using MyChart. We now offer e-Visits for anyone 57 and older to request care online for non-urgent symptoms. For details visit [email or web address].   Also download the MyChart app! Go to the app store, search MyChart, open the app, select [facility], and log in with your MyChart username and password.  Polatuzumab Vedotin  Injection What is this medication? POLATUZUMAB VEDOTIN  (poe la tooz ue mab ve doe tin) treats lymphoma. It works by blocking a protein that causes cancer cells to grow and multiply. This helps to slow or stop the spread of cancer cells. This medicine may be used for other purposes; ask your health care provider or pharmacist if you have questions. COMMON BRAND NAME(S): Polivy  What should I tell my care team before I take this medication? They need to know if you have any of these  conditions: Infection, such as chickenpox, cold sores, herpes Liver disease Low blood cell levels, such as white cells, red cells, platelets Tingling of the fingers or toes or other nerve disorder An unusual or allergic reaction to polatuzumab vedotin , other medications, foods, dyes, or preservatives If you or your partner are pregnant or trying to get  pregnant Breast-feeding How should I use this medication? This medication is infused into a vein. It is given by your care team in a hospital or clinic setting. Talk to your care team about the use of this medication in children. Special care may be needed. Overdosage: If you think you have taken too much of this medicine contact a poison control center or emergency room at once. NOTE: This medicine is only for you. Do not share this medicine with others. What if I miss a dose? Keep appointments for follow-up doses. It is important not to miss your dose. Call your care team if you are unable to keep an appointment. What may interact with this medication? Certain antibiotics, such as clarithromycin, telithromycin Certain antivirals for HIV or AIDS Certain medications for fungal infections, such as ketoconazole, itraconazole, posaconazole, voriconazole Certain medications for seizures, such as carbamazepine, phenobarbital, phenytoin This list may not describe all possible interactions. Give your health care provider a list of all the medicines, herbs, non-prescription drugs, or dietary supplements you use. Also tell them if you smoke, drink alcohol, or use illegal drugs. Some items may interact with your medicine. What should I watch for while using this medication? Your condition will be monitored carefully while you are receiving this medication. This medication may make you feel generally unwell. This is not uncommon as chemotherapy can affect healthy cells as well as cancer cells. Report any side effects. Continue your course of treatment even though you feel ill unless your care team tells you to stop. You may need blood work while taking this medication. This medication may increase your risk of getting an infection. Call your care team for advice if you get a fever, chills, sore throat, or other symptoms of a cold or flu. Do not treat yourself. Try to avoid being around people who are  sick. This medication may increase your risk to bruise or bleed. Call your care team if you notice any unusual bleeding. In some patients, this medication may cause a serious brain infection that may cause death. If you have any problems seeing, thinking, speaking, walking, or standing, tell your care team right away. If you cannot reach your care team, urgently seek other source of medical care. Talk to your care team if you wish to become pregnant or think you might be pregnant. This medication can cause serious birth defects if taken during pregnancy or for 3 months after the last dose.  A negative pregnancy test is required before starting this medication. A reliable form of contraception is recommended while taking this medication and for 3 months after the last dose. Talk to your care team about effective forms of contraception. Do not father a child while taking this medication or for 5 months after the last dose. Use a condom while having sex during this time period. Do not breastfeed while taking this medication and for 2 months after the last dose. This medication may cause infertility. Talk to your care team if you are concerned about your fertility. What side effects may I notice from receiving this medication? Side effects that you should report to your care team as soon as possible:  Allergic reactions--skin rash, itching, hives, swelling of the face, lips, tongue, or throat Dizziness, loss of balance or coordination, confusion or trouble speaking Infection--fever, chills, cough, sore throat, wounds that don't heal, pain or trouble when passing urine, general feeling of discomfort or being unwell Infusion reactions--chest pain, shortness of breath or trouble breathing, feeling faint or lightheaded Liver injury--right upper belly pain, loss of appetite, nausea, light-colored stool, dark yellow or brown urine, yellowing skin or eyes, unusual weakness or fatigue Low red blood cell  level--unusual weakness or fatigue, dizziness, headache, trouble breathing Pain, tingling, or numbness in the hands or feet Stomach pain, unusual weakness or fatigue, nausea, vomiting, diarrhea, or fever that lasts longer than expected Tumor lysis syndrome (TLS)--nausea, vomiting, diarrhea, decrease in the amount of urine, dark urine, unusual weakness or fatigue, confusion, muscle pain or cramps, fast or irregular heartbeat, joint pain Unusual bruising or bleeding Side effects that usually do not require medical attention (report these to your care team if they continue or are bothersome): Diarrhea Dizziness Loss of appetite Vomiting Weight loss This list may not describe all possible side effects. Call your doctor for medical advice about side effects. You may report side effects to FDA at 1-800-FDA-1088. Where should I keep my medication? This medication is given in a hospital or clinic. It will not be stored at home. NOTE: This sheet is a summary. It may not cover all possible information. If you have questions about this medicine, talk to your doctor, pharmacist, or health care provider.  2024 Elsevier/Gold Standard (2022-02-05 00:00:00)  Doxorubicin  Injection What is this medication? DOXORUBICIN  (dox oh ROO bi sin) treats some types of cancer. It works by slowing down the growth of cancer cells. This medicine may be used for other purposes; ask your health care provider or pharmacist if you have questions. COMMON BRAND NAME(S): Adriamycin , Adriamycin  PFS, Adriamycin  RDF, Rubex  What should I tell my care team before I take this medication? They need to know if you have any of these conditions: Heart disease History of low blood cell levels caused by a medication Liver disease Recent or ongoing radiation An unusual or allergic reaction to doxorubicin , other medications, foods, dyes, or preservatives If you or your partner are pregnant or trying to get pregnant Breast-feeding How  should I use this medication? This medication is injected into a vein. It is given by your care team in a hospital or clinic setting. Talk to your care team about the use of this medication in children. Special care may be needed. Overdosage: If you think you have taken too much of this medicine contact a poison control center or emergency room at once. NOTE: This medicine is only for you. Do not share this medicine with others. What if I miss a dose? Keep appointments for follow-up doses. It is important not to miss your dose. Call your care team if you are unable to keep an appointment. What may interact with this medication? 6-mercaptopurine Paclitaxel Phenytoin St. John's wort Trastuzumab Verapamil This list may not describe all possible interactions. Give your health care provider a list of all the medicines, herbs, non-prescription drugs, or dietary supplements you use. Also tell them if you smoke, drink alcohol, or use illegal drugs. Some items may interact with your medicine. What should I watch for while using this medication? Your condition will be monitored carefully while you are receiving this medication. You may need blood work while taking this medication. This medication may make you feel generally unwell. This  is not uncommon as chemotherapy can affect healthy cells as well as cancer cells. Report any side effects. Continue your course of treatment even though you feel ill unless your care team tells you to stop. There is a maximum amount of this medication you should receive throughout your life. The amount depends on the medical condition being treated and your overall health. Your care team will watch how much of this medication you receive. Tell your care team if you have taken this medication before. Your urine may turn red for a few days after your dose. This is not blood. If your urine is dark or brown, call your care team. In some cases, you may be given additional  medications to help with side effects. Follow all directions for their use. This medication may increase your risk of getting an infection. Call your care team for advice if you get a fever, chills, sore throat, or other symptoms of a cold or flu. Do not treat yourself. Try to avoid being around people who are sick. This medication may increase your risk to bruise or bleed. Call your care team if you notice any unusual bleeding. Talk to your care team about your risk of cancer. You may be more at risk for certain types of cancers if you take this medication. Talk to your care team if you or your partner may be pregnant. Serious birth defects can occur if you take this medication during pregnancy and for 6 months after the last dose. Contraception is recommended while taking this medication and for 6 months after the last dose. Your care team can help you find the option that works for you. If your partner can get pregnant, use a condom while taking this medication and for 6 months after the last dose. Do not breastfeed while taking this medication. This medication may cause infertility. Talk to your care team if you are concerned about your fertility. What side effects may I notice from receiving this medication? Side effects that you should report to your care team as soon as possible: Allergic reactions--skin rash, itching, hives, swelling of the face, lips, tongue, or throat Heart failure--shortness of breath, swelling of the ankles, feet, or hands, sudden weight gain, unusual weakness or fatigue Heart rhythm changes--fast or irregular heartbeat, dizziness, feeling faint or lightheaded, chest pain, trouble breathing Infection--fever, chills, cough, sore throat, wounds that don't heal, pain or trouble when passing urine, general feeling of discomfort or being unwell Low red blood cell level--unusual weakness or fatigue, dizziness, headache, trouble breathing Painful swelling, warmth, or redness of  the skin, blisters or sores at the infusion site Unusual bruising or bleeding Side effects that usually do not require medical attention (report to your care team if they continue or are bothersome): Diarrhea Hair loss Nausea Pain, redness, or swelling with sores inside the mouth or throat Red urine This list may not describe all possible side effects. Call your doctor for medical advice about side effects. You may report side effects to FDA at 1-800-FDA-1088. Where should I keep my medication? This medication is given in a hospital or clinic. It will not be stored at home. NOTE: This sheet is a summary. It may not cover all possible information. If you have questions about this medicine, talk to your doctor, pharmacist, or health care provider.  2024 Elsevier/Gold Standard (2023-01-01 00:00:00)  Cyclophosphamide  Injection What is this medication? CYCLOPHOSPHAMIDE  (sye kloe FOSS fa mide) treats some types of cancer. It works by slowing down the  growth of cancer cells. This medicine may be used for other purposes; ask your health care provider or pharmacist if you have questions. COMMON BRAND NAME(S): Cyclophosphamide , Cytoxan , Neosar  What should I tell my care team before I take this medication? They need to know if you have any of these conditions: Heart disease Irregular heartbeat or rhythm Infection Kidney problems Liver disease Low blood cell levels (white cells, platelets, or red blood cells) Lung disease Previous radiation Trouble passing urine An unusual or allergic reaction to cyclophosphamide , other medications, foods, dyes, or preservatives Pregnant or trying to get pregnant Breast-feeding How should I use this medication? This medication is injected into a vein. It is given by your care team in a hospital or clinic setting. Talk to your care team about the use of this medication in children. Special care may be needed. Overdosage: If you think you have taken too much  of this medicine contact a poison control center or emergency room at once. NOTE: This medicine is only for you. Do not share this medicine with others. What if I miss a dose? Keep appointments for follow-up doses. It is important not to miss your dose. Call your care team if you are unable to keep an appointment. What may interact with this medication? Amphotericin B Amiodarone Azathioprine Certain antivirals for HIV or hepatitis Certain medications for blood pressure, such as enalapril, lisinopril, quinapril Cyclosporine Diuretics Etanercept Indomethacin Medications that relax muscles Metronidazole Natalizumab Tamoxifen Warfarin This list may not describe all possible interactions. Give your health care provider a list of all the medicines, herbs, non-prescription drugs, or dietary supplements you use. Also tell them if you smoke, drink alcohol, or use illegal drugs. Some items may interact with your medicine. What should I watch for while using this medication? This medication may make you feel generally unwell. This is not uncommon as chemotherapy can affect healthy cells as well as cancer cells. Report any side effects. Continue your course of treatment even though you feel ill unless your care team tells you to stop. You may need blood work while you are taking this medication. This medication may increase your risk of getting an infection. Call your care team for advice if you get a fever, chills, sore throat, or other symptoms of a cold or flu. Do not treat yourself. Try to avoid being around people who are sick. Avoid taking medications that contain aspirin, acetaminophen , ibuprofen, naproxen, or ketoprofen unless instructed by your care team. These medications may hide a fever. Be careful brushing or flossing your teeth or using a toothpick because you may get an infection or bleed more easily. If you have any dental work done, tell your dentist you are receiving this  medication. Drink water or other fluids as directed. Urinate often, even at night. Some products may contain alcohol. Ask your care team if this medication contains alcohol. Be sure to tell all care teams you are taking this medicine. Certain medicines, like metronidazole and disulfiram, can cause an unpleasant reaction when taken with alcohol. The reaction includes flushing, headache, nausea, vomiting, sweating, and increased thirst. The reaction can last from 30 minutes to several hours. Talk to your care team if you wish to become pregnant or think you might be pregnant. This medication can cause serious birth defects if taken during pregnancy and for 1 year after the last dose. A negative pregnancy test is required before starting this medication. A reliable form of contraception is recommended while taking this medication and for  1 year after the last dose. Talk to your care team about reliable forms of contraception. Do not father a child while taking this medication and for 4 months after the last dose. Use a condom during this time period. Do not breast-feed while taking this medication or for 1 week after the last dose. This medication may cause infertility. Talk to your care team if you are concerned about your fertility. Talk to your care team about your risk of cancer. You may be more at risk for certain types of cancer if you take this medication. What side effects may I notice from receiving this medication? Side effects that you should report to your care team as soon as possible: Allergic reactions--skin rash, itching, hives, swelling of the face, lips, tongue, or throat Dry cough, shortness of breath or trouble breathing Heart failure--shortness of breath, swelling of the ankles, feet, or hands, sudden weight gain, unusual weakness or fatigue Heart muscle inflammation--unusual weakness or fatigue, shortness of breath, chest pain, fast or irregular heartbeat, dizziness, swelling of the  ankles, feet, or hands Heart rhythm changes--fast or irregular heartbeat, dizziness, feeling faint or lightheaded, chest pain, trouble breathing Infection--fever, chills, cough, sore throat, wounds that don't heal, pain or trouble when passing urine, general feeling of discomfort or being unwell Kidney injury--decrease in the amount of urine, swelling of the ankles, hands, or feet Liver injury--right upper belly pain, loss of appetite, nausea, light-colored stool, dark yellow or brown urine, yellowing skin or eyes, unusual weakness or fatigue Low red blood cell level--unusual weakness or fatigue, dizziness, headache, trouble breathing Low sodium level--muscle weakness, fatigue, dizziness, headache, confusion Red or dark brown urine Unusual bruising or bleeding Side effects that usually do not require medical attention (report to your care team if they continue or are bothersome): Hair loss Irregular menstrual cycles or spotting Loss of appetite Nausea Pain, redness, or swelling with sores inside the mouth or throat Vomiting This list may not describe all possible side effects. Call your doctor for medical advice about side effects. You may report side effects to FDA at 1-800-FDA-1088. Where should I keep my medication? This medication is given in a hospital or clinic. It will not be stored at home. NOTE: This sheet is a summary. It may not cover all possible information. If you have questions about this medicine, talk to your doctor, pharmacist, or health care provider.  2024 Elsevier/Gold Standard (2022-02-14 00:00:00)

## 2024-10-27 NOTE — Progress Notes (Signed)
 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 and received 6 cycles of rituximab  and cladribine  with complete response Subsequently, she had disease relapse and received single agent rituximab  with complete response and is under surveillance only Recently, she started to notice discomfort on her axilla and CT imaging confirmed multiple lymphadenopathy, biopsy came back diffuse large B-cell lymphoma CT imaging revealed abnormal lymphadenopathy and splenomegaly PET/CT imaging results are still pending but based on my own review, it appears that her spleen is involved Overall, she has clinical stage III disease FISH analysis from her tumor sample came back negative for double or triple hit lymphoma  We will proceed with chemotherapy with Pola-R-CHP treatment I plan to repeat imaging study after 2 cycles of chemotherapy to reassess We discussed prophylactic antiviral, antimicrobial and tumor lysis prophylaxis I plan to see her next week for blood count monitoring in case she need transfusion support Echocardiogram is normal   Orders Placed This Encounter  Procedures   CBC with Differential/Platelet    Standing Status:   Standing    Number of Occurrences:   22    Expiration Date:   10/27/2025   Sample to Blood Bank    Standing Status:   Standing    Number of Occurrences:   33    Expiration Date:   10/27/2025     Hicks Lonn, MD  INTERVAL HISTORY: she returns for treatment follow-up I have personally reviewed copy of her FISH analysis report and PET/CT imaging We discussed expected side effects today and reminded her to take her  antimicrobial prophylaxis and prednisone  We discussed the need for possible blood transfusion support next week  PHYSICAL EXAMINATION: ECOG PERFORMANCE STATUS: 0 - Asymptomatic  No results found for: [id number]    Latest Ref Rng & Units 10/24/2024    3:19 PM 09/09/2024    9:53 AM 03/04/2024    8:07 AM  CBC  WBC 4.0 - 10.5 K/uL 6.2  5.0  4.4   Hemoglobin 12.0 - 15.0 g/dL 87.0  88.2  86.2   Hematocrit 36.0 - 46.0 % 37.8  33.8  38.9   Platelets 150 - 400 K/uL 203  145  171       Chemistry      Component Value Date/Time   NA 139 10/24/2024 1519   NA 138 12/08/2018 0931   NA 139 01/30/2015 0924   K 4.2 10/24/2024 1519   K 4.8 01/30/2015 0924   CL 101 10/24/2024 1519   CO2 28 10/24/2024 1519   CO2 25 01/30/2015 0924   BUN 14 10/24/2024 1519   BUN 12 12/08/2018 0931   BUN 11.5 01/30/2015 0924   CREATININE 1.02 (H) 10/24/2024 1519   CREATININE 0.8 01/30/2015 0924      Component Value Date/Time   CALCIUM 9.4 10/24/2024 1519   CALCIUM 9.1 01/30/2015 0924   ALKPHOS 95 10/24/2024 1519   ALKPHOS 77 01/30/2015 0924   AST 21 10/24/2024 1519   AST 21 01/30/2015 0924   ALT 27 10/24/2024 1519   ALT 27 01/30/2015 0924   BILITOT 0.8 10/24/2024 1519   BILITOT 0.48 01/30/2015 0924       There were no vitals filed  for this visit. There were no vitals filed for this visit. Other relevant data reviewed during this visit included surgical report, PET/CT imaging, recent blood work

## 2024-10-27 NOTE — Assessment & Plan Note (Addendum)
 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 and received 6 cycles of rituximab  and cladribine  with complete response Subsequently, she had disease relapse and received single agent rituximab  with complete response and is under surveillance only Recently, she started to notice discomfort on her axilla and CT imaging confirmed multiple lymphadenopathy, biopsy came back diffuse large B-cell lymphoma CT imaging revealed abnormal lymphadenopathy and splenomegaly PET/CT imaging results are still pending but based on my own review, it appears that her spleen is involved Overall, she has clinical stage III disease FISH analysis from her tumor sample came back negative for double or triple hit lymphoma  We will proceed with chemotherapy with Pola-R-CHP treatment I plan to repeat imaging study after 2 cycles of chemotherapy to reassess We discussed prophylactic antiviral, antimicrobial and tumor lysis prophylaxis I plan to see her next week for blood count monitoring in case she need transfusion support Echocardiogram is normal

## 2024-10-28 ENCOUNTER — Ambulatory Visit: Payer: Self-pay | Admitting: *Deleted

## 2024-10-28 ENCOUNTER — Inpatient Hospital Stay

## 2024-10-28 ENCOUNTER — Encounter: Payer: Self-pay | Admitting: Hematology

## 2024-10-28 ENCOUNTER — Inpatient Hospital Stay: Admitting: Hematology and Oncology

## 2024-10-28 ENCOUNTER — Telehealth: Payer: Self-pay

## 2024-10-28 NOTE — Telephone Encounter (Signed)
-----   Message from Nurse Rivka SQUIBB, RN sent at 10/27/2024  6:43 PM EST ----- Regarding: Dr. Lonn pt first timer call back Dr. Lonn patient Ms. Lienhard completed first time Rituximab , Polivy , Doxorubicin , & Cytoxan  tx with no issues or complaints. VSS, AVS reviewed, and ambulatory to lobby to join family.

## 2024-10-29 ENCOUNTER — Inpatient Hospital Stay

## 2024-10-29 VITALS — BP 153/73 | HR 82 | Temp 97.0°F | Resp 20

## 2024-10-29 DIAGNOSIS — C8338 Diffuse large B-cell lymphoma, lymph nodes of multiple sites: Secondary | ICD-10-CM

## 2024-10-29 MED ORDER — PEGFILGRASTIM-CBQV 6 MG/0.6ML ~~LOC~~ SOSY
6.0000 mg | PREFILLED_SYRINGE | Freq: Once | SUBCUTANEOUS | Status: AC
  Administered 2024-10-29: 6 mg via SUBCUTANEOUS
  Filled 2024-10-29: qty 0.6

## 2024-11-01 ENCOUNTER — Telehealth: Payer: Self-pay

## 2024-11-01 NOTE — Telephone Encounter (Signed)
 Spoke with pt regarding her FMLA/Disability form being completed. Pt verbalized understanding.

## 2024-11-02 ENCOUNTER — Telehealth: Payer: Self-pay

## 2024-11-02 NOTE — Telephone Encounter (Signed)
 Patient called to report onset of stomach spasms. She denies diarrhea and reports drinking 64 oz of water daily. She is taking her nausea medication as prescribed. She reports frequent urgency to use the bathroom but no loose stools. Patient is asking if there is anything else she should be taking.

## 2024-11-03 ENCOUNTER — Telehealth: Payer: Self-pay

## 2024-11-03 ENCOUNTER — Encounter: Payer: Self-pay | Admitting: Hematology and Oncology

## 2024-11-03 NOTE — Telephone Encounter (Signed)
 Returned her call and told her Dr. Lonn reviewed mychart message. Reinforced need to keep appt tomorrow in case blood needed. She will keep appts. She is eating small meals x 3 a day. Instructed to eat 6 small meals a day and increase water intake. She will try tum's for possible reflux and OTC medication. She will call the office back for questions/cocerns.

## 2024-11-04 ENCOUNTER — Inpatient Hospital Stay

## 2024-11-04 ENCOUNTER — Inpatient Hospital Stay: Admitting: Hematology and Oncology

## 2024-11-04 ENCOUNTER — Encounter: Payer: Self-pay | Admitting: Hematology and Oncology

## 2024-11-04 VITALS — BP 141/84 | HR 111 | Temp 97.7°F | Resp 18 | Wt 196.2 lb

## 2024-11-04 DIAGNOSIS — D61818 Other pancytopenia: Secondary | ICD-10-CM

## 2024-11-04 DIAGNOSIS — C8338 Diffuse large B-cell lymphoma, lymph nodes of multiple sites: Secondary | ICD-10-CM

## 2024-11-04 DIAGNOSIS — R12 Heartburn: Secondary | ICD-10-CM | POA: Diagnosis not present

## 2024-11-04 DIAGNOSIS — E860 Dehydration: Secondary | ICD-10-CM

## 2024-11-04 LAB — CBC WITH DIFFERENTIAL/PLATELET
Abs Immature Granulocytes: 0 K/uL (ref 0.00–0.07)
Basophils Absolute: 0 K/uL (ref 0.0–0.1)
Basophils Relative: 0 %
Eosinophils Absolute: 0.1 K/uL (ref 0.0–0.5)
Eosinophils Relative: 11 %
HCT: 34 % — ABNORMAL LOW (ref 36.0–46.0)
Hemoglobin: 12.1 g/dL (ref 12.0–15.0)
Immature Granulocytes: 0 %
Lymphocytes Relative: 57 %
Lymphs Abs: 0.3 K/uL — ABNORMAL LOW (ref 0.7–4.0)
MCH: 28.5 pg (ref 26.0–34.0)
MCHC: 35.6 g/dL (ref 30.0–36.0)
MCV: 80 fL (ref 80.0–100.0)
Monocytes Absolute: 0.1 K/uL (ref 0.1–1.0)
Monocytes Relative: 17 %
Neutro Abs: 0.1 K/uL — CL (ref 1.7–7.7)
Neutrophils Relative %: 15 %
Platelets: 111 K/uL — ABNORMAL LOW (ref 150–400)
RBC: 4.25 MIL/uL (ref 3.87–5.11)
RDW: 13.2 % (ref 11.5–15.5)
Smear Review: NORMAL
WBC: 0.5 K/uL — CL (ref 4.0–10.5)
nRBC: 0 % (ref 0.0–0.2)

## 2024-11-04 LAB — SAMPLE TO BLOOD BANK

## 2024-11-04 LAB — URIC ACID: Uric Acid, Serum: 4 mg/dL (ref 2.5–7.1)

## 2024-11-04 LAB — LACTATE DEHYDROGENASE: LDH: 161 U/L (ref 105–235)

## 2024-11-04 MED ORDER — SODIUM CHLORIDE 0.9 % IV SOLN: Freq: Once | INTRAVENOUS | Status: AC

## 2024-11-04 NOTE — Assessment & Plan Note (Addendum)
 She has frequent heartburn She will continue omeprazole over-the-counter along with calcium carbonate chew as tolerated

## 2024-11-04 NOTE — Assessment & Plan Note (Addendum)
 She has lost weight and appears dehydrated I recommend IV fluid support today

## 2024-11-04 NOTE — Assessment & Plan Note (Addendum)
 This is due to treatment She does not need transfusion support We discussed neutropenic precaution She will continue antimicrobial prophylaxis with Bactrim  and acyclovir 

## 2024-11-04 NOTE — Assessment & Plan Note (Addendum)
 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 and received 6 cycles of rituximab  and cladribine  with complete response Subsequently, she had disease relapse and received single agent rituximab  with complete response and is under surveillance only Recently, she started to notice discomfort on her axilla and CT imaging confirmed multiple lymphadenopathy, biopsy came back diffuse large B-cell lymphoma CT imaging revealed abnormal lymphadenopathy and splenomegaly PET/CT imaging results confirmed disease relapse with spleen involvement, overall stage III Overall, she has clinical stage III disease FISH analysis from her tumor sample came back negative for double or triple hit lymphoma  She tolerated cycle 1 of treatment poorly complicated by severe frequent bowel movement, dehydration, weight loss and heartburn She has lost some weight I recommend IV fluid support and I will readjust the dose of her treatment based on today's weight and dose reduction of doxorubicin  and cyclophosphamide  moving forward She does not need transfusion support today

## 2024-11-04 NOTE — Patient Instructions (Signed)
 Fluids Given Through an IV (IV Infusion Therapy): What to Expect IV infusion therapy is a treatment to deliver a fluid, called an infusion, into a vein. You may have IV infusion to get: Fluids. Medicines. Nutrition. Chemotherapy. This is medicines to stop or slow down cancer cells. Blood or blood products. Dye that is given before an MRI or a CT scan. This is called contrast dye. Tell a health care provider about: Any allergies you have. This includes allergies to anesthesia or dyes. All medicines you take. These include vitamins, herbs, eye drops, and creams. Any bleeding problems you have. Any surgeries you've had, including if you've had lymph nodes taken out of your armpit or if you have a arteriovenous fistula for dialysis. Any medical problems you have. Whether you're pregnant or may be pregnant. Whether you've used IV drugs. What are the risks? Your health care provider will talk with you about risks. These may include: Pain, bruising, or bleeding. Infection. The IV leaking or moving out of place. Damage to blood vessels or nerves. Allergic reactions to medicines or dyes. A blood clot. An air bubble in the vein, also called an air embolism. What happens before the procedure? Eat and drink only as you've been told. Ask about changing or stopping: Any medicines you take. Any vitamins, herbs, or supplements you take. What happens during the procedure?     Placing the catheter Your skin at the IV site will be washed with fluid that kills germs. This will help prevent infection. IV infusion therapy starts with a procedure to place a soft tube called a catheter into a vein. An IV tube will be attached to the catheter to let the infusion flow into your blood. Your catheter may be placed: Into a vein that is usually in the bend of the elbow, forearm, or back of the hand. This is called a peripheral IV catheter. This may need to be put into a vein each time you get an  infusion. Into a vein near your elbow. This is called a midline catheter or a peripherally inserted central catheter (PICC). These types of catheters may stay in place for weeks or months at a time so you can receive repeated infusions through it. Into a vein near your neck that leads to your heart. This is called a non-tunneled catheter. This is only used for short amounts of time because it can cause infection. Through the skin of your chest and into a large vein that leads to your heart. This is called a tunneled catheter. This may stay in your body for months or years. Into an implanted port. An implanted port is a device that is surgically inserted under the skin of the chest to provide long-term IV access. The catheter will connect the port to a large vein in the chest or upper arm. A port may be kept in place for many months or years. Each time you have an infusion, a needle will be inserted through your skin to connect the catheter to the port. Doing the infusion To start the infusion, your provider will: Attach the IV tubing to your catheter. Use a tape or a bandage to hold the IV in place against your skin. An IV pump may be used to control the flow of the IV infusion. During the infusion, your provider will check the area to make sure: There is no bleeding, swelling, or pain. Your IV infusion is flowing correctly. After the infusion, your provider will: Take off the bandage  or tape. Disconnect the tubing from the catheter. Remove the catheter, if you have a peripheral IV. Apply pressure over the IV insertion site to stop bleeding, then cover the area with a bandage. If you have an implanted port, PICC, non-tunneled, or tunneled catheter, your catheter may remain in place. This depends on how many times you will need treatment, your medical condition, and what type of catheter you have. These steps may vary. Ask what you can expect. What can I expect after the procedure? You may be  watched closely until you leave. This includes checking your pain level, blood pressure, heart rate, and breathing rate. Your provider will check to make sure there are no signs of infection. Follow these instructions at home: Take your medicines only as told. Change or take off your bandage as told by your provider. Ask what things are safe for you to do at home. Ask when you can go back to work or school. Do not take baths, swim, or use a hot tub until you're told it's OK. Ask if you can shower. Check your IV insertion site every day for signs of infection. Check for: Redness, swelling, or pain. Fluid or blood. If fluid or blood drains from your IV site, use your hands to press down firmly on the area for a minute or two. Doing this should stop the bleeding. Warmth. Pus or a bad smell. Contact a health care provider if: You have signs of infection around your IV site. You have fluid or blood coming from your IV site that does not stop after you put pressure to the site. You have a rash or blisters. You have itchy, red, swollen areas of skin called hives. Get help right away if: You have a fever or chills. You have chest pain. You have trouble breathing. This information is not intended to replace advice given to you by your health care provider. Make sure you discuss any questions you have with your health care provider. Document Revised: 03/24/2023 Document Reviewed: 03/24/2023 Elsevier Patient Education  2024 [employer].

## 2024-11-04 NOTE — Progress Notes (Signed)
 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 and received 6 cycles of rituximab  and cladribine  with complete response Subsequently, she had disease relapse and received single agent rituximab  with complete response and is under surveillance only Recently, she started to notice discomfort on her axilla and CT imaging confirmed multiple lymphadenopathy, biopsy came back diffuse large B-cell lymphoma CT imaging revealed abnormal lymphadenopathy and splenomegaly PET/CT imaging results confirmed disease relapse with spleen involvement, overall stage III Overall, she has clinical stage III disease FISH analysis from her tumor sample came back negative for double or triple hit lymphoma  She tolerated cycle 1 of treatment poorly complicated by severe frequent bowel movement, dehydration, weight loss and heartburn She has lost some weight I recommend IV fluid support and I will readjust the dose of her treatment based on today's weight and dose reduction of doxorubicin  and cyclophosphamide  moving forward She does not need transfusion support today Pancytopenia, acquired (HCC) This is due to treatment She does not need transfusion support We discussed neutropenic precaution She will continue antimicrobial prophylaxis with Bactrim  and acyclovir  Heartburn She has frequent heartburn She will continue omeprazole over-the-counter along with calcium carbonate chew as tolerated Dehydration She has lost weight and appears dehydrated I recommend IV fluid support today  No orders of the defined types were placed in this encounter.    Hicks Lonn,  MD  INTERVAL HISTORY: she returns for treatment follow-up Complications related to previous cycle of chemotherapy included pancytopenia,, diarrhea,, dehydration,, and weight loss, She had frequent bowel movement without major loose stool No recent fever or chills She has significant heartburn She struggled with hydration and oral intake She felt that some of the lymph nodes had resolved I reviewed PET/CT imaging with the patient and discussed strategies to help manage her toxicities  PHYSICAL EXAMINATION: ECOG PERFORMANCE STATUS: 1 - Symptomatic but completely ambulatory  No results found for: CAN125    Latest Ref Rng & Units 11/04/2024    7:42 AM 10/24/2024    3:19 PM 09/09/2024    9:53 AM  CBC  WBC 4.0 - 10.5 K/uL 0.5  6.2  5.0   Hemoglobin 12.0 - 15.0 g/dL 87.8  87.0  88.2   Hematocrit 36.0 - 46.0 % 34.0  37.8  33.8   Platelets 150 - 400 K/uL 111  203  145       Chemistry      Component Value Date/Time   NA 139 10/24/2024 1519   NA 138 12/08/2018 0931   NA 139 01/30/2015 0924   K 4.2 10/24/2024 1519   K 4.8 01/30/2015 0924   CL 101 10/24/2024 1519   CO2 28 10/24/2024 1519   CO2 25 01/30/2015 0924   BUN 14 10/24/2024 1519   BUN 12 12/08/2018 0931   BUN 11.5 01/30/2015 0924   CREATININE 1.02 (H) 10/24/2024 1519   CREATININE 0.8 01/30/2015 0924      Component Value Date/Time   CALCIUM 9.4 10/24/2024 1519   CALCIUM 9.1 01/30/2015 0924   ALKPHOS 95 10/24/2024 1519   ALKPHOS 77 01/30/2015 0924   AST 21 10/24/2024 1519   AST 21 01/30/2015 0924   ALT 27 10/24/2024 1519  ALT 27 01/30/2015 0924   BILITOT 0.8 10/24/2024 1519   BILITOT 0.48 01/30/2015 9075     I have reviewed PET/CT imaging with the patient There were no vitals filed for this visit. There were no vitals filed for this visit. Other relevant data reviewed during this visit included CBC, PET/CT imaging

## 2024-11-10 NOTE — Progress Notes (Signed)
 Rapid Infusion Rituximab  Pharmacist Evaluation  Kara Perkins is a 55 y.o. female being treated with rituximab  for DLBCL. This patient may be considered for RIR.   A pharmacist has verified the patient tolerated rituximab  infusions per the [facility] standard infusion protocol without grade 3-4 infusion reactions. The treatment plan will be updated to reflect RIR if the patient qualifies per the checklist below:   Age > 69 years old Yes   Clinically significant cardiovascular disease No   Circulating lymphocyte count < 5000/uL prior to cycle two Yes  Lab Results  Component Value Date   LYMPHSABS 0.3 (L) 11/04/2024    Prior documented grade 3-4 infusion reaction to rituximab  Yes - rigors with Rituxan  dose on 07/02/2018  Prior documented grade 1-2 infusion reaction to rituximab  (If YES, Pharmacist will confirm with Physician if patient is still a candidate for RIR) No   Previous rituximab  infusion within the past 6 months Yes   Treatment Plan updated orders to reflect RIR No    Kara Perkins does not meet the criteria for Rapid Infusion Rituximab . This patient is not going to be switched to rapid infusion rituximab .   Kara Perkins 11/10/24 3:51 PM

## 2024-11-13 ENCOUNTER — Other Ambulatory Visit: Payer: Self-pay

## 2024-11-16 MED FILL — Fosaprepitant Dimeglumine For IV Infusion 150 MG (Base Eq): INTRAVENOUS | Qty: 5 | Status: AC

## 2024-11-17 ENCOUNTER — Inpatient Hospital Stay: Attending: Hematology and Oncology

## 2024-11-17 ENCOUNTER — Encounter: Payer: Self-pay | Admitting: Hematology and Oncology

## 2024-11-17 ENCOUNTER — Inpatient Hospital Stay

## 2024-11-17 ENCOUNTER — Other Ambulatory Visit: Payer: Self-pay | Admitting: Hematology and Oncology

## 2024-11-17 ENCOUNTER — Inpatient Hospital Stay: Admitting: Hematology and Oncology

## 2024-11-17 VITALS — BP 145/72 | HR 75 | Temp 97.5°F | Resp 18 | Wt 203.5 lb

## 2024-11-17 DIAGNOSIS — R748 Abnormal levels of other serum enzymes: Secondary | ICD-10-CM

## 2024-11-17 DIAGNOSIS — C8338 Diffuse large B-cell lymphoma, lymph nodes of multiple sites: Secondary | ICD-10-CM

## 2024-11-17 DIAGNOSIS — D61818 Other pancytopenia: Secondary | ICD-10-CM

## 2024-11-17 LAB — CBC WITH DIFFERENTIAL (CANCER CENTER ONLY)
Abs Immature Granulocytes: 0.01 10*3/uL (ref 0.00–0.07)
Basophils Absolute: 0 10*3/uL (ref 0.0–0.1)
Basophils Relative: 1 %
Eosinophils Absolute: 0 10*3/uL (ref 0.0–0.5)
Eosinophils Relative: 1 %
HCT: 32.6 % — ABNORMAL LOW (ref 36.0–46.0)
Hemoglobin: 11.2 g/dL — ABNORMAL LOW (ref 12.0–15.0)
Immature Granulocytes: 0 %
Lymphocytes Relative: 10 %
Lymphs Abs: 0.3 10*3/uL — ABNORMAL LOW (ref 0.7–4.0)
MCH: 29.4 pg (ref 26.0–34.0)
MCHC: 34.4 g/dL (ref 30.0–36.0)
MCV: 85.6 fL (ref 80.0–100.0)
Monocytes Absolute: 0.2 10*3/uL (ref 0.1–1.0)
Monocytes Relative: 6 %
Neutro Abs: 2.5 10*3/uL (ref 1.7–7.7)
Neutrophils Relative %: 82 %
Platelet Count: 205 10*3/uL (ref 150–400)
RBC: 3.81 MIL/uL — ABNORMAL LOW (ref 3.87–5.11)
RDW: 18 % — ABNORMAL HIGH (ref 11.5–15.5)
WBC Count: 3 10*3/uL — ABNORMAL LOW (ref 4.0–10.5)
nRBC: 0 % (ref 0.0–0.2)

## 2024-11-17 LAB — CMP (CANCER CENTER ONLY)
ALT: 76 U/L — ABNORMAL HIGH (ref 0–44)
AST: 36 U/L (ref 15–41)
Albumin: 4.3 g/dL (ref 3.5–5.0)
Alkaline Phosphatase: 63 U/L (ref 38–126)
Anion gap: 10 (ref 5–15)
BUN: 13 mg/dL (ref 6–20)
CO2: 26 mmol/L (ref 22–32)
Calcium: 8.9 mg/dL (ref 8.9–10.3)
Chloride: 102 mmol/L (ref 98–111)
Creatinine: 0.92 mg/dL (ref 0.44–1.00)
GFR, Estimated: >60 mL/min
Glucose, Bld: 167 mg/dL — ABNORMAL HIGH (ref 70–99)
Potassium: 3.9 mmol/L (ref 3.5–5.1)
Sodium: 138 mmol/L (ref 135–145)
Total Bilirubin: 0.4 mg/dL (ref 0.0–1.2)
Total Protein: 6.2 g/dL — ABNORMAL LOW (ref 6.5–8.1)

## 2024-11-17 LAB — SAMPLE TO BLOOD BANK

## 2024-11-17 LAB — URIC ACID: Uric Acid, Serum: 3.9 mg/dL (ref 2.5–7.1)

## 2024-11-17 LAB — LACTATE DEHYDROGENASE: LDH: 209 U/L (ref 105–235)

## 2024-11-17 MED ORDER — SODIUM CHLORIDE 0.9 % IV SOLN
600.0000 mg/m2 | Freq: Once | INTRAVENOUS | Status: AC
  Administered 2024-11-17: 1220 mg via INTRAVENOUS
  Filled 2024-11-17: qty 61

## 2024-11-17 MED ORDER — SODIUM CHLORIDE 0.9 % IV SOLN: INTRAVENOUS | Status: DC

## 2024-11-17 MED ORDER — DOXORUBICIN HCL CHEMO IV INJECTION 2 MG/ML
40.0000 mg/m2 | Freq: Once | INTRAVENOUS | Status: AC
  Administered 2024-11-17: 82 mg via INTRAVENOUS
  Filled 2024-11-17: qty 41

## 2024-11-17 MED ORDER — SODIUM CHLORIDE 0.9 % IV SOLN
1.8000 mg/kg | Freq: Once | INTRAVENOUS | Status: AC
  Administered 2024-11-17: 170 mg via INTRAVENOUS
  Filled 2024-11-17: qty 7

## 2024-11-17 MED ORDER — ACETAMINOPHEN 325 MG PO TABS
650.0000 mg | ORAL_TABLET | Freq: Once | ORAL | Status: AC
  Administered 2024-11-17: 650 mg via ORAL
  Filled 2024-11-17: qty 2

## 2024-11-17 MED ORDER — PALONOSETRON HCL INJECTION 0.25 MG/5ML
0.2500 mg | Freq: Once | INTRAVENOUS | Status: AC
  Administered 2024-11-17: 0.25 mg via INTRAVENOUS
  Filled 2024-11-17: qty 5

## 2024-11-17 MED ORDER — SODIUM CHLORIDE 0.9 % IV SOLN
375.0000 mg/m2 | Freq: Once | INTRAVENOUS | Status: AC
  Administered 2024-11-17: 800 mg via INTRAVENOUS
  Filled 2024-11-17: qty 30

## 2024-11-17 MED ORDER — DIPHENHYDRAMINE HCL 25 MG PO CAPS
25.0000 mg | ORAL_CAPSULE | Freq: Once | ORAL | Status: AC
  Administered 2024-11-17: 25 mg via ORAL
  Filled 2024-11-17: qty 1

## 2024-11-17 MED ORDER — SODIUM CHLORIDE 0.9% FLUSH: 10.0000 mL | INTRAVENOUS | Status: DC | PRN

## 2024-11-17 MED ORDER — SODIUM CHLORIDE 0.9 % IV SOLN
150.0000 mg | Freq: Once | INTRAVENOUS | Status: AC
  Administered 2024-11-17: 150 mg via INTRAVENOUS
  Filled 2024-11-17: qty 150

## 2024-11-17 NOTE — Patient Instructions (Signed)
 [facility] - A DEPT OF [facility]  Discharge Instructions: Thank you for choosing [facility] to provide your oncology and hematology care.   If you have a lab appointment with the Cancer Center, please go directly to the Cancer Center and check in at the registration area.   Wear comfortable clothing and clothing appropriate for easy access to any Portacath or PICC line.   We strive to give you quality time with your provider. You may need to reschedule your appointment if you arrive late (15 or more minutes).  Arriving late affects you and other patients whose appointments are after yours.  Also, if you miss three or more appointments without notifying the office, you may be dismissed from the clinic at the providers discretion.      For prescription refill requests, have your pharmacy contact our office and allow 72 hours for refills to be completed.    Today you received the following chemotherapy and/or immunotherapy agents Rituximab , Polatuzumab vedotin -piiq, doxorubicin , cyclophosphamide .       To help prevent nausea and vomiting after your treatment, we encourage you to take your nausea medication as directed.  BELOW ARE SYMPTOMS THAT SHOULD BE REPORTED IMMEDIATELY: *FEVER GREATER THAN 100.4 F (38 C) OR HIGHER *CHILLS OR SWEATING *NAUSEA AND VOMITING THAT IS NOT CONTROLLED WITH YOUR NAUSEA MEDICATION *UNUSUAL SHORTNESS OF BREATH *UNUSUAL BRUISING OR BLEEDING *URINARY PROBLEMS (pain or burning when urinating, or frequent urination) *BOWEL PROBLEMS (unusual diarrhea, constipation, pain near the anus) TENDERNESS IN MOUTH AND THROAT WITH OR WITHOUT PRESENCE OF ULCERS (sore throat, sores in mouth, or a toothache) UNUSUAL RASH, SWELLING OR PAIN  UNUSUAL VAGINAL DISCHARGE OR ITCHING   Items with * indicate a potential emergency and should be followed up as soon as possible or go to the Emergency Department if any problems should  occur.  Please show the CHEMOTHERAPY ALERT CARD or IMMUNOTHERAPY ALERT CARD at check-in to the Emergency Department and triage nurse.  Should you have questions after your visit or need to cancel or reschedule your appointment, please contact [facility] - A DEPT OF [facility]  Dept: [phone]  and follow the prompts.  Office hours are 8:00 a.m. to 4:30 p.m. Monday - Friday. Please note that voicemails left after 4:00 p.m. may not be returned until the following business day.  We are closed weekends and major holidays. You have access to a nurse at all times for urgent questions. Please call the main number to the clinic Dept: [phone] and follow the prompts.   For any non-urgent questions, you may also contact your provider using MyChart. We now offer e-Visits for anyone 42 and older to request care online for non-urgent symptoms. For details visit [email or web address].   Also download the MyChart app! Go to the app store, search MyChart, open the app, select [place], and log in with your MyChart username and password.

## 2024-11-17 NOTE — Assessment & Plan Note (Addendum)
 Due to recent treatment She is not symptomatic We will proceed with treatment without delay She will receive G-CSF support

## 2024-11-17 NOTE — Assessment & Plan Note (Addendum)
 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 and received 6 cycles of rituximab  and cladribine  with complete response Subsequently, she had disease relapse and received single agent rituximab  with complete response and is under surveillance only Recently, she started to notice discomfort on her axilla and CT imaging confirmed multiple lymphadenopathy, biopsy came back diffuse large B-cell lymphoma CT imaging revealed abnormal lymphadenopathy and splenomegaly PET/CT imaging results confirmed disease relapse with spleen involvement, overall stage III FISH analysis from her tumor sample came back negative for double or triple hit lymphoma  She tolerated cycle 1 of treatment poorly complicated by severe frequent bowel movement, dehydration, weight loss and heartburn She has lost some weight, requiring IV fluid support  For cycle 2 today, I have plan to reduce chemo dosage of doxorubicin  and cyclophosphamide   She has great clinical response to treatment I plan to restage after cycle 3 of chemotherapy

## 2024-11-17 NOTE — Progress Notes (Signed)
 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 and received 6 cycles of rituximab  and cladribine  with complete response Subsequently, she had disease relapse and received single agent rituximab  with complete response and is under surveillance only Recently, she started to notice discomfort on her axilla and CT imaging confirmed multiple lymphadenopathy, biopsy came back diffuse large B-cell lymphoma CT imaging revealed abnormal lymphadenopathy and splenomegaly PET/CT imaging results confirmed disease relapse with spleen involvement, overall stage III FISH analysis from her tumor sample came back negative for double or triple hit lymphoma  She tolerated cycle 1 of treatment poorly complicated by severe frequent bowel movement, dehydration, weight loss and heartburn She has lost some weight, requiring IV fluid support  For cycle 2 today, I have plan to reduce chemo dosage of doxorubicin  and cyclophosphamide   She has great clinical response to treatment I plan to restage after cycle 3 of chemotherapy Pancytopenia, acquired (HCC) Due to recent treatment She is not symptomatic We will proceed with treatment without delay She will receive G-CSF support Elevated liver enzymes Due to recent treatment Observe only  No orders of the defined types were placed in this encounter.    Hicks Lonn, MD  INTERVAL HISTORY: she returns for treatment follow-up Complications related to previous cycle of chemotherapy included pancytopenia,, mucositis,, weight loss,, and abnormal liver enzymes Her mucositis has resolved She have no nausea or vomiting and is  starting to gain back some weight  PHYSICAL EXAMINATION: ECOG PERFORMANCE STATUS: 1 - Symptomatic but completely ambulatory  No results found for: CAN125    Latest Ref Rng & Units 11/17/2024    7:56 AM 11/04/2024    7:42 AM 10/24/2024    3:19 PM  CBC  WBC 4.0 - 10.5 K/uL 3.0  0.5  6.2   Hemoglobin 12.0 - 15.0 g/dL 88.7  87.8  87.0   Hematocrit 36.0 - 46.0 % 32.6  34.0  37.8   Platelets 150 - 400 K/uL 205  111  203       Chemistry      Component Value Date/Time   NA 138 11/17/2024 0756   NA 138 12/08/2018 0931   NA 139 01/30/2015 0924   K 3.9 11/17/2024 0756   K 4.8 01/30/2015 0924   CL 102 11/17/2024 0756   CO2 26 11/17/2024 0756   CO2 25 01/30/2015 0924   BUN 13 11/17/2024 0756   BUN 12 12/08/2018 0931   BUN 11.5 01/30/2015 0924   CREATININE 0.92 11/17/2024 0756   CREATININE 0.8 01/30/2015 0924      Component Value Date/Time   CALCIUM 8.9 11/17/2024 0756   CALCIUM 9.1 01/30/2015 0924   ALKPHOS 63 11/17/2024 0756   ALKPHOS 77 01/30/2015 0924   AST 36 11/17/2024 0756   AST 21 01/30/2015 0924   ALT 76 (H) 11/17/2024 0756   ALT 27 01/30/2015 0924   BILITOT 0.4 11/17/2024 0756   BILITOT 0.48 01/30/2015 0924       There were no vitals filed for this visit. There were no vitals filed for this visit. Other relevant data reviewed during this visit included CBC and CMP

## 2024-11-17 NOTE — Assessment & Plan Note (Addendum)
 Due to recent treatment Observe only

## 2024-11-19 ENCOUNTER — Inpatient Hospital Stay: Attending: Hematology and Oncology

## 2024-12-08 ENCOUNTER — Inpatient Hospital Stay

## 2024-12-08 ENCOUNTER — Inpatient Hospital Stay: Admitting: Hematology and Oncology

## 2024-12-10 ENCOUNTER — Inpatient Hospital Stay

## 2025-09-08 ENCOUNTER — Inpatient Hospital Stay: Admitting: Hematology and Oncology

## 2025-09-08 ENCOUNTER — Inpatient Hospital Stay
